# Patient Record
Sex: Male | Born: 1965 | Race: White | Hispanic: No | Marital: Single | Smoking: Current every day smoker
Health system: Southern US, Community
[De-identification: ages and names within clinical notes are randomized; demographics above are authoritative.]

## PROBLEM LIST (undated history)

## (undated) DIAGNOSIS — E119 Type 2 diabetes mellitus without complications: Secondary | ICD-10-CM

## (undated) DIAGNOSIS — J9811 Atelectasis: Secondary | ICD-10-CM

## (undated) DIAGNOSIS — I10 Essential (primary) hypertension: Secondary | ICD-10-CM

## (undated) DIAGNOSIS — G4730 Sleep apnea, unspecified: Secondary | ICD-10-CM

## (undated) DIAGNOSIS — J189 Pneumonia, unspecified organism: Secondary | ICD-10-CM

## (undated) DIAGNOSIS — E7800 Pure hypercholesterolemia, unspecified: Secondary | ICD-10-CM

## (undated) DIAGNOSIS — K5792 Diverticulitis of intestine, part unspecified, without perforation or abscess without bleeding: Secondary | ICD-10-CM

## (undated) DIAGNOSIS — R7881 Bacteremia: Secondary | ICD-10-CM

## (undated) DIAGNOSIS — D696 Thrombocytopenia, unspecified: Secondary | ICD-10-CM

## (undated) DIAGNOSIS — K922 Gastrointestinal hemorrhage, unspecified: Secondary | ICD-10-CM

## (undated) HISTORY — PX: ABDOMINAL SURGERY: SHX537

---

## 2008-12-31 ENCOUNTER — Ambulatory Visit: Payer: Self-pay | Admitting: Urology

## 2009-03-29 ENCOUNTER — Emergency Department: Payer: Self-pay

## 2011-08-16 ENCOUNTER — Inpatient Hospital Stay: Payer: Self-pay | Admitting: Internal Medicine

## 2011-08-16 LAB — HEMOGLOBIN: HGB: 14.3 g/dL (ref 13.0–18.0)

## 2011-08-16 LAB — CBC
HCT: 48.7 % (ref 40.0–52.0)
HGB: 17.3 g/dL (ref 13.0–18.0)
MCHC: 35.5 g/dL (ref 32.0–36.0)
MCV: 90 fL (ref 80–100)
Platelet: 196 10*3/uL (ref 150–440)

## 2011-08-16 LAB — PROTIME-INR: INR: 1.1

## 2011-08-16 LAB — APTT: Activated PTT: 28.5 secs (ref 23.6–35.9)

## 2011-08-16 LAB — WBC: WBC: 8.2 10*3/uL (ref 3.8–10.6)

## 2011-08-16 LAB — BASIC METABOLIC PANEL
Anion Gap: 10 (ref 7–16)
Chloride: 106 mmol/L (ref 98–107)
Co2: 25 mmol/L (ref 21–32)
Creatinine: 0.87 mg/dL (ref 0.60–1.30)
EGFR (Non-African Amer.): >60
Potassium: 4 mmol/L (ref 3.5–5.1)
Sodium: 141 mmol/L (ref 136–145)

## 2012-11-25 ENCOUNTER — Emergency Department: Payer: Self-pay

## 2012-11-25 LAB — CBC
HCT: 49.8 % (ref 40.0–52.0)
HGB: 17.8 g/dL (ref 13.0–18.0)
MCH: 32 pg (ref 26.0–34.0)
MCHC: 35.7 g/dL (ref 32.0–36.0)
MCV: 90 fL (ref 80–100)
Platelet: 188 10*3/uL (ref 150–440)
RDW: 12.6 % (ref 11.5–14.5)

## 2012-11-25 LAB — COMPREHENSIVE METABOLIC PANEL
Alkaline Phosphatase: 68 U/L (ref 50–136)
BUN: 14 mg/dL (ref 7–18)
Bilirubin,Total: 0.4 mg/dL (ref 0.2–1.0)
Calcium, Total: 8.9 mg/dL (ref 8.5–10.1)
Chloride: 107 mmol/L (ref 98–107)
Creatinine: 1 mg/dL (ref 0.60–1.30)
EGFR (African American): >60
Glucose: 135 mg/dL — ABNORMAL HIGH (ref 65–99)
Osmolality: 284 (ref 275–301)
Potassium: 3.9 mmol/L (ref 3.5–5.1)
SGOT(AST): 22 U/L (ref 15–37)
SGPT (ALT): 37 U/L (ref 12–78)
Total Protein: 7.3 g/dL (ref 6.4–8.2)

## 2012-11-25 LAB — CK TOTAL AND CKMB (NOT AT ARMC): CK, Total: 122 U/L (ref 35–232)

## 2012-11-25 LAB — URINALYSIS, COMPLETE
Bilirubin,UR: NEGATIVE
Blood: NEGATIVE
Ph: 6 (ref 4.5–8.0)
RBC,UR: 1 /HPF (ref 0–5)
Specific Gravity: 1.026 (ref 1.003–1.030)
Squamous Epithelial: NONE SEEN
WBC UR: 1 /HPF (ref 0–5)

## 2012-11-25 LAB — TROPONIN I: Troponin-I: <0.02 ng/mL

## 2014-08-14 NOTE — Consult Note (Signed)
Brief Consult Note: Diagnosis: GI bleed.   Patient was seen by consultant.   Consult note dictated.   Orders entered.   Discussed with Attending MD.   Comments: Hematochezia, most likely diverticular, clinically resolved. ? h/o hepatitis B.  Recommendations: Continue to observe for another 24 hours for any signs of recurrent bleeding.  Hepatitis serology. Patient does not want to stay and would like to go home today. Risks of early discharge such as recurrent bleeding with associated consequences were explained to him. He understands but has decided to leave and come back to ER in case of recurrent bleeding. Otherwise, he will follow with me in 2 weeks as OP. Discussed with Dr. Seth BakeV. Thanks.  Electronic Signatures: Lurline DelIftikhar, Waqas Bruhl (MD)  (Signed 26-Apr-13 15:55)  Authored: Brief Consult Note   Last Updated: 26-Apr-13 15:55 by Lurline DelIftikhar, Lj Miyamoto (MD)

## 2014-08-14 NOTE — Consult Note (Signed)
PATIENT NAME:  Timothy Cameron, Timothy Cameron MR#:  [id number] DATE OF BIRTH:  1965/11/06  DATE OF CONSULTATION:  08/16/2011  REFERRING PHYSICIAN:  Dr. Winona LegatoVaickute  CONSULTING PHYSICIAN:  Lurline DelShaukat Theadore Blunck, MD  REASON FOR CONSULTATION: Hematochezia.   HISTORY OF PRESENT ILLNESS: 49 year old male without significant past medical history. According to the patient he was in his normal state of health until about midnight last night when he started to have some rumbling and grumbling in his stomach. He went to bathroom and passed significant amount of bright red blood. He had 4 or 5 bowel movements at home. All were reported as bloody with fresh red blood and eventually some small clots. Felt somewhat weak and dizzy. No nausea, vomiting, or hematemesis. No melena was reported. He presented to the Emergency Room a couple of hours later where his vitals were stable. According to the patient fro the last 8 to 10 hours he has not had any further bowel movements and no bleeding. According to him, he had similar episode about five years ago and underwent a colonoscopy at [facility]. He is not sure about the diagnosis at that time. He has not had any problem in between these two episodes. Patient denies using any nonsteroidals or blood thinners. No other significant symptoms were reported by the patient.   PAST MEDICAL HISTORY: History of bleeding as mentioned above in the past, otherwise quite unremarkable.   PAST SURGICAL HISTORY: Significant for some sort of abdominal surgery with [facility] the past [facility]. He is not sure of the exact nature of the problem. There is also some question of him being positive for hepatitis B, although that is not confirmed as well. Patient apparently had [facility] several years ago, but according to him nothing was said after the liver biopsy.   ALLERGIES: None.   MEDICATIONS: He takes a stool softener.   SOCIAL HISTORY: He is a Teaching laboratory techniciancar mechanic. He smokes  cigars and drinks alcohol only occasionally.    PHYSICAL EXAMINATION:  GENERAL: Well built male does not appear to be in any acute distress. Clinically does not appear to be anemic or jaundiced.  VITAL SIGNS: Heart rate is in 70s and 80s.    NECK: Neck veins are flat.   LUNGS: Clear to auscultation bilaterally with fair air entry and no added sounds.   CARDIOVASCULAR: Regular rate and rhythm. No gallops or murmur.   ABDOMEN: Soft and benign. Bowel sounds positive. Nontender, nondistended. No rebound or guarding was noted.   EXTREMITIES: No edema, clubbing, or cyanosis.   NEUROLOGIC: Examination appears to be unremarkable.   LABORATORY, DIAGNOSTIC AND RADIOLOGICAL DATA: On admission his hemoglobin was reported to be 17 probably due to hemoconcentration, with hydration his hemoglobin is still normal at 14.5. White cell count was elevated at 18,000 which was also thought to be secondary to hemoconcentration. PT-INR within normal limits. Chemistries are fine. BUN and creatinine normal.   ASSESSMENT AND PLAN: Patient with hematochezia, most likely diverticular bleeding. Clinically the bleeding seems to have resolved. He is hemodynamically stable. There are no clinical signs of active bleeding at this point. Patient's hemoglobin and hematocrit remain stable. White cell count was elevated, although this appears to be secondary to hemoconcentration as he is otherwise afebrile and abdominal examination is benign. Nuclear medicine packed RBC study was requested and done which is negative. As mentioned above, most likely we are dealing with a case of diverticular bleeding that has clinically resolved. Since patient's  last colonoscopy was more than five years ago I would recommend a repeat colonoscopy after recovery from this current episode. I would recommend that we continue to observe him overnight for any signs of recurrent bleeding and follow his hemoglobin and hematocrit. Patient is adamant about  going home today. The risk of going home too early such as recurrent bleeding which can sometimes be catastrophic were discussed with him in detail. He is in full understanding but has made a decision to go home. Patient promises that he will return to the Emergency Room at the earliest sign of any recurrent bleeding and will follow with me in two weeks if there is no further bleeding for further work-up. There is questionable history of hepatitis B in the past. This will be further evaluated by repeating his serologies today. Patient will follow back with me in two weeks. Orders have been written. Case has been discussed with Dr. Winona Legato and Dr. Winona Legato will decide about his discharge after discussing it further with the patient.   Thank you so much Dr. Winona Legato for involving me in the care of Timothy Cameron.   ____________________________ Lurline Del, MD si:cms D: 08/16/2011 16:01:54 ET T: 08/16/2011 17:02:14 ET JOB#: [id number]  cc: Lurline Del, MD, <Dictator>  Lurline Del MD ELECTRONICALLY SIGNED 08/17/2011 20:42

## 2014-08-14 NOTE — H&P (Signed)
PATIENT NAME:  Timothy Cameron, Timothy Cameron MR#:  [id number] DATE OF BIRTH:  1966/01/30  DATE OF ADMISSION:  08/16/2011  PRIMARY CARE PHYSICIAN: None.   CHIEF COMPLAINT: Bleeding per rectum.  HISTORY OF PRESENT ILLNESS: The patient is a 49 year old male with no significant past medical history. He presented with bright red bleeding per rectum since 12:00 midnight last night. He said initially it was like a profuse bleeding going on with blood clots, but now the bleeding has dwindled a little bit. He had about 7 to 8 bowel movements, mainly blood filling up the commode, since last night. He denies any abdominal pain. He denies any nausea, vomiting, or hematemesis. He denies any bleeding from any other site. He was feeling weak and dizzy. He felt as if he was going to pass out. There was mainly blood coming out, no stool with the bowel movement. He said he had a similar episode one time in the past about five years ago when he was admitted at [facility]. The patient does not remember what they told him the cause of bleed was. He said they did a colonoscopy and possibly cauterized it. He was feeling weak and dizzy as if he was going to pass out. He denies any chest pain or shortness of breath. No fever. So, he is being admitted for rectal bleed.   REVIEW OF SYSTEMS: CONSTITUTIONAL:  No fever but positive for weakness. HEENT: No acute change in vision. No headache. He is complaining of dizziness with the bleed going on. RESPIRATORY: No cough. No dyspnea. CARDIOVASCULAR:  No chest pain. GASTROINTESTINAL: No nausea or vomiting. No diarrhea. No abdominal pain, mainly hematochezia.  No melena, no hematemesis. GENITOURINARY: No dysuria. No frequency. ENDOCRINE: No thyroid problems. HEMATOLOGIC: No anemia. MUSCULOSKELETAL: No joint pains or swelling. NEUROLOGIC: No focal numbness or weakness. PSYCHIATRIC: No anxiety.   PAST MEDICAL HISTORY: History of rectal bleed in the past, he says about five years ago when he was admitted  to [facility].   PAST SURGICAL HISTORY: He had abdominal surgery with colostomy in the past, also at [facility], suspect bowel obstruction with perforation, but he is not sure of the reason of his abdominal surgery.   ALLERGIES TO MEDICATIONS: None.   HOME MEDICATIONS: He takes a stool softener, four of them at nighttime.   SOCIAL HISTORY: He works as a Teaching laboratory technician. He smokes cigars. Occasional alcohol use. No drug use.   FAMILY HISTORY: His grandfather had myocardial infarction in 58s.   PHYSICAL EXAMINATION:  VITAL SIGNS: Temperature of 98, heart rate 114, respiratory 18, blood pressure 144/85, saturating 97% on room air.   GENERAL: This is a young obese Caucasian male comfortably lying in bed, no acute distress.   HEENT: Bilateral pupils are equal. Extraocular muscles are intact. No scleral icterus. No conjunctivitis. Oral mucosa is moist. No pallor.   NECK: No thyroid tenderness, enlargement or nodule. Neck is supple. No masses, nontender. No adenopathy. No JVD. No carotid bruit.   CHEST: Bilateral breath sounds are clear. No wheeze. Normal effort. No respiratory distress.   HEART: Heart sounds are regular. No murmur. Good peripheral pulses. No lower extremity edema.   ABDOMEN: Abdomen appears to be soft. No appreciable tenderness. Normal bowel sounds. No hepatosplenomegaly. No bruit. No masses.   RECTAL: Exam is deferred.   NEUROLOGIC: He is awake, alert, oriented to time, place, and person. Cranial nerves are intact. Moving all extremities against gravity.   EXTREMITIES: No cyanosis. No clubbing.   SKIN:  At the tips of his fingers he has burn marks, mainly  because he is a Teaching laboratory techniciancar mechanic. No petechia, no purpura, no ecchymosis.   LABORATORY, DIAGNOSTIC AND RADIOLOGICAL DATA:  White count 18.2, hemoglobin of 17.3, platelet count of 48.7.  BMP: Sodium 141, potassium 4, BUN 14, creatinine 0.8, glucose is 131.  He has been typed and screened already. His INR is 1.1.    IMPRESSION:  1. Lower GI bleed, differential includes secondary to arteriovenous malformations versus polyp versus diverticular. 2. Dizziness, presyncope, secondary to rectal bleed.  3. Leukocytosis.  PLAN: A 49 year old male who is a smoker. He presented with: 1. Bright red bleeding per rectum since last night: He had 7 to 8 bowel movements, mainly bloody. No abdominal pain. No nausea, vomiting, feeling weak and dizzy with the rectal bleed. His blood pressure is stable at this time. Hemoglobin is stable. He said he had a similar episode one time in the past. The way he explains it, they did a colonoscopy and they cauterized it. It looks like he could have an arteriovenous malformation. We are going to check serial hemoglobin on him, give him IV hydration. I am going to start with a nuclear medicine bleeding scan. That will tell us if there is any active bleeding or not and maybe an approximate site of the bleeding. GI will be consulted. I already discussed the case with Dr. Niel HummerIftikhar. He has already been typed and screened. His INR and platelet count are normal.  2. Leukocytosis: May be secondary to rectal bleed. It does not appear to be infectious.   TIME SPENT:  Time spent with admission and coordination of care was 45 minutes.    ____________________________ Fredia SorrowAbhinav Hani Campusano, MD ag:cbb D: 08/16/2011 06:52:18 ET T: 08/16/2011 09:57:44 ET JOB#: [id number]  cc: Fredia SorrowAbhinav Detric Scalisi, MD, <Dictator> Fredia SorrowABHINAV Lacrecia Delval MD ELECTRONICALLY SIGNED 09/06/2011 17:09

## 2014-08-14 NOTE — Discharge Summary (Signed)
PATIENT NAME:  Timothy Cameron, Timothy Cameron MR#:  [id number] DATE OF BIRTH:  [birth year]  DATE OF ADMISSION:  08/16/2011 DATE OF DISCHARGE:  08/16/2011  ADMITTING DIAGNOSIS: Rectal bleed.  DISCHARGE DIAGNOSES:  1. Rectal bleed, subsided, likely diverticular per Gastroenterology. 2. Leukocytosis. 3. Hyperglycemia in nonfasting specimen. 4. Dizziness likely related to rectal bleed.    DISCHARGE CONDITION: Stable.   DISCHARGE MEDICATIONS: The patient is to resume his Dulcolax orally as needed.   ADDITIONAL MEDICATIONS: Senokot 1 tablet p.o. daily.  DIET: Low residue, soft for approximately 3 to 4 days, then the patient was advised to advance to regular diet as tolerated.   ACTIVITY LIMITATIONS: As tolerated.    FOLLOW-UP: Follow-up with Dr. Niel HummerIftikhar two weeks after discharge.   CONSULTANT: Dr. Niel HummerIftikhar    RADIOLOGIC STUDIES:  1. Chest, portable, single view, 08/16/2011, showed no acute cardiopulmonary disease. Small nodular density adjacent to left hilum felt to be artifactual, however, follow-up PA and lateral was recommended. 2. GI blood loss study Nuclear Medicine 08/16/2011 no significant evidence of active gastrointestinal hemorrhage noted.   REASON FOR ADMISSION: The patient is a 49 year old male with past medical history significant for history of rectal bleed in the past approximately five years ago when he was admitted to [facility]. He presented to the hospital with complaints of bleeding per rectum. Please refer to Dr. Aundria MemsAbhinav Gupta's admission note on 08/16/2011. Apparently the patient had right red bleeding per rectum since midnight on day of admission. It was quite profuse and going on with clots and then dwindled a little bit. He had a few episodes of rectal bleed. He had no nausea, vomiting, or hematemesis.   PHYSICAL EXAMINATION: His vital signs on date of admission included temperature 98, heart rate 114, respiration rate 18, blood pressure 144/85, saturation was 97% on room  air. Physical exam was unremarkable.   LAB DATA: Elevated glucose to 131. White blood cell count was elevated to 18.2. Hemoglobin was 17.3. Platelet count was 196. Coagulation panel was unremarkable.   HOSPITAL COURSE: The patient was admitted to the hospital for further evaluation. He was consulted by Dr. Niel HummerIftikhar who felt that the patient's rectal bleed, hematochezia, was likely diverticular and resolved now. He was also questioning the patient's history of Hepatitis B. He recommended to observe the patient for any signs of recurrent bleeding. However, the patient did not want to stay and wanted to go home. Risks of early discharge such as recurrent bleeding and associated concequences were explained to him and he understood but decided to leave and come back to the Emergency Room in case of recurrent bleeding. He was recommended to continue low residue diet and advance it very slowly to a regular diet over the next one week He was to return to the hospital if he has recurrent bleed. His vital signs were stable on dc home with temperature of 98.2, pulse 70, respiration rate 20, blood pressure 111/72, saturation 98% on room air at rest.   The patient's hemoglobin was checked periodically while he was in the hospital and it was stable. It decreased from his admission hemoglobin level from 17.3 to 14.3. However, admission hemoglobin level was felt to be to due to severe hemoconcentration.   The patient was complaining of possible Hepatitis B. Dr. Niel HummerIftikhar felt that the patient needs to be evaluated for Hepatitis B, however, those results were not available by the time of discharge. Now Hepatitis B surface antibody was checked and was found to be less than 0.1  which was inconsistent with immunity. Hepatitis B surface antigen was also checked and was found to be negative. Hepatitis C virus antibody reflex PCR was also checked and was found to be high at more than 11. The sample was diluted in order to obtain  Hepatitis C viral load due to insufficient specimen volume and has also reported after dilution. Hepatitis C virus antibody screen was positive and had presence of Hepatitis C virus RNA and consistent with active infection The patient will be following up with Dr. Niel Hummer for further recommendations after discharge.  In regards to leukocytosis, the patient's leukocytosis resolved and was felt to be probably stress related.   In regards to hyperglycemia, the patient should be checked as outpatient for hyperglycemia or even diabetes. He is to find a primary physician and be checked for that.   DISPOSITION: He is being discharged in stable condition with the above-mentioned medications and follow-up.   TIME SPENT: 40 minutes.   ____________________________ Katharina Caper, MD rv:drc D: 08/23/2011 20:31:50 ET T: 08/24/2011 10:50:48 ET JOB#: [id number]  cc: Katharina Caper, MD, <Dictator> Lurline Del, MD Elzia Hott MD ELECTRONICALLY SIGNED 08/31/2011 14:13

## 2016-03-08 ENCOUNTER — Observation Stay
Admission: EM | Admit: 2016-03-08 | Discharge: 2016-03-10 | Disposition: A | Discharge status: Other Acute Inpatient Hospital | Payer: Self-pay | Attending: Internal Medicine | Admitting: Internal Medicine

## 2016-03-08 ENCOUNTER — Encounter: Payer: Self-pay | Admitting: Emergency Medicine

## 2016-03-08 DIAGNOSIS — F17290 Nicotine dependence, other tobacco product, uncomplicated: Secondary | ICD-10-CM | POA: Insufficient documentation

## 2016-03-08 DIAGNOSIS — K5730 Diverticulosis of large intestine without perforation or abscess without bleeding: Secondary | ICD-10-CM | POA: Insufficient documentation

## 2016-03-08 DIAGNOSIS — D62 Acute posthemorrhagic anemia: Secondary | ICD-10-CM

## 2016-03-08 DIAGNOSIS — E119 Type 2 diabetes mellitus without complications: Secondary | ICD-10-CM

## 2016-03-08 DIAGNOSIS — E1165 Type 2 diabetes mellitus with hyperglycemia: Secondary | ICD-10-CM | POA: Insufficient documentation

## 2016-03-08 DIAGNOSIS — E1169 Type 2 diabetes mellitus with other specified complication: Secondary | ICD-10-CM

## 2016-03-08 DIAGNOSIS — Z79899 Other long term (current) drug therapy: Secondary | ICD-10-CM | POA: Insufficient documentation

## 2016-03-08 DIAGNOSIS — F1010 Alcohol abuse, uncomplicated: Secondary | ICD-10-CM | POA: Insufficient documentation

## 2016-03-08 DIAGNOSIS — D72829 Elevated white blood cell count, unspecified: Secondary | ICD-10-CM | POA: Insufficient documentation

## 2016-03-08 DIAGNOSIS — K921 Melena: Principal | ICD-10-CM | POA: Insufficient documentation

## 2016-03-08 DIAGNOSIS — D696 Thrombocytopenia, unspecified: Secondary | ICD-10-CM

## 2016-03-08 DIAGNOSIS — Z980 Intestinal bypass and anastomosis status: Secondary | ICD-10-CM | POA: Insufficient documentation

## 2016-03-08 DIAGNOSIS — K922 Gastrointestinal hemorrhage, unspecified: Secondary | ICD-10-CM | POA: Diagnosis present

## 2016-03-08 DIAGNOSIS — E871 Hypo-osmolality and hyponatremia: Secondary | ICD-10-CM | POA: Insufficient documentation

## 2016-03-08 DIAGNOSIS — Q439 Congenital malformation of intestine, unspecified: Secondary | ICD-10-CM | POA: Insufficient documentation

## 2016-03-08 DIAGNOSIS — Z716 Tobacco abuse counseling: Secondary | ICD-10-CM

## 2016-03-08 LAB — COMPREHENSIVE METABOLIC PANEL
ALBUMIN: 4.4 g/dL (ref 3.5–5.0)
ALT: 63 U/L (ref 17–63)
ANION GAP: 9 (ref 5–15)
AST: 37 U/L (ref 15–41)
Alkaline Phosphatase: 59 U/L (ref 38–126)
BUN: 8 mg/dL (ref 6–20)
CHLORIDE: 103 mmol/L (ref 101–111)
CO2: 22 mmol/L (ref 22–32)
Calcium: 9.1 mg/dL (ref 8.9–10.3)
Creatinine, Ser: 0.78 mg/dL (ref 0.61–1.24)
GFR calc Af Amer: >60 mL/min (ref >60)
GLUCOSE: 202 mg/dL — AB (ref 65–99)
POTASSIUM: 3.8 mmol/L (ref 3.5–5.1)
Sodium: 134 mmol/L — ABNORMAL LOW (ref 135–145)
TOTAL PROTEIN: 7.2 g/dL (ref 6.5–8.1)
Total Bilirubin: 0.6 mg/dL (ref 0.3–1.2)

## 2016-03-08 LAB — PROTIME-INR
INR: 1.09
PROTHROMBIN TIME: 14.1 s (ref 11.4–15.2)

## 2016-03-08 LAB — HEMOGLOBIN
HEMOGLOBIN: 13.3 g/dL (ref 13.0–18.0)
Hemoglobin: 15.2 g/dL (ref 13.0–18.0)

## 2016-03-08 LAB — TYPE AND SCREEN
ABO/RH(D): A POS
Antibody Screen: NEGATIVE

## 2016-03-08 LAB — CBC
HCT: 47.8 % (ref 40.0–52.0)
Hemoglobin: 16.5 g/dL (ref 13.0–18.0)
MCH: 31.6 pg (ref 26.0–34.0)
MCHC: 34.5 g/dL (ref 32.0–36.0)
MCV: 91.7 fL (ref 80.0–100.0)
PLATELETS: 167 10*3/uL (ref 150–440)
RBC: 5.21 MIL/uL (ref 4.40–5.90)
RDW: 12.2 % (ref 11.5–14.5)
WBC: 12.3 10*3/uL — AB (ref 3.8–10.6)

## 2016-03-08 LAB — APTT: aPTT: 27 seconds (ref 24–36)

## 2016-03-08 MED ORDER — SENNOSIDES-DOCUSATE SODIUM 8.6-50 MG PO TABS: 1.0000 | ORAL_TABLET | Freq: Every evening | ORAL | Status: DC | PRN

## 2016-03-08 MED ORDER — PEG 3350-KCL-NA BICARB-NACL 420 G PO SOLR
4000.0000 mL | Freq: Once | ORAL | Status: AC
Start: 2016-03-08 — End: 2016-03-08
  Administered 2016-03-08: 20:00:00 4000 mL via ORAL
  Filled 2016-03-08: qty 4000

## 2016-03-08 MED ORDER — ACETAMINOPHEN 325 MG PO TABS: 650.0000 mg | ORAL_TABLET | Freq: Four times a day (QID) | ORAL | Status: DC | PRN

## 2016-03-08 MED ORDER — SODIUM CHLORIDE 0.9 % IV BOLUS (SEPSIS)
1000.0000 mL | Freq: Once | INTRAVENOUS | Status: AC
  Administered 2016-03-08: 1000 mL via INTRAVENOUS

## 2016-03-08 MED ORDER — ACETAMINOPHEN 650 MG RE SUPP: 650.0000 mg | Freq: Four times a day (QID) | RECTAL | Status: DC | PRN

## 2016-03-08 MED ORDER — SODIUM CHLORIDE 0.9 % IV SOLN
INTRAVENOUS | Status: DC
  Administered 2016-03-08 – 2016-03-09 (×2): via INTRAVENOUS

## 2016-03-08 MED ORDER — ONDANSETRON HCL 4 MG/2ML IJ SOLN: 4.0000 mg | Freq: Four times a day (QID) | INTRAMUSCULAR | Status: DC | PRN

## 2016-03-08 MED ORDER — ONDANSETRON HCL 4 MG PO TABS: 4.0000 mg | ORAL_TABLET | Freq: Four times a day (QID) | ORAL | Status: DC | PRN

## 2016-03-08 MED ORDER — HYDROCODONE-ACETAMINOPHEN 5-325 MG PO TABS
1.0000 | ORAL_TABLET | ORAL | Status: DC | PRN
  Administered 2016-03-09: 12:00:00 2 via ORAL
  Filled 2016-03-08: qty 2

## 2016-03-08 NOTE — Progress Notes (Signed)
Called DR Juliene PinaMody about  bloody discharge from rectum hemoglobin of 15.2.  Per Mody no new orders, do not need to call her unless is dropping or vital signs are unstable

## 2016-03-08 NOTE — Progress Notes (Signed)
CCMD called to notify this nurse of a 2.13 second pause with HR back up to 55. Patient was symptomatic, states that he was up to have BM and after BM, he felt very weak and reports seeing spots. BP 99/65 HR 83 Oxygen 97% on RA. Last Hgb 13.3 at 1944. Patient has almost completed colonoscopy prep and continues to pass liquid bloody stool.  MD paged, awaiting return call.

## 2016-03-08 NOTE — ED Triage Notes (Signed)
Pt reports bright red rectal bleeding that began approximately 0400 today. Pt reports has to take stool softeners daily but denies knowledge of hemorrhoids. Pt reports feeling the need to have BM in triage.

## 2016-03-08 NOTE — H&P (Signed)
[employer] [facility] [place] [facility]   PATIENT NAME: Timothy RudMichael Gravois    MR#:  [id number]  DATE OF BIRTH:  08-16-1965  DATE OF ADMISSION:  03/08/2016  PRIMARY CARE PHYSICIAN: No PCP Per Patient   REQUESTING/REFERRING PHYSICIAN: Dr Fanny BienQuale  CHIEF COMPLAINT:   BRBPR HISTORY OF PRESENT ILLNESS:  Timothy Cameron  is a 50 y.o. male with a known history of Previous bowel resection due to GI bleeding about 8 years ago who presents with above complaint. Patient reports since late Thursday night he has had several episodes of bright red blood per rectum. He had 3 episodes of bright red blood per rectum while in the emergency room. He denies loss of consciousness, dizziness, lightheadedness or chest pain. He denies shortness of breath. He denies abdominal pain. He does not take NSAIDs on a daily basis. He has no history of upper GI bleed. He does state that he is feeling weak.  PAST MEDICAL HISTORY:  History of GI bleeding 8 years ago  PAST SURGICAL HISTORY:  Abdominal surgery with colostomy in the past  SOCIAL HISTORY:   Smokes 8 cigars a day and drinks 3 cans of beer every night  FAMILY HISTORY:  Father with high blood pressure and mother with cancer  DRUG ALLERGIES:  No Known Allergies  REVIEW OF SYSTEMS:   Review of Systems  Constitutional: Negative for chills, fever and malaise/fatigue.  HENT: Negative.  Negative for ear discharge, ear pain, hearing loss, nosebleeds and sore throat.   Eyes: Negative.  Negative for blurred vision and pain.  Respiratory: Negative.  Negative for cough, hemoptysis, shortness of breath and wheezing.   Cardiovascular: Negative.  Negative for chest pain, palpitations and leg swelling.  Gastrointestinal: Positive for blood in stool. Negative for abdominal pain, diarrhea, nausea and vomiting.  Genitourinary: Negative.  Negative for dysuria.  Musculoskeletal: Negative.  Negative for back pain.  Skin: Negative.   Neurological: Positive for  weakness. Negative for dizziness, tremors, speech change, focal weakness, seizures and headaches.  Endo/Heme/Allergies: Negative.  Does not bruise/bleed easily.  Psychiatric/Behavioral: Negative.  Negative for depression, hallucinations and suicidal ideas.    MEDICATIONS AT HOME:   Prior to Admission medications   Medication Sig Start Date End Date Taking? Authorizing Provider  psyllium (METAMUCIL) 58.6 % packet Take 1 packet by mouth at bedtime.   Yes Historical Provider, MD  senna-docusate (SENOKOT-S) 8.6-50 MG tablet Take 3 tablets by mouth at bedtime.   Yes Historical Provider, MD      VITAL SIGNS:  Blood pressure (!) 142/91, pulse 75, temperature 97.9 F (36.6 C), temperature source Oral, resp. rate 18, height 5\' 11"  (1.803 m), weight 99.8 kg (220 lb), SpO2 96 %.  PHYSICAL EXAMINATION:   Physical Exam  Constitutional: He is oriented to person, place, and time and well-developed, well-nourished, and in no distress. No distress.  HENT:  Head: Normocephalic.  Eyes: No scleral icterus.  Neck: Normal range of motion. Neck supple. No JVD present. No tracheal deviation present.  Cardiovascular: Normal rate, regular rhythm and normal heart sounds.  Exam reveals no gallop and no friction rub.   No murmur heard. Pulmonary/Chest: Effort normal and breath sounds normal. No respiratory distress. He has no wheezes. He has no rales. He exhibits no tenderness.  Abdominal: Soft. Bowel sounds are normal. He exhibits no distension and no mass. There is no tenderness. There is no rebound and no guarding.  Musculoskeletal: Normal range of motion. He exhibits no edema.  Neurological: He is alert  and oriented to person, place, and time.  Skin: Skin is warm. No rash noted. No erythema.  Psychiatric: Affect and judgment normal.      LABORATORY PANEL:   CBC  Recent Labs Lab 03/08/16 0827  WBC 12.3*  HGB 16.5  HCT 47.8  PLT 167    ------------------------------------------------------------------------------------------------------------------  Chemistries   Recent Labs Lab 03/08/16 0827  NA 134*  K 3.8  CL 103  CO2 22  GLUCOSE 202*  BUN 8  CREATININE 0.78  CALCIUM 9.1  AST 37  ALT 63  ALKPHOS 59  BILITOT 0.6   ------------------------------------------------------------------------------------------------------------------  Cardiac Enzymes No results for input(s): TROPONINI in the last 168 hours. ------------------------------------------------------------------------------------------------------------------  RADIOLOGY:  No results found.  EKG:     IMPRESSION AND PLAN:   50 year old male with a remote history of GI bleed who presents with a red blood per rectum.  1. Bright red blood per rectum: GI has been contacted via ER physician. Clear liquid diet. Further management as per GI. Follow-up hemoglobin every 6 hours. Transfuse if hemoglobin less than 7 or hemodynamic instability.  2. Tobacco dependence: Patient is encouraged to stop smoking cigars. Patient reports that he wants to quit smoking and has used nicotine patches in the past.  3. EtOH abuse: Patient reports he has not gone through withdrawal. Monitor for need for CIWA protocol  4. Hyponatremia: This is due to GI bleed with volume loss. IV fluids and repeat in a.m.    All the records are reviewed and case discussed with ED provider. Management plans discussed with the patient and he in agreement  CODE STATUS: FULL  TOTAL TIME TAKING CARE OF THIS PATIENT: 45 minutes.    Prudence Heiny M.D on 03/08/2016 at 10:33 AM  Between 7am to 6pm - Pager - [phone]  After 6pm go to [email or web address] - Social research officer, governmentpassword [email or web address] [email or web address]  [facility]  Office  [phone]  CC: Primary care physician; No PCP Per Patient

## 2016-03-08 NOTE — Consult Note (Signed)
Timothy Miniumarren Ragan Duhon, MD [facility]  [address] [place], [place] [address] Phone: [phone] Fax : [phone]  Consultation  Referring Provider:     No ref. provider found Primary Care Physician:  No PCP Per Patient Primary Gastroenterologist:  None         Reason for Consultation:     Hematochezia  Date of Admission:  03/08/2016 Date of Consultation:  03/08/2016         HPI:   Timothy IhaMichael W Cameron is a 50 y.o. male Comes in with rectal bleeding.  The patient reports that his rectal bleeding started on the day before admission.  He has had several episodes of bright red blood that he states is large in volume.  Despite this the patient's hemoglobin on admission was normal. The patient also reports that he has had GI bleeding the past and had an obstruction of his colon with removal of part of his colon.  The patient is not sure who did his last colonoscopy or who did his surgery.  There is no report of any unexplained weight loss, fevers, chills, nausea or vomiting.  He denies any abdominal pain associated with his rectal bleeding.  The patient also denies taking any blood thinners or anti-inflammatory medications.  Now being asked to see the patient for his lower GI bleeding.  History reviewed. No pertinent past medical history.  Past Surgical History:  Procedure Laterality Date  . ABDOMINAL SURGERY      Prior to Admission medications   Medication Sig Start Date End Date Taking? Authorizing Provider  psyllium (METAMUCIL) 58.6 % packet Take 1 packet by mouth at bedtime.   Yes Historical Provider, MD  senna-docusate (SENOKOT-S) 8.6-50 MG tablet Take 3 tablets by mouth at bedtime.   Yes Historical Provider, MD    History reviewed. No pertinent family history.   Social History  Substance Use Topics  . Smoking status: Current Every Day Smoker    Packs/day: 2.00    Years: 5.00    Types: Cigars  . Smokeless tobacco: Never Used  . Alcohol use 15.6 oz/week    26 Cans of beer per week      Allergies as of 03/08/2016  . (No Known Allergies)    Review of Systems:    All systems reviewed and negative except where noted in HPI.   Physical Exam:  Vital signs in last 24 hours: Temp:  [97.9 F (36.6 C)-98.1 F (36.7 C)] 98.1 F (36.7 C) (11/17 1349) Pulse Rate:  [73-98] 78 (11/17 1802) Resp:  [15-18] 18 (11/17 1349) BP: (128-165)/(82-97) 137/89 (11/17 1802) SpO2:  [96 %-99 %] 99 % (11/17 1349) Weight:  [220 lb (99.8 kg)] 220 lb (99.8 kg) (11/17 0758)   General:   Pleasant, cooperative in NAD Head:  Normocephalic and atraumatic. Eyes:   No icterus.   Conjunctiva pink. PERRLA. Ears:  Normal auditory acuity. Neck:  Supple; no masses or thyroidomegaly Lungs: Respirations even and unlabored. Lungs clear to auscultation bilaterally.   No wheezes, crackles, or rhonchi.  Heart:  Regular rate and rhythm;  Without murmur, clicks, rubs or gallops Abdomen:  Soft, nondistended, nontender. Normal bowel sounds. No appreciable masses or hepatomegaly.  No rebound or guarding.  Rectal:  Not performed. Msk:  Symmetrical without gross deformities.   Extremities:  Without edema, cyanosis or clubbing. Neurologic:  Alert and oriented x3;  grossly normal neurologically. Skin:  Intact without significant lesions or rashes. Cervical Nodes:  No significant cervical adenopathy. Psych:  Alert and cooperative.  Normal affect.  LAB RESULTS:  Recent Labs  03/08/16 0827 03/08/16 1416  WBC 12.3*  --   HGB 16.5 15.2  HCT 47.8  --   PLT 167  --    BMET  Recent Labs  03/08/16 0827  NA 134*  K 3.8  CL 103  CO2 22  GLUCOSE 202*  BUN 8  CREATININE 0.78  CALCIUM 9.1   LFT  Recent Labs  03/08/16 0827  PROT 7.2  ALBUMIN 4.4  AST 37  ALT 63  ALKPHOS 59  BILITOT 0.6   PT/INR  Recent Labs  03/08/16 0827  LABPROT 14.1  INR 1.09    STUDIES: No results found.    Impression / Plan:   [name] is a 50 y.o. y/o male with Hematochezia and a history of  diverticulosis.  The patient has had a colon resection for unknown reasons.  The patient states that he had a blockage.  The patient likely has diverticular bleeding versus hemorrhoidal bleeding although this is less likely.  The patient will be given a prep tonight and will have his colonoscopy done in the morning.  The patient has been explained the plan and agrees with it.   Thank you for involving me in the care of this patient.      LOS: 0 days   Jamaira Sherk, MD  03/08/2016, 7:27 PM   Note: This dictation was prepared with Dragon dictation along with smaller phrase technology. Any transcriptional errors that result from this process are unintentional.  

## 2016-03-08 NOTE — ED Provider Notes (Signed)
[facility] Emergency Department Provider Note   ____________________________________________   First MD Initiated Contact with Patient 03/08/16 [phone]     (approximate)  I have reviewed the triage vital signs and the nursing notes.   HISTORY  Chief Complaint Rectal Bleeding   HPI Timothy Cameron is a 50 y.o. male reports previous bowel resection due to GI bleeding. Sensory the exact cause, denies any stomach ulcers. He also had an episode a few years ago where he had bleeding and passed out.  This morning early the morning he noted that he hadn't get up with a feeling to defecate, had a bowel movement that noted some blood, then about an hour later same symptoms and then had a grossly bloody bowel movement. He's had a total of about 4-5 grossly bloody stools, the last one being just after arriving to his ER room.  He has had no pain. He takes no medicine other than stool softener. No nausea or vomiting. Denies any pain around the rectum. Takes no blood thinners  Lightheadedness with standing today  History reviewed. No pertinent past medical history.  There are no active problems to display for this patient.   No past surgical history on file.  Prior to Admission medications   Not on File    Allergies Patient has no known allergies.  No family history on file.  Social History Social History  Substance Use Topics  . Smoking status: Not on file  . Smokeless tobacco: Not on file  . Alcohol use Not on file  Smokes occasionally Denies alcohol abuse Denies drug abuse  Review of Systems Constitutional: No fever/chills Eyes: No visual changes. ENT: No sore throat. Cardiovascular: Denies chest pain. Respiratory: Denies shortness of breath. Gastrointestinal: No abdominal pain.  No nausea, no vomiting.   No constipation. Genitourinary: Negative for dysuria. Musculoskeletal: Negative for back pain. Skin: Negative for rash. Neurological:  Negative for headaches, focal weakness or numbness.  10-point ROS otherwise negative.  ____________________________________________   PHYSICAL EXAM:  VITAL SIGNS: ED Triage Vitals  Enc Vitals Group     BP 03/08/16 0800 (!) 165/93     Pulse Rate 03/08/16 0800 98     Resp 03/08/16 0800 18     Temp 03/08/16 0800 97.9 F (36.6 C)     Temp Source 03/08/16 0800 Oral     SpO2 03/08/16 0800 98 %     Weight 03/08/16 0758 220 lb (99.8 kg)     Height 03/08/16 0758 5\' 11"  (1.803 m)     Head Circumference --      Peak Flow --      Pain Score --      Pain Loc --      Pain Edu? --      Excl. in GC? --     Constitutional: Alert and oriented. Well appearing and in no acute distress. Eyes: Conjunctivae are normal. PERRL. EOMI. Head: Atraumatic. Nose: No congestion/rhinnorhea. Mouth/Throat: Mucous membranes are moist.  Oropharynx non-erythematous. Neck: No stridor.   Cardiovascular: Normal rate, regular rhythm. Grossly normal heart sounds.  Good peripheral circulation. Respiratory: Normal respiratory effort.  No retractions. Lungs CTAB. Gastrointestinal: Soft and nontender. No distention. No discomfort. 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 Rectal exam demonstrates brownish stool with heme positive, also some slight redness in nature. No external hemorrhoid noted Musculoskeletal: No lower extremity tenderness nor edema.  No joint effusions. Neurologic:  Normal speech and language. No gross  focal neurologic deficits are appreciated. No gait instability. Skin:  Skin is warm, dry and intact. No rash noted. Psychiatric: Mood and affect are normal. Speech and behavior are normal.  ____________________________________________   LABS (all labs ordered are listed, but only abnormal results are displayed)  Labs Reviewed  CBC - Abnormal; Notable for the following:       Result Value   WBC 12.3 (*)    All other components within normal limits    COMPREHENSIVE METABOLIC PANEL - Abnormal; Notable for the following:    Sodium 134 (*)    Glucose, Bld 202 (*)    All other components within normal limits  PROTIME-INR  APTT  TYPE AND SCREEN   ____________________________________________  EKG   ____________________________________________  RADIOLOGY   ____________________________________________   PROCEDURES  Procedure(s) performed: None  Procedures  Critical Care performed: No  ____________________________________________   INITIAL IMPRESSION / ASSESSMENT AND PLAN / ED COURSE  Pertinent labs & imaging results that were available during my care of the patient were reviewed by me and considered in my medical decision making (see chart for details).  Patient transfer GI bleeding. Appears consistent with lower GI bleeding in nature. History of diverticular bleeding the past. Presently hemodynamically stable, does have evidence of blood on rectal examination. Discussed case with Dr. Servando SnareWohl, and given the patient has had for more grossly bloody stools today we will admit him for GI consultation, serial hemoglobins, and further observation to exclude ongoing significant gastrointestinal bleeding. Patient reports a previous history of having received blood transfusions during similar bleeding episodes in the past.  Discussed the patient, he is agreeable with plan for admission.  Clinical Course      ____________________________________________   FINAL CLINICAL IMPRESSION(S) / ED DIAGNOSES  Final diagnoses:  Lower GI bleed      NEW MEDICATIONS STARTED DURING THIS VISIT:  New Prescriptions   No medications on file     Note:  This document was prepared using Dragon voice recognition software and may include unintentional dictation errors.     Sharyn CreamerMark Aubriana Ravelo, MD 03/08/16 [phone]

## 2016-03-09 ENCOUNTER — Observation Stay: Payer: Self-pay | Admitting: Anesthesiology

## 2016-03-09 ENCOUNTER — Encounter
Admission: EM | Disposition: A | Discharge status: Other Acute Inpatient Hospital | Payer: Self-pay | Source: Home / Self Care | Attending: Emergency Medicine

## 2016-03-09 ENCOUNTER — Ambulatory Visit: Admit: 2016-03-09 | Payer: Self-pay | Admitting: Gastroenterology

## 2016-03-09 DIAGNOSIS — E785 Hyperlipidemia, unspecified: Secondary | ICD-10-CM

## 2016-03-09 DIAGNOSIS — D62 Acute posthemorrhagic anemia: Secondary | ICD-10-CM

## 2016-03-09 DIAGNOSIS — Z716 Tobacco abuse counseling: Secondary | ICD-10-CM

## 2016-03-09 DIAGNOSIS — D696 Thrombocytopenia, unspecified: Secondary | ICD-10-CM

## 2016-03-09 DIAGNOSIS — E1169 Type 2 diabetes mellitus with other specified complication: Secondary | ICD-10-CM

## 2016-03-09 DIAGNOSIS — E119 Type 2 diabetes mellitus without complications: Secondary | ICD-10-CM

## 2016-03-09 HISTORY — PX: COLONOSCOPY: SHX5424

## 2016-03-09 LAB — BASIC METABOLIC PANEL
ANION GAP: 4 — AB (ref 5–15)
BUN: 9 mg/dL (ref 6–20)
CALCIUM: 7.9 mg/dL — AB (ref 8.9–10.3)
CO2: 24 mmol/L (ref 22–32)
Chloride: 108 mmol/L (ref 101–111)
Creatinine, Ser: 0.78 mg/dL (ref 0.61–1.24)
Glucose, Bld: 197 mg/dL — ABNORMAL HIGH (ref 65–99)
POTASSIUM: 4.1 mmol/L (ref 3.5–5.1)
Sodium: 136 mmol/L (ref 135–145)

## 2016-03-09 LAB — CBC
HEMATOCRIT: 31.2 % — AB (ref 40.0–52.0)
Hemoglobin: 11 g/dL — ABNORMAL LOW (ref 13.0–18.0)
MCH: 32 pg (ref 26.0–34.0)
MCHC: 35.3 g/dL (ref 32.0–36.0)
MCV: 90.5 fL (ref 80.0–100.0)
Platelets: 148 10*3/uL — ABNORMAL LOW (ref 150–440)
RBC: 3.45 MIL/uL — AB (ref 4.40–5.90)
RDW: 12.3 % (ref 11.5–14.5)
WBC: 9.6 10*3/uL (ref 3.8–10.6)

## 2016-03-09 LAB — HEMOGLOBIN
HEMOGLOBIN: 11.5 g/dL — AB (ref 13.0–18.0)
HEMOGLOBIN: 8.1 g/dL — AB (ref 13.0–18.0)

## 2016-03-09 LAB — HEMOGLOBIN AND HEMATOCRIT, BLOOD
HCT: 24.6 % — ABNORMAL LOW (ref 40.0–52.0)
Hemoglobin: 8.9 g/dL — ABNORMAL LOW (ref 13.0–18.0)

## 2016-03-09 SURGERY — COLONOSCOPY
Anesthesia: General

## 2016-03-09 SURGERY — COLONOSCOPY WITH PROPOFOL
Anesthesia: Monitor Anesthesia Care

## 2016-03-09 MED ORDER — ONDANSETRON HCL 4 MG/2ML IJ SOLN
4.0000 mg | Freq: Once | INTRAMUSCULAR | Status: DC | PRN
Start: 2016-03-09 — End: 2016-03-10

## 2016-03-09 MED ORDER — MIDAZOLAM HCL 2 MG/2ML IJ SOLN
INTRAMUSCULAR | Status: DC | PRN
  Administered 2016-03-09: 1 mg via INTRAVENOUS

## 2016-03-09 MED ORDER — NICOTINE 21 MG/24HR TD PT24
21.0000 mg | MEDICATED_PATCH | Freq: Every day | TRANSDERMAL | Status: DC
  Administered 2016-03-09 – 2016-03-10 (×2): 21 mg via TRANSDERMAL
  Filled 2016-03-09 (×2): qty 1

## 2016-03-09 MED ORDER — PROPOFOL 10 MG/ML IV BOLUS
INTRAVENOUS | Status: DC | PRN
  Administered 2016-03-09: 100 mg via INTRAVENOUS

## 2016-03-09 MED ORDER — EPINEPHRINE PF 1 MG/10ML IJ SOSY
PREFILLED_SYRINGE | INTRAMUSCULAR | Status: DC | PRN
  Administered 2016-03-09: 0.1 mg via SUBCUTANEOUS

## 2016-03-09 MED ORDER — PROPOFOL 500 MG/50ML IV EMUL
INTRAVENOUS | Status: DC | PRN
  Administered 2016-03-09: 120 ug/kg/min via INTRAVENOUS

## 2016-03-09 MED ORDER — SODIUM CHLORIDE 0.9 % IV SOLN
INTRAVENOUS | Status: DC | PRN
  Administered 2016-03-09: 09:00:00 via INTRAVENOUS

## 2016-03-09 MED ORDER — PHENYLEPHRINE HCL 10 MG/ML IJ SOLN
INTRAMUSCULAR | Status: DC | PRN
  Administered 2016-03-09: 100 ug via INTRAVENOUS
  Administered 2016-03-09: 50 ug via INTRAVENOUS
  Administered 2016-03-09: 100 ug via INTRAVENOUS
  Administered 2016-03-09: 50 ug via INTRAVENOUS
  Administered 2016-03-09 (×2): 100 ug via INTRAVENOUS

## 2016-03-09 MED ORDER — NICOTINE 21 MG/24HR TD PT24: 21.0000 mg | MEDICATED_PATCH | Freq: Every day | TRANSDERMAL | 0 refills | Status: DC

## 2016-03-09 MED ORDER — FENTANYL CITRATE (PF) 100 MCG/2ML IJ SOLN: 25.0000 ug | INTRAMUSCULAR | Status: DC | PRN

## 2016-03-09 MED ORDER — SODIUM CHLORIDE 0.9 % IV BOLUS (SEPSIS)
500.0000 mL | Freq: Once | INTRAVENOUS | Status: AC
  Administered 2016-03-09: 16:00:00 500 mL via INTRAVENOUS

## 2016-03-09 NOTE — Interval H&P Note (Signed)
History and Physical Interval Note:  03/09/2016 9:17 AM  Timothy IhaMichael W Germani  has presented today for surgery, with the diagnosis of gi bleed  The various methods of treatment have been discussed with the patient and family. After consideration of risks, benefits and other options for treatment, the patient has consented to  Procedure(s): COLONOSCOPY (N/A) as a surgical intervention .  The patient's history has been reviewed, patient examined, no change in status, stable for surgery.  I have reviewed the patient's chart and labs.  Questions were answered to the patient's satisfaction.     Ortencia Askari C.

## 2016-03-09 NOTE — Anesthesia Preprocedure Evaluation (Addendum)
Anesthesia Evaluation  Patient identified by MRN, date of birth, ID band Patient awake    Reviewed: Allergy & Precautions, NPO status , Patient's Chart, lab work & pertinent test results  Airway Mallampati: II  TM Distance: >3 FB     Dental  (+) Chipped   Pulmonary Current Smoker,    Pulmonary exam normal        Cardiovascular negative cardio ROS Normal cardiovascular exam     Neuro/Psych negative neurological ROS  negative psych ROS   GI/Hepatic Neg liver ROS, Lower GI bleed   Endo/Other  negative endocrine ROS  Renal/GU negative Renal ROS  negative genitourinary   Musculoskeletal negative musculoskeletal ROS (+)   Abdominal Normal abdominal exam  (+)   Peds negative pediatric ROS (+)  Hematology negative hematology ROS (+)   Anesthesia Other Findings   Reproductive/Obstetrics                            Anesthesia Physical Anesthesia Plan  ASA: II and emergent  Anesthesia Plan: General   Post-op Pain Management:    Induction: Intravenous  Airway Management Planned: Nasal Cannula  Additional Equipment:   Intra-op Plan:   Post-operative Plan:   Informed Consent: I have reviewed the patients History and Physical, chart, labs and discussed the procedure including the risks, benefits and alternatives for the proposed anesthesia with the patient or authorized representative who has indicated his/her understanding and acceptance.   Dental advisory given  Plan Discussed with: CRNA and Surgeon  Anesthesia Plan Comments:         Anesthesia Quick Evaluation

## 2016-03-09 NOTE — Op Note (Signed)
[facility] Gastroenterology Patient Name: Kandyce RudMichael Mormile Procedure Date: 03/09/2016 8:54 AM MRN: [id number] Account #: [id number] Date of Birth: 07-13-1965 Admit Type: Inpatient Age: 50 Room: [facility] ENDO ROOM 1 Gender: Male Note Status: Finalized Procedure:            Colonoscopy Indications:          Hematochezia, Rectal bleeding Providers:            Shirley FriarVincent C. Aditri Louischarles, MD Medicines:            Propofol per Anesthesia, Monitored Anesthesia Care Complications:        No immediate complications. Procedure:            Pre-Anesthesia Assessment:                       - Prior to the procedure, a History and Physical was                        performed, and patient medications and allergies were                        reviewed. The patient's tolerance of previous                        anesthesia was also reviewed. The risks and benefits of                        the procedure and the sedation options and risks were                        discussed with the patient. All questions were                        answered, and informed consent was obtained. Prior                        Anticoagulants: The patient has taken no previous                        anticoagulant or antiplatelet agents. ASA Grade                        Assessment: II - A patient with mild systemic disease.                        After reviewing the risks and benefits, the patient was                        deemed in satisfactory condition to undergo the                        procedure.                       After obtaining informed consent, the colonoscope was                        passed under direct vision. Throughout the procedure,                        the patient's blood  pressure, pulse, and oxygen                        saturations were monitored continuously. The                        Colonoscope was introduced through the anus and                        advanced to the the cecum,  identified by appendiceal                        orifice and ileocecal valve. The colonoscopy was                        performed with difficulty due to excessive bleeding and                        a tortuous colon. Successful completion of the                        procedure was aided by straightening and shortening the                        scope to obtain bowel loop reduction and lavage. The                        patient tolerated the procedure well. The quality of                        the bowel preparation was adequate and good. The                        terminal ileum, the appendiceal orifice and the rectum                        were photographed. Findings:      The perianal exam findings include large amount of red blood on DRE.      Multiple small and large-mouthed diverticula were found in the entire       colon. There was no evidence of diverticular bleeding.      There was evidence of a prior end-to-end colo-rectal anastomosis in the       rectum. This was patent and was characterized by a hemorrhagic       appearance and visible sutures. An adherent clot was noted at one end of       the suture line with bright red blood dripping from the clot and after       epinephrine injection the bleeding slowed but did not completely stop.       The clot did not wash off despite repeated irrigation. The anastomosis       was traversed. Area was successfully injected with 5 mL of a 1:10,000       solution of epinephrine for hemostasis. Estimated blood loss was minimal.      The terminal ileum appeared normal. Impression:           - Large amount of red blood on DRE found on perianal  exam.                       - Moderate diverticulosis in the entire examined colon.                        There was no evidence of diverticular bleeding.                       - Patent end-to-end colo-rectal anastomosis,                        characterized by a hemorrhagic  appearance and visible                        sutures. Injected. Suspect ischemic ulcer at suture                        line as source of the bleeding and not amenable to                        cautery or clipping due to scar tissue and increased                        risk of perforation.                       - The examined portion of the ileum was normal.                       - No specimens collected. Recommendation:       - NPO.                       - Refer to a surgeon (may need revision of the                        anastomosis if bleeding continues).                       - Follow H/Hs closely. Question ischemic ulcer at                        suture line. Procedure Code(s):    --- Professional ---                       [phone], Colonoscopy, flexible; with control of bleeding,                        any method Diagnosis Code(s):    --- Professional ---                       K62.5, Hemorrhage of anus and rectum                       K92.1, Melena (includes Hematochezia)                       K57.30, Diverticulosis of large intestine without                        perforation or abscess without bleeding  Z98.0, Intestinal bypass and anastomosis status CPT copyright 2016 [employer]. All rights reserved. The codes documented in this report are preliminary and upon coder review may  be revised to meet current compliance requirements. Shirley Friar, MD 03/09/2016 11:11:25 AM This report has been signed electronically. Number of Addenda: 0 Note Initiated On: 03/09/2016 8:54 AM Scope Withdrawal Time: 0 hours 55 minutes 43 seconds  Total Procedure Duration: 1 hour 1 minute 6 seconds       [facility]

## 2016-03-09 NOTE — Discharge Summary (Signed)
[facility] - [place] at [facility]   PATIENT NAME: Timothy RudMichael Cameron    MR#:  [id number]  DATE OF BIRTH:  08/22/1965  DATE OF ADMISSION:  03/08/2016 ADMITTING PHYSICIAN: Adrian SaranSital Mody, MD  DATE OF DISCHARGE: No discharge date for patient encounter.  PRIMARY CARE PHYSICIAN: No PCP Per Patient     ADMISSION DIAGNOSIS:  Lower GI bleed [K92.2]  DISCHARGE DIAGNOSIS:  Principal Problem:   GIB (gastrointestinal bleeding) Active Problems:   Lower GI bleed   Acute posthemorrhagic anemia   Thrombocytopenia (HCC)   Hyperglycemia   Tobacco abuse counseling   SECONDARY DIAGNOSIS:  History reviewed. No pertinent past medical history.  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 No abdominal pain was reported. No diarrheal stool. Patient underwent colonoscopy 03/09/16, which revealed suture line bleeding thought to be due to ischemic ulcer, epinephrine was injected, but cauterzation was not done due to concerns of complications, per gastroenterologist, Dr. Bosie ClosSchooler. [facility]. Liquid diet was initiated. Discussed with [facility] MD who accepted the patient for transfer. Awaiting for bed assignment. Discussion by problem: #1. Lower GI bleed felt to be due to ischemic ulcer bleeding at the suture line in the rectum, status post colonoscopy 18th of November 2017 by Dr. Bosie ClosSchooler, epinephrine was  injected but not cauterized due to concerns of perforation, gastroenterologist discussed case with the surgeon on call, Dr. Michela PitcherEly, recommended to continue clear liquid diet, [facility] bleeding does not stop with conservative therapy. Hemoglobin continued to decline and transfer was initiated  earlier than later.  #2. Acute posthemorrhagic anemia, hemoglobin level has decreased to 8.9 from 16.5 on admission, continue follow hemoglobin levels and transfuse patient if needed, discussed risks as well as benefits of transfusion, all questions answered.  #3 Thrombocytopenia, likely consumption, follow in the morning #4. Hyperglycemia, get hemoglobin A1c to rule out diabetes #5. Leukocytosis, resolved #6. Tobacco abuse counseling, discussed with patient for 3 minutes, nicotine replacement therapy is  initiated DISCHARGE CONDITIONS:   stable  CONSULTS OBTAINED:  Treatment Team:  Midge Miniumarren Wohl, MD  DRUG ALLERGIES:  No Known Allergies  DISCHARGE MEDICATIONS:   Current Discharge Medication List    START taking these medications   Details  nicotine (NICODERM CQ - DOSED IN MG/24 HOURS) 21 mg/24hr patch Place 1 patch (21 mg total) onto the skin daily. Qty: 28 patch, Refills: 0      CONTINUE these medications which have NOT CHANGED   Details  psyllium (METAMUCIL) 58.6 % packet Take 1 packet by mouth at bedtime.    senna-docusate (SENOKOT-S) 8.6-50 MG tablet Take 3 tablets by mouth at bedtime.         DISCHARGE INSTRUCTIONS:    The patient is to follow up with PCP  If you experience worsening of your admission symptoms, develop shortness of breath, life threatening emergency, suicidal or homicidal thoughts you must seek medical attention immediately by calling 911 or calling your MD immediately  if symptoms less severe.  You Must read complete instructions/literature along with all the possible adverse reactions/side effects for all the Medicines you take and that have been prescribed to you. Take any new Medicines after you have completely understood and accept all the possible adverse reactions/side effects.  Please note  You were cared for by a hospitalist during your hospital stay. If you have any questions about your discharge medications or the care you received while  you were in the hospital after you are discharged, you can call the unit and asked to speak with the hospitalist on call if the hospitalist that took care of you is not available. Once you are discharged, your primary care physician will handle any further medical issues. Please note that NO REFILLS for any discharge medications will be authorized once you are discharged, as it is imperative that you return to your primary care physician (or establish a relationship with a primary care physician if you do not have one) for your aftercare needs so that they can reassess your need for medications and monitor your lab values.    Today   CHIEF COMPLAINT:   Chief Complaint  Patient presents with  . Rectal Bleeding    HISTORY OF PRESENT ILLNESS:  Timothy Cameron  is a 50 y.o. 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 No abdominal pain was reported. No diarrheal stool. Patient underwent colonoscopy 03/09/16, which revealed suture line bleeding thought to be due to ischemic ulcer, epinephrine was injected, but cauterzation was not done due to concerns of complications, per gastroenterologist, Dr. Bosie ClosSchooler. [facility] not [facility]. Liquid diet was initiated. Discussed with [facility] MD who accepted the patient for transfer. Awaiting for bed assignment. Discussion by problem: #1. Lower GI bleed felt to be due to ischemic ulcer bleeding at the suture line in the rectum, status post colonoscopy 18th of November 2017 by Dr. Bosie ClosSchooler, epinephrine was  injected but not cauterized due to concerns of perforation, gastroenterologist discussed case with the surgeon on call, Dr. Michela PitcherEly, recommended to continue clear liquid diet, [facility] does not stop with conservative therapy. Hemoglobin continued to decline and  transfer was initiated earlier than later.  #2. Acute posthemorrhagic anemia, hemoglobin level has decreased to 8.9 from 16.5 on admission, continue follow hemoglobin levels and transfuse patient if needed, discussed risks as well as benefits of transfusion, all questions answered.  #3 Thrombocytopenia, likely consumption, follow in the morning #4. Hyperglycemia, get hemoglobin A1c to rule out diabetes #5. Leukocytosis, resolved #6. Tobacco abuse counseling, discussed with patient for 3 minutes, nicotine replacement therapy is  initiated   VITAL SIGNS:  Blood pressure 113/66, pulse 90, temperature 98.6 F (37 C), temperature source Oral, resp. rate (!) 24, height 5\' 11"  (1.803 m), weight 99.8 kg (220 lb), SpO2 98 %.  I/O:   Intake/Output Summary (Last 24 hours) at 03/09/16 1817 Last data filed at 03/09/16 0657  Gross per 24 hour  Intake          2475.01 ml  Output              300 ml  Net          2175.01 ml    PHYSICAL EXAMINATION:  GENERAL:  50 y.o.-year-old patient lying in the bed with no acute distress.  EYES: Pupils equal, round, reactive to light and accommodation. No scleral icterus. Extraocular muscles intact.  HEENT: Head atraumatic, normocephalic. Oropharynx and nasopharynx clear.  NECK:  Supple, no jugular venous distention. No thyroid enlargement, no tenderness.  LUNGS: Normal breath sounds bilaterally, no wheezing, rales,rhonchi or crepitation. No use  of accessory muscles of respiration.  CARDIOVASCULAR: S1, S2 normal. No murmurs, rubs, or gallops.  ABDOMEN: Soft, non-tender, non-distended. Bowel sounds present. No organomegaly or mass.  EXTREMITIES: No pedal edema, cyanosis, or clubbing.  NEUROLOGIC: Cranial nerves II through XII are intact. Muscle strength 5/5 in all extremities. Sensation intact. Gait not checked.  PSYCHIATRIC: The patient is alert and oriented x 3.  SKIN: No obvious rash, lesion, or ulcer.   DATA REVIEW:   CBC  Recent Labs Lab 03/09/16 0803  03/09/16 1429  WBC 9.6  --   HGB 11.0* 8.9*  HCT 31.2* 24.6*  PLT 148*  --     Chemistries   Recent Labs Lab 03/08/16 0827 03/09/16 0803  NA 134* 136  K 3.8 4.1  CL 103 108  CO2 22 24  GLUCOSE 202* 197*  BUN 8 9  CREATININE 0.78 0.78  CALCIUM 9.1 7.9*  AST 37  --   ALT 63  --   ALKPHOS 59  --   BILITOT 0.6  --     Cardiac Enzymes No results for input(s): TROPONINI in the last 168 hours.  Microbiology Results  No results found for this or any previous visit.  RADIOLOGY:  No results found.  EKG:   Orders placed or performed in visit on 11/25/12  . EKG 12-Lead      Management plans discussed with the patient, family and they are in agreement.  CODE STATUS:     Code Status Orders        Start     Ordered   03/08/16 1352  Full code  Continuous     03/08/16 1352    Code Status History    Date Active Date Inactive Code Status Order ID Comments User Context   This patient has a current code status but no historical code status.      TOTAL TIME TAKING CARE OF THIS PATIENT: 40 minutes.    Katharina Caper M.D on 03/09/2016 at 6:17 PM  Between 7am to 6pm - Pager - [phone]  After 6pm go to [email or web address] - password [email or web address] [facility]  [place] [facility]  Office  [phone]  CC: Primary care physician; No PCP Per Patient

## 2016-03-09 NOTE — Progress Notes (Signed)
Patient resting with eyes closed at present. Pt completed colonoscopy prep.

## 2016-03-09 NOTE — Progress Notes (Signed)
[facility] about pt [facility]. Suzette report that bed is available and would callback if it is possible to hold bed until pt lab draw at 0230. Suzette was informed that pt hemoglobin dropped to 8.1 but MD wanted pt to stay put until 0230 blood draw.

## 2016-03-09 NOTE — Progress Notes (Signed)
Hgb 11.5, pt resting with eyes closed. Wife at bedside, discussed hgb result with her, will continue to monitor. Order for Hgb to be check q 6 hours.

## 2016-03-09 NOTE — H&P (View-Only) (Signed)
Timothy Miniumarren Ferne [place], MD [facility]  [address] [place], [place] [address] Phone: [phone] Fax : [phone]  Consultation  Referring Provider:     No ref. provider found Primary Care Physician:  No PCP Per Patient Primary Gastroenterologist:  None         Reason for Consultation:     Hematochezia  Date of Admission:  03/08/2016 Date of Consultation:  03/08/2016         HPI:   Timothy Cameron is a 50 y.o. male Comes in with rectal bleeding.  The patient reports that his rectal bleeding started on the day before admission.  He has had several episodes of bright red blood that he states is large in volume.  Despite this the patient's hemoglobin on admission was normal. The patient also reports that he has had GI bleeding the past and had an obstruction of his colon with removal of part of his colon.  The patient is not sure who did his last colonoscopy or who did his surgery.  There is no report of any unexplained weight loss, fevers, chills, nausea or vomiting.  He denies any abdominal pain associated with his rectal bleeding.  The patient also denies taking any blood thinners or anti-inflammatory medications.  Now being asked to see the patient for his lower GI bleeding.  History reviewed. No pertinent past medical history.  Past Surgical History:  Procedure Laterality Date  . ABDOMINAL SURGERY      Prior to Admission medications   Medication Sig Start Date End Date Taking? Authorizing Provider  psyllium (METAMUCIL) 58.6 % packet Take 1 packet by mouth at bedtime.   Yes Historical Provider, MD  senna-docusate (SENOKOT-S) 8.6-50 MG tablet Take 3 tablets by mouth at bedtime.   Yes Historical Provider, MD    History reviewed. No pertinent family history.   Social History  Substance Use Topics  . Smoking status: Current Every Day Smoker    Packs/day: 2.00    Years: 5.00    Types: Cigars  . Smokeless tobacco: Never Used  . Alcohol use 15.6 oz/week    26 Cans of beer per week      Allergies as of 03/08/2016  . (No Known Allergies)    Review of Systems:    All systems reviewed and negative except where noted in HPI.   Physical Exam:  Vital signs in last 24 hours: Temp:  [97.9 F (36.6 C)-98.1 F (36.7 C)] 98.1 F (36.7 C) (11/17 1349) Pulse Rate:  [73-98] 78 (11/17 1802) Resp:  [15-18] 18 (11/17 1349) BP: (128-165)/(82-97) 137/89 (11/17 1802) SpO2:  [96 %-99 %] 99 % (11/17 1349) Weight:  [220 lb (99.8 kg)] 220 lb (99.8 kg) (11/17 0758)   General:   Pleasant, cooperative in NAD Head:  Normocephalic and atraumatic. Eyes:   No icterus.   Conjunctiva pink. PERRLA. Ears:  Normal auditory acuity. Neck:  Supple; no masses or thyroidomegaly Lungs: Respirations even and unlabored. Lungs clear to auscultation bilaterally.   No wheezes, crackles, or rhonchi.  Heart:  Regular rate and rhythm;  Without murmur, clicks, rubs or gallops Abdomen:  Soft, nondistended, nontender. Normal bowel sounds. No appreciable masses or hepatomegaly.  No rebound or guarding.  Rectal:  Not performed. Msk:  Symmetrical without gross deformities.   Extremities:  Without edema, cyanosis or clubbing. Neurologic:  Alert and oriented x3;  grossly normal neurologically. Skin:  Intact without significant lesions or rashes. Cervical Nodes:  No significant cervical adenopathy. Psych:  Alert and cooperative.  Normal affect.  LAB RESULTS:  Recent Labs  03/08/16 0827 03/08/16 1416  WBC 12.3*  --   HGB 16.5 15.2  HCT 47.8  --   PLT 167  --    BMET  Recent Labs  03/08/16 0827  NA 134*  K 3.8  CL 103  CO2 22  GLUCOSE 202*  BUN 8  CREATININE 0.78  CALCIUM 9.1   LFT  Recent Labs  03/08/16 0827  PROT 7.2  ALBUMIN 4.4  AST 37  ALT 63  ALKPHOS 59  BILITOT 0.6   PT/INR  Recent Labs  03/08/16 0827  LABPROT 14.1  INR 1.09    STUDIES: No results found.    Impression / Plan:   Timothy Cameron is a 50 y.o. y/o male with Hematochezia and a history of  diverticulosis.  The patient has had a colon resection for unknown reasons.  The patient states that he had a blockage.  The patient likely has diverticular bleeding versus hemorrhoidal bleeding although this is less likely.  The patient will be given a prep tonight and will have his colonoscopy done in the morning.  The patient has been explained the plan and agrees with it.   Thank you for involving me in the care of this patient.      LOS: 0 days   Timothy Miniumarren Jaziel Bennett, MD  03/08/2016, 7:27 PM   Note: This dictation was prepared with Dragon dictation along with smaller phrase technology. Any transcriptional errors that result from this process are unintentional.

## 2016-03-09 NOTE — Progress Notes (Signed)
Paged and spoke with Dr. Judithann SheenSparks about pt hemoglobin of 8.1 from 8.9. Per Dr. Judithann SheenSparks, will continue to monitor and recheck in 6hrs.

## 2016-03-09 NOTE — Transfer of Care (Signed)
Immediate Anesthesia Transfer of Care Note  Patient: Timothy Cameron  Procedure(s) Performed: Procedure(s): COLONOSCOPY (N/A)  Patient Location: PACU and Endoscopy Unit  Anesthesia Type:General  Level of Consciousness: patient cooperative and lethargic  Airway & Oxygen Therapy: Patient Spontanous Breathing and Patient connected to nasal cannula oxygen  Post-op Assessment: Report given to RN and Post -op Vital signs reviewed and stable  Post vital signs: Reviewed and stable  Last Vitals:  Vitals:   03/09/16 0451 03/09/16 1026  BP: 120/66 105/74  Pulse: 89 84  Resp:  (!) 24  Temp: 36.6 C     Last Pain:  Vitals:   03/09/16 0451  TempSrc: Oral  PainSc:          Complications: No apparent anesthesia complications

## 2016-03-09 NOTE — Plan of Care (Signed)
Problem: Physical Regulation: Goal: Ability to maintain clinical measurements within normal limits will improve Outcome: Not Progressing Hqb dropped from 11.5 to 8.9

## 2016-03-09 NOTE — Progress Notes (Signed)
[facility] - [place] at [facility]   PATIENT NAME: Timothy RudMichael Cameron    MR#:  [id number]  DATE OF BIRTH:  Aug 21, 1965  SUBJECTIVE:  CHIEF COMPLAINT:   Chief Complaint  Patient presents with  . Rectal Bleeding  The patient is a 50 year old Caucasian male with past medical history significant for history of previous bowel resection due to unknown etiology, who presents to the hospital with complaints of gastrointestinal bleed, passing bright red blood per rectum. No abdominal pain was reported. No diarrheal stool. Patient underwent colonoscopy today, which revealed suture line. No bleeding due to ischemic ulcer, per gastroenterologist, Dr. Bosie ClosSchooler. [facility] if [facility] does not stop. Liquid diet is initiated  Review of Systems  Constitutional: Negative for chills, fever and weight loss.  HENT: Negative for congestion.   Eyes: Negative for blurred vision and double vision.  Respiratory: Negative for cough, sputum production, shortness of breath and wheezing.   Cardiovascular: Negative for chest pain, palpitations, orthopnea, leg swelling and PND.  Gastrointestinal: Positive for blood in stool. Negative for abdominal pain, constipation, diarrhea, nausea and vomiting.  Genitourinary: Negative for dysuria, frequency, hematuria and urgency.  Musculoskeletal: Negative for falls.  Neurological: Negative for dizziness, tremors, focal weakness and headaches.  Endo/Heme/Allergies: Does not bruise/bleed easily.  Psychiatric/Behavioral: Negative for depression. The patient does not have insomnia.     VITAL SIGNS: Blood pressure 131/77, pulse 80, temperature 97.8 F (36.6 C), temperature source Oral, resp. rate (!) 24, height 5\' 11"  (1.803 m), weight 99.8 kg (220 lb), SpO2 99 %.  PHYSICAL EXAMINATION:   GENERAL:  50 y.o.-year-old patient lying in the bed with no acute distress.  EYES: Pupils equal, round, reactive to light and accommodation. No  scleral icterus. Extraocular muscles intact.  HEENT: Head atraumatic, normocephalic. Oropharynx and nasopharynx clear.  NECK:  Supple, no jugular venous distention. No thyroid enlargement, no tenderness.  LUNGS: Normal breath sounds bilaterally, no wheezing, rales,rhonchi or crepitation. No use of accessory muscles of respiration.  CARDIOVASCULAR: S1, S2 normal. No murmurs, rubs, or gallops.  ABDOMEN: Soft, nontender, nondistended. Bowel sounds present. No organomegaly or mass.  EXTREMITIES: No pedal edema, cyanosis, or clubbing.  NEUROLOGIC: Cranial nerves II through XII are intact. Muscle strength 5/5 in all extremities. Sensation intact. Gait not checked.  PSYCHIATRIC: The patient is alert and oriented x 3.  SKIN: No obvious rash, lesion, or ulcer.   ORDERS/RESULTS REVIEWED:   CBC  Recent Labs Lab 03/08/16 0827 03/08/16 1416 03/08/16 1944 03/09/16 0156 03/09/16 0803  WBC 12.3*  --   --   --  9.6  HGB 16.5 15.2 13.3 11.5* 11.0*  HCT 47.8  --   --   --  31.2*  PLT 167  --   --   --  148*  MCV 91.7  --   --   --  90.5  MCH 31.6  --   --   --  32.0  MCHC 34.5  --   --   --  35.3  RDW 12.2  --   --   --  12.3   ------------------------------------------------------------------------------------------------------------------  Chemistries   Recent Labs Lab 03/08/16 0827 03/09/16 0803  NA 134* 136  K 3.8 4.1  CL 103 108  CO2 22 24  GLUCOSE 202* 197*  BUN 8 9  CREATININE 0.78 0.78  CALCIUM 9.1 7.9*  AST 37  --   ALT 63  --   ALKPHOS 59  --   BILITOT 0.6  --    ------------------------------------------------------------------------------------------------------------------  estimated creatinine clearance is 133 mL/min (by C-G formula based on SCr of 0.78 mg/dL). ------------------------------------------------------------------------------------------------------------------ No results for input(s): TSH, T4TOTAL, T3FREE, THYROIDAB in the last 72 hours.  Invalid  input(s): FREET3  Cardiac Enzymes No results for input(s): CKMB, TROPONINI, MYOGLOBIN in the last 168 hours.  Invalid input(s): CK ------------------------------------------------------------------------------------------------------------------ Invalid input(s): POCBNP ---------------------------------------------------------------------------------------------------------------  RADIOLOGY: No results found.  EKG:  Orders placed or performed in visit on 11/25/12  . EKG 12-Lead    ASSESSMENT AND PLAN:  Active Problems:   GIB (gastrointestinal bleeding)   Lower GI bleed #1. Lower GI bleed due to ischemic ulcer bleeding at the suture line in the rectum, status post colonoscopy 18th of November 2017 by Dr. Bosie ClosSchooler, medications were injected but not cauterized due to concerns of perforation, gastroenterologist discussed case with the surgeon on call, Dr. Michela PitcherEly, recommended to continue clear liquid diet, [facility] if bleeding does not stop with conservative therapy #2. Acute posthemorrhagic anemia, hemoglobin level has decreased to 11.0 from 16.5 on admission, continue follow hemoglobin levels and transfuse patient if needed, discussed risks as well as benefits of transfusion #3 Thrombocytopenia, likely consumption, follow in the morning #4. Hyperglycemia, get hemoglobin A1c to rule out diabetes #5. Leukocytosis, resolved #6. Tobacco abuse. Counseling, discussed this patient for 3 minutes, nicotine replacement therapy is going to be initiated   Management plans discussed with the patient, family and they are in agreement.   DRUG ALLERGIES: No Known Allergies  CODE STATUS:     Code Status Orders        Start     Ordered   03/08/16 1352  Full code  Continuous     03/08/16 1352    Code Status History    Date Active Date Inactive Code Status Order ID Comments User Context   This patient has a current code status but no historical code status.      TOTAL TIME  TAKING CARE OF THIS PATIENT: 40 minutes.    Katharina CaperVAICKUTE,Kruz Chiu M.D on 03/09/2016 at 2:15 PM  Between 7am to 6pm - Pager - [phone]  After 6pm go to [email or web address] - password [email or web address] [email or web address]  [facility]  Office  [phone]  CC: Primary care physician; No PCP Per Patient

## 2016-03-10 LAB — HEMOGLOBIN
HEMOGLOBIN: 7.7 g/dL — AB (ref 13.0–18.0)
HEMOGLOBIN: 8.8 g/dL — AB (ref 13.0–18.0)
Hemoglobin: 7.7 g/dL — ABNORMAL LOW (ref 13.0–18.0)

## 2016-03-10 LAB — PLATELET COUNT: PLATELETS: 121 10*3/uL — AB (ref 150–440)

## 2016-03-10 LAB — HEMOGLOBIN A1C
Hgb A1c MFr Bld: 7.3 % — ABNORMAL HIGH (ref 4.8–5.6)
Mean Plasma Glucose: 163 mg/dL

## 2016-03-10 MED ORDER — SODIUM CHLORIDE 0.9 % IV BOLUS (SEPSIS)
500.0000 mL | Freq: Once | INTRAVENOUS | Status: AC
  Administered 2016-03-10: 500 mL via INTRAVENOUS

## 2016-03-10 MED ORDER — INSULIN ASPART 100 UNIT/ML ~~LOC~~ SOLN: 0.0000 [IU] | Freq: Three times a day (TID) | SUBCUTANEOUS | Status: DC

## 2016-03-10 NOTE — Discharge Summary (Signed)
[facility] [place] at [facility]   PATIENT NAME: Timothy RudMichael Manseau    MR#:  [id number]  DATE OF BIRTH:  07-07-1965  DATE OF ADMISSION:  03/08/2016 ADMITTING PHYSICIAN: Adrian SaranSital Mody, MD  DATE OF DISCHARGE: 03/10/2016  5:30 PM  PRIMARY CARE PHYSICIAN: No PCP Per Patient     ADMISSION DIAGNOSIS:  Lower GI bleed [K92.2]  DISCHARGE DIAGNOSIS:  Principal Problem:   GIB (gastrointestinal bleeding) Active Problems:   Lower GI bleed   Acute posthemorrhagic anemia   Thrombocytopenia (HCC)   Diabetes (HCC)   Tobacco abuse counseling   SECONDARY DIAGNOSIS:  History reviewed. No pertinent past medical history.  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 No abdominal pain was reported. No diarrheal stool. Patient underwent colonoscopy 03/09/16, which revealed suture line bleeding thought to be due to ischemic ulcer, epinephrine was injected, but cauterzation was not done due to concerns of complications, per gastroenterologist, Dr. Bosie ClosSchooler. [facility] as bleeding subsided but did not stop. Liquid diet was initiated. HGb dropped to 7.7 on the day of discharge, but improved to  8.8 stopping IVF. Discussed with [facility] MD who [facility] for [facility] and surgical consultation. [facility] bed [facility] and patient is transferred.. Discussion by problem: #1. Lower GI bleed felt to be due to ischemic ulcer bleeding at the suture line in the rectum, status post colonoscopy 18th of November 2017 by Dr. Bosie ClosSchooler, epinephrine was  injected but not cauterized due to concerns of perforation, gastroenterologist discussed case with the surgeon on call, Dr. Michela PitcherEly, recommended to continue clear liquid diet, [facility] if bleeding does not stop with conservative therapy. Hemoglobin continued to decline and the patient was transfered .  #2. Acute posthemorrhagic anemia, hemoglobin level has decreased to 8.8 from 16.5 on admission, the patient did not need blood transfusion.  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 New onset diabetes, hemoglobin A1c 7.3, initiated diabetic education, SSI, dietary consult obtained #5. Leukocytosis, resolved #6. Tobacco abuse counseling, discussed with patient for 3 minutes, nicotine replacement therapy is  to be continued.  DISCHARGE CONDITIONS:   stable  CONSULTS OBTAINED:  Treatment Team:  Midge Miniumarren Wohl, MD  DRUG ALLERGIES:  No Known Allergies  DISCHARGE MEDICATIONS:   Discharge Medication List as of 03/10/2016  5:40 PM    START taking these medications   Details  nicotine (NICODERM CQ - DOSED IN MG/24 HOURS) 21 mg/24hr patch Place 1 patch (21 mg total) onto the skin daily., Starting Sun 03/10/2016, Normal      CONTINUE these medications which have NOT CHANGED   Details  psyllium (METAMUCIL) 58.6 % packet Take 1 packet by mouth at bedtime., Historical Med    senna-docusate (SENOKOT-S) 8.6-50 MG tablet Take 3 tablets by mouth at bedtime., Historical Med         DISCHARGE INSTRUCTIONS:    The patient is to follow up with PCP  If you experience worsening of your admission symptoms, develop shortness of breath, life threatening emergency, suicidal or homicidal thoughts you must seek medical attention immediately by calling 911 or calling your MD immediately  if symptoms less severe.  You Must read complete instructions/literature along with all the possible adverse reactions/side effects for all  the Medicines you take and that have been prescribed to you. Take any new Medicines after you have completely understood and accept all the possible adverse reactions/side effects.   Please note  You were  cared for by a hospitalist during your hospital stay. If you have any questions about your discharge medications or the care you received while you were in the hospital after you are discharged, you can call the unit and asked to speak with the hospitalist on call if the hospitalist that took care of you is not available. Once you are discharged, your primary care physician will handle any further medical issues. Please note that NO REFILLS for any discharge medications will be authorized once you are discharged, as it is imperative that you return to your primary care physician (or establish a relationship with a primary care physician if you do not have one) for your aftercare needs so that they can reassess your need for medications and monitor your lab values.    Today   CHIEF COMPLAINT:   Chief Complaint  Patient presents with  . 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 No abdominal pain was reported. No diarrheal stool. Patient underwent colonoscopy 03/09/16, which revealed suture line bleeding thought to be due to ischemic ulcer, epinephrine was injected, but cauterzation was not done due to concerns of complications, per gastroenterologist, Dr. Bosie ClosSchooler. [facility] as bleeding subsided but did not stop. Liquid diet was initiated. HGb dropped to 7.7 on the day of discharge, but improved to  8.8 stopping IVF. [facility] who [facility] for [facility] and surgical consultation. [facility] and patient is transferred.. Discussion by problem: #1. Lower GI bleed felt to be due to ischemic ulcer bleeding at the suture line in the rectum, status post colonoscopy 18th of November 2017 by  Dr. Bosie ClosSchooler, epinephrine was  injected but not cauterized due to concerns of perforation, gastroenterologist discussed case with the surgeon on call, Dr. Michela PitcherEly, recommended to continue clear liquid diet, [facility] bleeding does not stop with conservative therapy. Hemoglobin continued to decline and the patient was transfered .  #2. Acute posthemorrhagic anemia, hemoglobin level has decreased to 8.8 from 16.5 on admission, the patient did not need blood transfusion.  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 New onset diabetes, hemoglobin A1c 7.3, initiated diabetic education, SSI, dietary consult obtained #5. Leukocytosis, resolved #6. Tobacco abuse counseling, discussed with patient for 3 minutes, nicotine replacement therapy is  to be continued.    VITAL SIGNS:  Blood pressure 136/80, pulse 88, temperature 99.2 F (37.3 C), temperature source Oral, resp. rate 17, height 5\' 11"  (1.803 m), weight 99.8 kg (220 lb), SpO2 100 %.  I/O:    Intake/Output Summary (Last 24 hours) at 03/10/16 1940 Last data filed at 03/10/16 1715  Gross per 24 hour  Intake             1000 ml  Output                0 ml  Net             1000 ml    PHYSICAL EXAMINATION:  GENERAL:  50 y.o.-year-old patient lying in the bed with no acute distress.  EYES: Pupils equal, round, reactive to light and accommodation. No scleral icterus. Extraocular muscles intact.  HEENT: Head atraumatic, normocephalic. Oropharynx and nasopharynx clear.  NECK:  Supple, no jugular venous distention. No thyroid enlargement, no tenderness.  LUNGS: Normal breath sounds bilaterally, no wheezing, rales,rhonchi or crepitation. No use of accessory muscles of respiration.  CARDIOVASCULAR: S1, S2 normal. No murmurs, rubs, or gallops.  ABDOMEN: Soft, non-tender, non-distended. Bowel sounds present. No organomegaly or mass.  EXTREMITIES: No pedal edema, cyanosis, or clubbing.   NEUROLOGIC: Cranial nerves II through XII are intact. Muscle strength 5/5 in all extremities. Sensation intact. Gait not checked.  PSYCHIATRIC: The patient is alert and oriented x 3.  SKIN: No obvious rash, lesion, or ulcer.   DATA REVIEW:   CBC  Recent Labs Lab 03/09/16 0803 03/09/16 1429  03/10/16 0417  03/10/16 1351  WBC 9.6  --   --   --   --   --   HGB 11.0* 8.9*  < >  --   < > 8.8*  HCT 31.2* 24.6*  --   --   --   --   PLT 148*  --   --  121*  --   --   < > = values in this interval not displayed.  Chemistries   Recent Labs Lab 03/08/16 0827 03/09/16 0803  NA 134* 136  K 3.8 4.1  CL 103 108  CO2 22 24  GLUCOSE 202* 197*  BUN 8 9  CREATININE 0.78 0.78  CALCIUM 9.1 7.9*  AST 37  --   ALT 63  --   ALKPHOS 59  --   BILITOT 0.6  --     Cardiac Enzymes No results for input(s): TROPONINI in the last 168 hours.  Microbiology Results  No results found for this or any previous visit.  RADIOLOGY:  No results found.  EKG:   Orders placed or performed in visit on 11/25/12  . EKG 12-Lead      Management plans discussed with the patient, family and they are in agreement.  CODE STATUS:     Code Status Orders        Start     Ordered   03/08/16 1352  Full code  Continuous     03/08/16 1352    Code Status History    Date Active Date Inactive Code Status Order ID Comments User Context   This patient has a current code status but no historical code status.      TOTAL TIME TAKING CARE OF THIS PATIENT: 40 minutes.    Katharina Caper M.D on 03/10/2016 at 7:40 PM  Between 7am to 6pm - Pager - [phone]  After 6pm go to [email or web address] - password [email or web address] [facility]  [place] [facility]  Office  [phone]  CC: Primary care physician; No PCP Per Patient

## 2016-03-10 NOTE — Progress Notes (Signed)
Pt. [facility]. Report given to Ander SladeJoy, Charity fundraiserN at [facility]. Pt being discharged on room air, no distress noted. Otilio JeffersonMadelyn S Fenton, RN

## 2016-03-10 NOTE — Progress Notes (Signed)
Gastroenterology Progress Note    Timothy IhaMichael W Cameron 50 y.o. 10-26-1965   Subjective: Feels "90% better." Denies any bleeding today. Denies abdominal pain.  Objective: Vital signs in last 24 hours: Vitals:   03/09/16 2005 03/10/16 0508  BP: 125/71 107/71  Pulse: 84 92  Resp: 20 20  Temp: 97.8 F (36.6 C) 98.5 F (36.9 C)    Physical Exam: Gen: alert, no acute distress HEENT: anicteric sclera CV: RRR Chest: CTA B Abd: soft, nontender, nondistended,+BS, LLQ scar Ext: no edema  Lab Results:  Recent Labs  03/08/16 0827 03/09/16 0803  NA 134* 136  K 3.8 4.1  CL 103 108  CO2 22 24  GLUCOSE 202* 197*  BUN 8 9  CREATININE 0.78 0.78  CALCIUM 9.1 7.9*    Recent Labs  03/08/16 0827  AST 37  ALT 63  ALKPHOS 59  BILITOT 0.6  PROT 7.2  ALBUMIN 4.4    Recent Labs  03/08/16 0827  03/09/16 0803 03/09/16 1429  03/10/16 0228 03/10/16 0417 03/10/16 0728  WBC 12.3*  --  9.6  --   --   --   --   --   HGB 16.5  < > 11.0* 8.9*  < > 7.7*  --  7.7*  HCT 47.8  --  31.2* 24.6*  --   --   --   --   MCV 91.7  --  90.5  --   --   --   --   --   PLT 167  --  148*  --   --   --  121*  --   < > = values in this interval not displayed.  Recent Labs  03/08/16 0827  LABPROT 14.1  INR 1.09      Assessment/Plan: Lower GI bleed at colorectal anastomosis - question ischemic ulcer beneath adherent clot seen on colonoscopy yesterday. Hgb 7.7. Bleeding seems to have stopped but increased risk for further bleeding from stool passing the anastomosis or straining. [facility]. Cautery of that area would be a temporizing treatment and at increased risk for perforation and he likely will continue to have recurrent bleeding. Unfortunate that he was not transferred early this morning when [facility]. No further role for endoscopic management here. Would favor surgical evaluation and management as an inpt over discharge home and [facility] unless he could be seen within a week by them. Continue supportive care. Changed to full liquid diet. Talked with hospitalist.    Donalee Gaumond C. 03/10/2016, 1:23 PM

## 2016-03-10 NOTE — Addendum Note (Signed)
Addendum  created 03/10/16 1802 by Makaylia Hewett, MD   Sign clinical note    

## 2016-03-10 NOTE — Progress Notes (Signed)
Paged and spoke Dr. Tobi BastosPyreddy about pt hemoglobin of 7.7. MD is aware that pt hemoglobin has dropped from 8.9 to 8.1 and 7.7 and there [facility]. Per MD, hold the pt and continue to monitor the hemoglobin. [place] [facility] called at 0320 to follow up. [place]adaBrittany report she is not sure they will keep holding the bed but she will report the information back to the house supervisor. [place]adaBrittany was informed that Per MD, hold the pt and continue to monitor. Pt will be informed of developments.

## 2016-03-10 NOTE — Progress Notes (Signed)
[facility] return call at 0120 to say that they will hold bed for pt until after the next blood draw at 0230.

## 2016-03-10 NOTE — Progress Notes (Signed)
[facility] - [place] at [facility]   PATIENT NAME: Timothy Cameron    MR#:  [id number]  DATE OF BIRTH:  10/16/1965  SUBJECTIVE:  CHIEF COMPLAINT:   Chief Complaint  Patient presents with  . Rectal Bleeding  The patient is a 50 year old Caucasian male with past medical history significant for history of previous bowel resection due to unknown etiology, who presents to the hospital with complaints of gastrointestinal bleed, passing bright red blood per rectum. No abdominal pain was reported. No diarrheal stool. Patient underwent colonoscopy today, which revealed suture line. No bleeding due to ischemic ulcer, per gastroenterologist, Dr. Bosie ClosSchooler. [facility], the bed was available yesterday, however, patient was not discharged. Vision continues to have some oozing from her rectum, dark blood. Hemoglobin level dropped down to 7.7 today, remains stable. Patient feels satisfactory today, however, admits of some nausea with food intake    Review of Systems  Constitutional: Negative for chills, fever and weight loss.  HENT: Negative for congestion.   Eyes: Negative for blurred vision and double vision.  Respiratory: Negative for cough, sputum production, shortness of breath and wheezing.   Cardiovascular: Negative for chest pain, palpitations, orthopnea, leg swelling and PND.  Gastrointestinal: Positive for blood in stool. Negative for abdominal pain, constipation, diarrhea, nausea and vomiting.  Genitourinary: Negative for dysuria, frequency, hematuria and urgency.  Musculoskeletal: Negative for falls.  Neurological: Negative for dizziness, tremors, focal weakness and headaches.  Endo/Heme/Allergies: Does not bruise/bleed easily.  Psychiatric/Behavioral: Negative for depression. The patient does not have insomnia.     VITAL SIGNS: Blood pressure 107/71, pulse 92, temperature 98.5 F (36.9 C), temperature source Oral, resp. rate 20, height  5\' 11"  (1.803 m), weight 99.8 kg (220 lb), SpO2 98 %.  PHYSICAL EXAMINATION:   GENERAL:  50 y.o.-year-old patient lying in the bed with no acute distress.  EYES: Pupils equal, round, reactive to light and accommodation. No scleral icterus. Extraocular muscles intact.  HEENT: Head atraumatic, normocephalic. Oropharynx and nasopharynx clear.  NECK:  Supple, no jugular venous distention. No thyroid enlargement, no tenderness.  LUNGS: Normal breath sounds bilaterally, no wheezing, rales,rhonchi or crepitation. No use of accessory muscles of respiration.  CARDIOVASCULAR: S1, S2 normal. No murmurs, rubs, or gallops.  ABDOMEN: Soft, nontender, nondistended. Bowel sounds present. No organomegaly or mass.  EXTREMITIES: No pedal edema, cyanosis, or clubbing.  NEUROLOGIC: Cranial nerves II through XII are intact. Muscle strength 5/5 in all extremities. Sensation intact. Gait not checked.  PSYCHIATRIC: The patient is alert and oriented x 3.  SKIN: No obvious rash, lesion, or ulcer.   ORDERS/RESULTS REVIEWED:   CBC  Recent Labs Lab 03/08/16 0827  03/09/16 0803 03/09/16 1429 03/09/16 1947 03/10/16 0228 03/10/16 0417 03/10/16 0728  WBC 12.3*  --  9.6  --   --   --   --   --   HGB 16.5  < > 11.0* 8.9* 8.1* 7.7*  --  7.7*  HCT 47.8  --  31.2* 24.6*  --   --   --   --   PLT 167  --  148*  --   --   --  121*  --   MCV 91.7  --  90.5  --   --   --   --   --   MCH 31.6  --  32.0  --   --   --   --   --   MCHC 34.5  --  35.3  --   --   --   --   --  RDW 12.2  --  12.3  --   --   --   --   --   < > = values in this interval not displayed. ------------------------------------------------------------------------------------------------------------------  Chemistries   Recent Labs Lab 03/08/16 0827 03/09/16 0803  NA 134* 136  K 3.8 4.1  CL 103 108  CO2 22 24  GLUCOSE 202* 197*  BUN 8 9  CREATININE 0.78 0.78  CALCIUM 9.1 7.9*  AST 37  --   ALT 63  --   ALKPHOS 59  --   BILITOT 0.6  --     ------------------------------------------------------------------------------------------------------------------ estimated creatinine clearance is 133 mL/min (by C-G formula based on SCr of 0.78 mg/dL). ------------------------------------------------------------------------------------------------------------------ No results for input(s): TSH, T4TOTAL, T3FREE, THYROIDAB in the last 72 hours.  Invalid input(s): FREET3  Cardiac Enzymes No results for input(s): CKMB, TROPONINI, MYOGLOBIN in the last 168 hours.  Invalid input(s): CK ------------------------------------------------------------------------------------------------------------------ Invalid input(s): POCBNP ---------------------------------------------------------------------------------------------------------------  RADIOLOGY: No results found.  EKG:  Orders placed or performed in visit on 11/25/12  . EKG 12-Lead    ASSESSMENT AND PLAN:  Principal Problem:   GIB (gastrointestinal bleeding) Active Problems:   Lower GI bleed   Acute posthemorrhagic anemia   Thrombocytopenia (HCC)   Hyperglycemia   Tobacco abuse counseling #1. Lower GI bleed due to ischemic ulcer bleeding at the suture line in the rectum, status post colonoscopy 18th of November 2017 by Dr. Bosie ClosSchooler, medications were injected but not cauterized due to concerns of perforation, gastroenterologist discussed case with the surgeon on call, Dr. Michela PitcherEly, recommended to continue clear liquid diet, [facility] , [facility] who [facility] transfer and bed was available last night, however, patient was not transfer due to unknown reasons. [facility], discussed with Dr. Bosie ClosSchooler  #2. Acute posthemorrhagic anemia, hemoglobin level has decreased to 7.7  from 16.5 on admission, continue follow hemoglobin levels and transfuse patient if needed, , stable so far #3 Thrombocytopenia, likely  consumption and alcohol related, and continues to decrease, follow in the morning #4. Hyperglycemia, hemoglobin A1cwas high at 7.3, ruled in for diabetes, initiate diabetic education, sliding scale insulin .  #5. Leukocytosis, resolved #6. Tobacco abuse. Counseling, discussed this patient for 3 minutes yesterday, nicotine replacement therapy is  initiated   Management plans discussed with the patient, family and they are in agreement.   DRUG ALLERGIES: No Known Allergies  CODE STATUS:     Code Status Orders        Start     Ordered   03/08/16 1352  Full code  Continuous     03/08/16 1352    Code Status History    Date Active Date Inactive Code Status Order ID Comments User Context   This patient has a current code status but no historical code status.      TOTAL TIME TAKING CARE OF THIS PATIENT: 40 minutes.  Discussed with Dr. Kandis MannanSchooler  Princess Karnes M.D on 03/10/2016 at 1:00 PM  Between 7am to 6pm - Pager - [phone]  After 6pm go to [email or web address] - password [email or web address] [facility]  [place] [place] [facility]  Office  [phone]  CC: Primary care physician; No PCP Per Patient

## 2016-03-10 NOTE — Anesthesia Postprocedure Evaluation (Signed)
Anesthesia Post Note  Patient: Timothy IhaMichael W Cameron  Procedure(s) Performed: Procedure(s) (LRB): COLONOSCOPY (N/A)  Patient location during evaluation: PACU Anesthesia Type: General Level of consciousness: awake and alert and oriented Pain management: pain level controlled Vital Signs Assessment: post-procedure vital signs reviewed and stable Respiratory status: spontaneous breathing Cardiovascular status: blood pressure returned to baseline Anesthetic complications: no    Last Vitals:  Vitals:   03/09/16 2005 03/10/16 0508  BP: 125/71 107/71  Pulse: 84 92  Resp: 20 20  Temp: 36.6 C 36.9 C    Last Pain:  Vitals:   03/10/16 0508  TempSrc: Oral  PainSc:                  Sonny Anthes

## 2016-03-11 ENCOUNTER — Encounter: Payer: Self-pay | Admitting: Gastroenterology

## 2016-05-05 ENCOUNTER — Emergency Department
Admission: EM | Admit: 2016-05-05 | Discharge: 2016-05-05 | Disposition: A | Discharge status: Home / Self Care | Payer: Self-pay | Attending: Emergency Medicine | Admitting: Emergency Medicine

## 2016-05-05 DIAGNOSIS — J111 Influenza due to unidentified influenza virus with other respiratory manifestations: Secondary | ICD-10-CM | POA: Insufficient documentation

## 2016-05-05 DIAGNOSIS — F17290 Nicotine dependence, other tobacco product, uncomplicated: Secondary | ICD-10-CM | POA: Insufficient documentation

## 2016-05-05 DIAGNOSIS — E119 Type 2 diabetes mellitus without complications: Secondary | ICD-10-CM | POA: Insufficient documentation

## 2016-05-05 MED ORDER — OSELTAMIVIR PHOSPHATE 75 MG PO CAPS: 75.0000 mg | ORAL_CAPSULE | Freq: Two times a day (BID) | ORAL | 0 refills | Status: AC

## 2016-05-05 MED ORDER — BENZONATATE 100 MG PO CAPS
200.0000 mg | ORAL_CAPSULE | Freq: Three times a day (TID) | ORAL | 0 refills | Status: AC | PRN
Start: 2016-05-05 — End: 2017-05-05

## 2016-05-05 NOTE — ED Notes (Signed)
Pt given mask.

## 2016-05-05 NOTE — Discharge Instructions (Signed)
[facility] any continued problems. Begin taking Tamiflu twice a day for 5 days. Tessalon Perles as needed for cough. He may also continue taking Tylenol or ibuprofen as needed for body aches and fever. Increase fluids.

## 2016-05-05 NOTE — ED Provider Notes (Signed)
[facility] Emergency Department Provider Note  ____________________________________________   First MD Initiated Contact with Patient 05/05/16 0920     (approximate)  I have reviewed the triage vital signs and the nursing notes.   HISTORY  Chief Complaint Generalized Body Aches and Fever    HPI Timothy Cameron is a 51 y.o. male is here with complaint of sudden onset of body aches, nonproductive cough and fever. Patient states that his wife was seen in the emergency room yesterday and diagnosed with the flu. Patient began having symptoms last evening and he feels worse today. He denies any nausea, vomiting or diarrhea. He states he has had low-grade temperature at home. Symptoms were sudden onset. Currently he rates his pain as a 4 out of 10.   History reviewed. No pertinent past medical history.  Patient Active Problem List   Diagnosis Date Noted  . Acute posthemorrhagic anemia 03/09/2016  . Thrombocytopenia (HCC) 03/09/2016  . Diabetes (HCC) 03/09/2016  . Tobacco abuse counseling 03/09/2016  . GIB (gastrointestinal bleeding) 03/08/2016  . Lower GI bleed     Past Surgical History:  Procedure Laterality Date  . ABDOMINAL SURGERY    . COLONOSCOPY N/A 03/09/2016   Procedure: COLONOSCOPY;  Surgeon: Charlott Rakes, MD;  Location: [facility] ENDOSCOPY;  Service: Endoscopy;  Laterality: N/A;    Prior to Admission medications   Medication Sig Start Date End Date Taking? Authorizing Provider  benzonatate (TESSALON PERLES) 100 MG capsule Take 2 capsules (200 mg total) by mouth 3 (three) times daily as needed. 05/05/16 05/05/17  Tommi Rumps, PA-C  oseltamivir (TAMIFLU) 75 MG capsule Take 1 capsule (75 mg total) by mouth 2 (two) times daily. 05/05/16 05/10/16  Tommi Rumps, PA-C  psyllium (METAMUCIL) 58.6 % packet Take 1 packet by mouth at bedtime.    Historical Provider, MD  senna-docusate (SENOKOT-S) 8.6-50 MG tablet Take 3 tablets by mouth at bedtime.     Historical Provider, MD    Allergies Patient has no known allergies.  No family history on file.  Social History Social History  Substance Use Topics  . Smoking status: Current Every Day Smoker    Packs/day: 2.00    Years: 5.00    Types: Cigars  . Smokeless tobacco: Never Used  . Alcohol use 15.6 oz/week    26 Cans of beer per week    Review of Systems Constitutional: Positive fever/chills Eyes: No visual changes. ENT: No sore throat. Cardiovascular: Denies chest pain. Respiratory: Denies shortness of breath. Gastrointestinal: No abdominal pain.  No nausea, no vomiting.  No diarrhea.   Genitourinary: Negative for dysuria. Musculoskeletal: Generalized body aches. Skin: Negative for rash. Neurological: Negative for headaches, focal weakness or numbness.  10-point ROS otherwise negative.  ____________________________________________   PHYSICAL EXAM:  VITAL SIGNS: ED Triage Vitals  Enc Vitals Group     BP 05/05/16 0856 124/88     Pulse Rate 05/05/16 0856 (!) 102     Resp 05/05/16 0856 18     Temp 05/05/16 0856 98.4 F (36.9 C)     Temp Source 05/05/16 0856 Oral     SpO2 05/05/16 0856 98 %     Weight 05/05/16 0857 220 lb (99.8 kg)     Height 05/05/16 0857 5\' 11"  (1.803 m)     Head Circumference --      Peak Flow --      Pain Score 05/05/16 0857 4     Pain Loc --  Pain Edu? --      Excl. in GC? --     Constitutional: Alert and oriented. Well appearing and in no acute distress. Eyes: Conjunctivae are normal. PERRL. EOMI. Head: Atraumatic. Nose: No congestion/rhinnorhea. Mouth/Throat: Mucous membranes are moist.  Oropharynx non-erythematous. Neck: No stridor.   Hematological/Lymphatic/Immunilogical: No cervical lymphadenopathy. Cardiovascular: Normal rate, regular rhythm. Grossly normal heart sounds.  Good peripheral circulation. Respiratory: Normal respiratory effort.  No retractions. Lungs CTAB. Musculoskeletal: No lower extremity tenderness nor  edema.  No joint effusions. Neurologic:  Normal speech and language. No gross focal neurologic deficits are appreciated. No gait instability. Skin:  Skin is warm, dry and intact. No rash noted. Psychiatric: Mood and affect are normal. Speech and behavior are normal.  ____________________________________________   LABS (all labs ordered are listed, but only abnormal results are displayed)  Labs Reviewed - No data to display   PROCEDURES  Procedure(s) performed: None  Procedures  Critical Care performed: No  ____________________________________________   INITIAL IMPRESSION / ASSESSMENT AND PLAN / ED COURSE  Pertinent labs & imaging results that were available during my care of the patient were reviewed by me and considered in my medical decision making (see chart for details).    Clinical Course    Patient was started on Tamiflu twice a day for 5 days. He is also given a prescription for Tessalon Perles to every 8 hours as needed for cough. He is encouraged to drink plenty of fluids and also take Tylenol or ibuprofen as needed for body aches. Patient was given a work note to remain out of work due to influenza.  ____________________________________________   FINAL CLINICAL IMPRESSION(S) / ED DIAGNOSES  Final diagnoses:  Influenza      NEW MEDICATIONS STARTED DURING THIS VISIT:  Discharge Medication List as of 05/05/2016  9:32 AM    START taking these medications   Details  benzonatate (TESSALON PERLES) 100 MG capsule Take 2 capsules (200 mg total) by mouth 3 (three) times daily as needed., Starting Sun 05/05/2016, Until Mon 05/05/2017, Print    oseltamivir (TAMIFLU) 75 MG capsule Take 1 capsule (75 mg total) by mouth 2 (two) times daily., Starting Sun 05/05/2016, Until Fri 05/10/2016, Print         Note:  This document was prepared using Dragon voice recognition software and may include unintentional dictation errors.    Tommi Rumpshonda L Bain Whichard, PA-C 05/05/16  1601    Minna AntisKevin Paduchowski, MD 05/06/16 2259

## 2016-05-05 NOTE — ED Triage Notes (Signed)
Pt arrives to ER via POV c/o body aches and nonproductive cough with fever. Pt wife dx with flu on Saturday. Pt alert and oriented X4, active, cooperative, pt in NAD. RR even and unlabored, color WNL.

## 2017-09-28 ENCOUNTER — Encounter: Payer: Self-pay | Admitting: Emergency Medicine

## 2017-09-28 ENCOUNTER — Other Ambulatory Visit: Payer: Self-pay

## 2017-09-28 ENCOUNTER — Emergency Department
Admission: EM | Admit: 2017-09-28 | Discharge: 2017-09-28 | Disposition: A | Discharge status: Home / Self Care | Payer: BLUE CROSS/BLUE SHIELD | Attending: Emergency Medicine | Admitting: Emergency Medicine

## 2017-09-28 DIAGNOSIS — Z79899 Other long term (current) drug therapy: Secondary | ICD-10-CM | POA: Diagnosis not present

## 2017-09-28 DIAGNOSIS — E119 Type 2 diabetes mellitus without complications: Secondary | ICD-10-CM | POA: Insufficient documentation

## 2017-09-28 DIAGNOSIS — F17210 Nicotine dependence, cigarettes, uncomplicated: Secondary | ICD-10-CM | POA: Diagnosis not present

## 2017-09-28 DIAGNOSIS — I10 Essential (primary) hypertension: Secondary | ICD-10-CM | POA: Insufficient documentation

## 2017-09-28 DIAGNOSIS — Y9389 Activity, other specified: Secondary | ICD-10-CM | POA: Diagnosis not present

## 2017-09-28 DIAGNOSIS — S3992XA Unspecified injury of lower back, initial encounter: Secondary | ICD-10-CM | POA: Diagnosis present

## 2017-09-28 DIAGNOSIS — X58XXXA Exposure to other specified factors, initial encounter: Secondary | ICD-10-CM | POA: Diagnosis not present

## 2017-09-28 DIAGNOSIS — S39012A Strain of muscle, fascia and tendon of lower back, initial encounter: Secondary | ICD-10-CM

## 2017-09-28 DIAGNOSIS — M5441 Lumbago with sciatica, right side: Secondary | ICD-10-CM | POA: Insufficient documentation

## 2017-09-28 DIAGNOSIS — Y999 Unspecified external cause status: Secondary | ICD-10-CM | POA: Insufficient documentation

## 2017-09-28 DIAGNOSIS — Y9289 Other specified places as the place of occurrence of the external cause: Secondary | ICD-10-CM | POA: Diagnosis not present

## 2017-09-28 HISTORY — DX: Type 2 diabetes mellitus without complications: E11.9

## 2017-09-28 HISTORY — DX: Pure hypercholesterolemia, unspecified: E78.00

## 2017-09-28 HISTORY — DX: Essential (primary) hypertension: I10

## 2017-09-28 MED ORDER — METHOCARBAMOL 500 MG PO TABS
1000.0000 mg | ORAL_TABLET | Freq: Once | ORAL | Status: AC
  Administered 2017-09-28: 1000 mg via ORAL
  Filled 2017-09-28: qty 2

## 2017-09-28 MED ORDER — HYDROCODONE-ACETAMINOPHEN 5-325 MG PO TABS
1.0000 | ORAL_TABLET | Freq: Once | ORAL | Status: AC
  Administered 2017-09-28: 1 via ORAL
  Filled 2017-09-28: qty 1

## 2017-09-28 MED ORDER — KETOROLAC TROMETHAMINE 10 MG PO TABS: 10.0000 mg | ORAL_TABLET | Freq: Four times a day (QID) | ORAL | 0 refills | Status: DC | PRN

## 2017-09-28 MED ORDER — METHOCARBAMOL 500 MG PO TABS: 500.0000 mg | ORAL_TABLET | Freq: Four times a day (QID) | ORAL | 0 refills | Status: DC

## 2017-09-28 MED ORDER — KETOROLAC TROMETHAMINE 30 MG/ML IJ SOLN
30.0000 mg | Freq: Once | INTRAMUSCULAR | Status: AC
  Administered 2017-09-28: 30 mg via INTRAMUSCULAR
  Filled 2017-09-28: qty 1

## 2017-09-28 MED ORDER — HYDROCODONE-ACETAMINOPHEN 5-325 MG PO TABS: 1.0000 | ORAL_TABLET | Freq: Four times a day (QID) | ORAL | 0 refills | Status: DC | PRN

## 2017-09-28 NOTE — Discharge Instructions (Signed)
Teen taking medication as directed.  Norco 1 every 6 hours as needed for pain, Toradol 10 mg every 6 hours with food for inflammation and pain and Robaxin 500 mg 4 times daily for muscle spasms.  You may use ice or heat to your back as needed for discomfort.  Do not take Norco or Robaxin if driving or operating machinery as this medication could cause drowsiness and increase your risk for injury.  Follow-up with your primary care provider if any continued problems.

## 2017-09-28 NOTE — ED Provider Notes (Signed)
[facility] Emergency Department Provider Note  ___________________________________________   First MD Initiated Contact with Patient 09/28/17 [id number]     (approximate)  I have reviewed the triage vital signs and the nursing notes.   HISTORY  Chief Complaint Back Pain  HPI Timothy Cameron is a 52 y.o. male is here with complaint of back pain for approximately 4 days.  Patient states that pain increased yesterday while he was fishing.  Patient states that he has had problems in the past with his back and is actually had sciatica.  He denies any recent injury.  There is been no incontinence of bowel or bladder.  Patient continues to ambulate without assistance.  Pain is increased with range of motion.  Currently he rates his pain as 5/10.  Past Medical History:  Diagnosis Date  . Diabetes (HCC)   . High cholesterol   . Hypertension     Patient Active Problem List   Diagnosis Date Noted  . Acute posthemorrhagic anemia 03/09/2016  . Thrombocytopenia (HCC) 03/09/2016  . Diabetes (HCC) 03/09/2016  . Tobacco abuse counseling 03/09/2016  . GIB (gastrointestinal bleeding) 03/08/2016  . Lower GI bleed     Past Surgical History:  Procedure Laterality Date  . ABDOMINAL SURGERY    . COLONOSCOPY N/A 03/09/2016   Procedure: COLONOSCOPY;  Surgeon: Charlott Rakes, MD;  Location: [facility] ENDOSCOPY;  Service: Endoscopy;  Laterality: N/A;    Prior to Admission medications   Medication Sig Start Date End Date Taking? Authorizing Provider  atorvastatin (LIPITOR) 20 MG tablet Take 20 mg by mouth at bedtime.   Yes [provider]  glipiZIDE (GLUCOTROL XL) 10 MG 24 hr tablet Take 10 mg by mouth at bedtime.   Yes [provider]  losartan (COZAAR) 25 MG tablet Take 25 mg by mouth daily.   Yes [provider]  psyllium (METAMUCIL) 58.6 % packet Take 1 packet by mouth at bedtime.   Yes [provider]  HYDROcodone-acetaminophen  (NORCO/VICODIN) 5-325 MG tablet Take 1 tablet by mouth every 6 (six) hours as needed for moderate pain. 09/28/17   Tommi Rumps, PA-C  ketorolac (TORADOL) 10 MG tablet Take 1 tablet (10 mg total) by mouth every 6 (six) hours as needed for moderate pain. 09/28/17   Tommi Rumps, PA-C  methocarbamol (ROBAXIN) 500 MG tablet Take 1 tablet (500 mg total) by mouth 4 (four) times daily. 09/28/17   Tommi Rumps, PA-C    Allergies Patient has no known allergies.  History reviewed. No pertinent family history.  Social History Social History   Tobacco Use  . Smoking status: Current Every Day Smoker    Packs/day: 2.00    Years: 5.00    Pack years: 10.00    Types: Cigars  . Smokeless tobacco: Never Used  Substance Use Topics  . Alcohol use: Yes  . Drug use: No    Review of Systems Constitutional: No fever/chills Cardiovascular: Denies chest pain. Respiratory: Denies shortness of breath. Gastrointestinal: No abdominal pain.   Genitourinary: Negative for dysuria. Musculoskeletal: Positive for low back pain. Skin: Negative for rash. Neurological: Negative for headaches, focal weakness or numbness. ___________________________________________   PHYSICAL EXAM:  VITAL SIGNS: ED Triage Vitals  Enc Vitals Group     BP 09/28/17 0652 (!) 155/101     Pulse Rate 09/28/17 0652 75     Resp 09/28/17 0652 17     Temp 09/28/17 0652 97.6 F (36.4 C)     Temp Source  09/28/17 0652 Oral     SpO2 09/28/17 0652 99 %     Weight 09/28/17 0653 225 lb (102.1 kg)     Height 09/28/17 0653 5\' 11"  (1.803 m)     Head Circumference --      Peak Flow --      Pain Score 09/28/17 0652 5     Pain Loc --      Pain Edu? --      Excl. in GC? --    Constitutional: Alert and oriented. Well appearing and in no acute distress. Eyes: Conjunctivae are normal.  Head: Atraumatic. Neck: No stridor.   Cardiovascular: Normal rate, regular rhythm. Grossly normal heart sounds.  Good peripheral  circulation. Respiratory: Normal respiratory effort.  No retractions. Lungs CTAB. Gastrointestinal: Soft and nontender.  No CVA tenderness. Musculoskeletal: On examination of the back there is no gross deformity noted.  There is no point tenderness on palpation of the thoracic or lumbar spine.  There is some diffuse tenderness on palpation of the SI joint area which reproduces his pain.  Straight leg raises are approximately 20 degrees in the right leg and 70 degrees in the left leg.  Good muscle strength bilaterally.  Patient is able to stand and walk without assistance.  Good muscle strength bilaterally. Neurologic:  Normal speech and language. No gross focal neurologic deficits are appreciated.  Reflexes are 1+ bilaterally.  No gait instability. Skin:  Skin is warm, dry and intact.  Psychiatric: Mood and affect are normal. Speech and behavior are normal.  ____________________________________________   LABS (all labs ordered are listed, but only abnormal results are displayed)  Labs Reviewed - No data to display  RADIOLOGY Lumbar x-rays were deferred since there was no acute injury.  PROCEDURES  Procedure(s) performed: None  Procedures  Critical Care performed: No  ____________________________________________   INITIAL IMPRESSION / ASSESSMENT AND PLAN / ED COURSE  As part of my medical decision making, I reviewed the following data within the electronic MEDICAL RECORD NUMBER Notes from prior ED visits and [place] Controlled Substance Database  Patient was given Toradol 30 mg IM, methocarbamol 1000 milligrams p.o. and Norco while in the ED.  Range of motion and pain improved.  Patient was discharged with the same medications.  He is encouraged to use ice or heat to his back as needed for discomfort.  He is to follow-up with his primary care provider if any continued problems. ____________________________________________   FINAL CLINICAL IMPRESSION(S) / ED DIAGNOSES  Final diagnoses:   Acute right-sided low back pain with right-sided sciatica  Strain of lumbar region, initial encounter     ED Discharge Orders        Ordered    methocarbamol (ROBAXIN) 500 MG tablet  4 times daily     09/28/17 0814    HYDROcodone-acetaminophen (NORCO/VICODIN) 5-325 MG tablet  Every 6 hours PRN     09/28/17 0814    ketorolac (TORADOL) 10 MG tablet  Every 6 hours PRN     09/28/17 [id number]       Note:  This document was prepared using Dragon voice recognition software and may include unintentional dictation errors.    Tommi RumpsSummers, Rhonda L, PA-C 09/28/17 1139    Jene EveryKinner, Robert, MD 09/28/17 1246

## 2017-09-28 NOTE — ED Notes (Signed)
NAD noted at time of D/C. Pt denies questions or concerns. Pt ambulatory to the lobby at this time.  

## 2017-09-28 NOTE — ED Triage Notes (Signed)
Pt c/o right lower back pain since Wednesday; says pain increased yesterday when he was fishing when he went to pull up the anchor; pt denies injury on Wednesday; history of similar pain

## 2017-09-29 ENCOUNTER — Encounter: Payer: Self-pay | Admitting: Emergency Medicine

## 2017-09-29 ENCOUNTER — Emergency Department
Admission: EM | Admit: 2017-09-29 | Discharge: 2017-09-29 | Disposition: A | Discharge status: Home / Self Care | Payer: BLUE CROSS/BLUE SHIELD | Attending: Emergency Medicine | Admitting: Emergency Medicine

## 2017-09-29 ENCOUNTER — Emergency Department: Payer: BLUE CROSS/BLUE SHIELD

## 2017-09-29 DIAGNOSIS — M47816 Spondylosis without myelopathy or radiculopathy, lumbar region: Secondary | ICD-10-CM | POA: Diagnosis not present

## 2017-09-29 DIAGNOSIS — E119 Type 2 diabetes mellitus without complications: Secondary | ICD-10-CM | POA: Insufficient documentation

## 2017-09-29 DIAGNOSIS — Z79899 Other long term (current) drug therapy: Secondary | ICD-10-CM | POA: Insufficient documentation

## 2017-09-29 DIAGNOSIS — T148XXA Other injury of unspecified body region, initial encounter: Secondary | ICD-10-CM

## 2017-09-29 DIAGNOSIS — Y998 Other external cause status: Secondary | ICD-10-CM | POA: Insufficient documentation

## 2017-09-29 DIAGNOSIS — Y9389 Activity, other specified: Secondary | ICD-10-CM | POA: Diagnosis not present

## 2017-09-29 DIAGNOSIS — S39012A Strain of muscle, fascia and tendon of lower back, initial encounter: Secondary | ICD-10-CM | POA: Insufficient documentation

## 2017-09-29 DIAGNOSIS — Y929 Unspecified place or not applicable: Secondary | ICD-10-CM | POA: Diagnosis not present

## 2017-09-29 DIAGNOSIS — F17290 Nicotine dependence, other tobacco product, uncomplicated: Secondary | ICD-10-CM | POA: Insufficient documentation

## 2017-09-29 DIAGNOSIS — X500XXA Overexertion from strenuous movement or load, initial encounter: Secondary | ICD-10-CM | POA: Insufficient documentation

## 2017-09-29 DIAGNOSIS — I10 Essential (primary) hypertension: Secondary | ICD-10-CM | POA: Insufficient documentation

## 2017-09-29 DIAGNOSIS — S3992XA Unspecified injury of lower back, initial encounter: Secondary | ICD-10-CM | POA: Diagnosis present

## 2017-09-29 DIAGNOSIS — Z7984 Long term (current) use of oral hypoglycemic drugs: Secondary | ICD-10-CM | POA: Insufficient documentation

## 2017-09-29 LAB — URINALYSIS, COMPLETE (UACMP) WITH MICROSCOPIC
BILIRUBIN URINE: NEGATIVE
Bacteria, UA: NONE SEEN
GLUCOSE, UA: 50 mg/dL — AB
HGB URINE DIPSTICK: NEGATIVE
KETONES UR: NEGATIVE mg/dL
LEUKOCYTES UA: NEGATIVE
NITRITE: NEGATIVE
PH: 6 (ref 5.0–8.0)
Protein, ur: NEGATIVE mg/dL
Specific Gravity, Urine: 1.021 (ref 1.005–1.030)

## 2017-09-29 LAB — GLUCOSE, CAPILLARY: Glucose-Capillary: 111 mg/dL — ABNORMAL HIGH (ref 65–99)

## 2017-09-29 MED ORDER — PREDNISONE 10 MG PO TABS: ORAL_TABLET | ORAL | 0 refills | Status: DC

## 2017-09-29 MED ORDER — OXYCODONE-ACETAMINOPHEN 5-325 MG PO TABS
1.0000 | ORAL_TABLET | Freq: Once | ORAL | Status: AC
  Administered 2017-09-29: 1 via ORAL
  Filled 2017-09-29: qty 1

## 2017-09-29 MED ORDER — PREDNISONE 20 MG PO TABS
60.0000 mg | ORAL_TABLET | Freq: Once | ORAL | Status: AC
  Administered 2017-09-29: 60 mg via ORAL
  Filled 2017-09-29: qty 3

## 2017-09-29 NOTE — ED Notes (Signed)
fsbs 111.

## 2017-09-29 NOTE — ED Provider Notes (Signed)
[facility] Emergency Department Provider Note  ____________________________________________  Time seen: Approximately 9:02 PM  I have reviewed the triage vital signs and the nursing notes.   HISTORY  Chief Complaint Back Pain    HPI Timothy Cameron is a 52 y.o. male that presents emergency department for evaluation of  worsening back pain for 5 days.  Pain is primarily over his right low back.  It does not radiate.  Patient was pulling up an anchor while fishing on Saturday when back pain initially increased.  He was evaluated in the emergency department yesterday and was given a prescription for Percocet, Toradol, Robaxin.  Patient took the 3 medications about 6 hours ago.  Back pain has not improved.   No bowel or bladder dysfunction or saddle paresthesias.  No IV drug use.  No fever, chills, weight loss, nausea, vomiting, abdominal pain, radicular pain.   Past Medical History:  Diagnosis Date  . Diabetes (HCC)   . High cholesterol   . Hypertension     Patient Active Problem List   Diagnosis Date Noted  . Acute posthemorrhagic anemia 03/09/2016  . Thrombocytopenia (HCC) 03/09/2016  . Diabetes (HCC) 03/09/2016  . Tobacco abuse counseling 03/09/2016  . GIB (gastrointestinal bleeding) 03/08/2016  . Lower GI bleed     Past Surgical History:  Procedure Laterality Date  . ABDOMINAL SURGERY    . COLONOSCOPY N/A 03/09/2016   Procedure: COLONOSCOPY;  Surgeon: Charlott Rakes, MD;  Location: [facility] ENDOSCOPY;  Service: Endoscopy;  Laterality: N/A;    Prior to Admission medications   Medication Sig Start Date End Date Taking? Authorizing Provider  atorvastatin (LIPITOR) 20 MG tablet Take 20 mg by mouth at bedtime.    [provider]  glipiZIDE (GLUCOTROL XL) 10 MG 24 hr tablet Take 10 mg by mouth at bedtime.    [provider]  HYDROcodone-acetaminophen (NORCO/VICODIN) 5-325 MG tablet Take 1 tablet by mouth every 6 (six) hours as needed  for moderate pain. 09/28/17   Tommi Rumps, PA-C  ketorolac (TORADOL) 10 MG tablet Take 1 tablet (10 mg total) by mouth every 6 (six) hours as needed for moderate pain. 09/28/17   Tommi Rumps, PA-C  losartan (COZAAR) 25 MG tablet Take 25 mg by mouth daily.    [provider]  methocarbamol (ROBAXIN) 500 MG tablet Take 1 tablet (500 mg total) by mouth 4 (four) times daily. 09/28/17   Tommi Rumps, PA-C  predniSONE (DELTASONE) 10 MG tablet Take 6 tablets on day 1, take 5 tablets on day 2, take 4 tablets on day 3, take 3 tablets on day 4, take 2 tablets on day 5, take 1 tablet on day 6 09/29/17   Enid Derry, PA-C  psyllium (METAMUCIL) 58.6 % packet Take 1 packet by mouth at bedtime.    [provider]    Allergies Patient has no known allergies.  History reviewed. No pertinent family history.  Social History Social History   Tobacco Use  . Smoking status: Current Every Day Smoker    Packs/day: 2.00    Years: 5.00    Pack years: 10.00    Types: Cigars  . Smokeless tobacco: Never Used  Substance Use Topics  . Alcohol use: Yes  . Drug use: No     Review of Systems  Constitutional: No fever/chills Cardiovascular: No chest pain. Respiratory: No SOB. Gastrointestinal: No abdominal pain.  No nausea, no vomiting.  Genitourinary: Negative for dysuria. Musculoskeletal: Positive for back pain.  Skin: Negative for rash, abrasions, lacerations, ecchymosis. Neurological: Negative for headaches, numbness or tingling   ____________________________________________   PHYSICAL EXAM:  VITAL SIGNS: ED Triage Vitals [09/29/17 2038]  Enc Vitals Group     BP (!) 158/92     Pulse Rate 76     Resp 17     Temp 98 F (36.7 C)     Temp Source Oral     SpO2 97 %     Weight 225 lb (102.1 kg)     Height      Head Circumference      Peak Flow      Pain Score      Pain Loc      Pain Edu?      Excl. in GC?      Constitutional: Alert and oriented. Well  appearing and in no acute distress. Eyes: Conjunctivae are normal. PERRL. EOMI. Head: Atraumatic. ENT:      Ears:      Nose: No congestion/rhinnorhea.      Mouth/Throat: Mucous membranes are moist.  Neck: No stridor. Cardiovascular: Normal rate, regular rhythm.  Good peripheral circulation. Respiratory: Normal respiratory effort without tachypnea or retractions. Lungs CTAB. Good air entry to the bases with no decreased or absent breath sounds. Gastrointestinal: Bowel sounds 4 quadrants. Soft and nontender to palpation. No guarding or rigidity. No palpable masses. No distention. No CVA tenderness. Musculoskeletal: Full range of motion to all extremities. No gross deformities appreciated.  Tenderness to palpation of right lumbar paraspinal muscles.  No tenderness to palpation over lumbar spine.  Negative straight leg raise.  Strength equal in lower extremity's bilaterally. Neurologic:  Normal speech and language. No gross focal neurologic deficits are appreciated.  Skin:  Skin is warm, dry and intact. No rash noted. Psychiatric: Mood and affect are normal. Speech and behavior are normal. Patient exhibits appropriate insight and judgement.   ____________________________________________   LABS (all labs ordered are listed, but only abnormal results are displayed)  Labs Reviewed  URINALYSIS, COMPLETE (UACMP) WITH MICROSCOPIC - Abnormal; Notable for the following components:      Result Value   Color, Urine YELLOW (*)    APPearance CLEAR (*)    Glucose, UA 50 (*)    All other components within normal limits  GLUCOSE, CAPILLARY - Abnormal; Notable for the following components:   Glucose-Capillary 111 (*)    All other components within normal limits  CBG MONITORING, ED   ____________________________________________  EKG   ____________________________________________  RADIOLOGY Lexine BatonI, Elisa Kutner, personally viewed and evaluated these images (plain radiographs) as part of my medical  decision making, as well as reviewing the written report by the radiologist.  Dg Lumbar Spine Complete  Result Date: 09/29/2017 CLINICAL DATA:  Right lower back pain since last Wednesday. EXAM: LUMBAR SPINE - COMPLETE 4+ VIEW COMPARISON:  None. FINDINGS: Degenerative mild disc space narrowing T10-11, T11-12, and L1-2 noted. No acute fracture or listhesis. Degenerative facet arthropathy with joint space narrowing, sclerosis and hypertrophy from L2 through S1. No pars defects. No suspicious osseous lesions. Five non ribbed lumbar vertebrae are noted. Included sacroiliac joints are nonacute. IMPRESSION: 1. Mild degenerative disc space narrowing T10-11, T11-12 and L1-2. No acute osseous abnormality. 2. Multilevel mild degenerative facet arthropathy of the lumbar spine. Electronically Signed   By: Tollie Ethavid  Kwon M.D.   On: 09/29/2017 21:55    ____________________________________________    PROCEDURES  Procedure(s) performed:    Procedures    Medications  predniSONE (DELTASONE) tablet 60  mg (60 mg Oral Given 09/29/17 2320)  oxyCODONE-acetaminophen (PERCOCET/ROXICET) 5-325 MG per tablet 1 tablet (1 tablet Oral Given 09/29/17 2320)     ____________________________________________   INITIAL IMPRESSION / ASSESSMENT AND PLAN / ED COURSE  Pertinent labs & imaging results that were available during my care of the patient were reviewed by me and considered in my medical decision making (see chart for details).  Review of the [place] CSRS was performed in accordance of the [facility] prior to dispensing any controlled drugs.   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 Vital signs and exam are reassuring. Patient will continue toradol, flexeril, percocet. Patient will be discharged home with prescriptions for prednisone. Patient is to follow up with spine doctor and PCP as directed. Patient is given ED precautions to return to the ED for any worsening or new  symptoms.     ____________________________________________  FINAL CLINICAL IMPRESSION(S) / ED DIAGNOSES  Final diagnoses:  Muscle strain  Spondylosis of lumbar region without myelopathy or radiculopathy      NEW MEDICATIONS STARTED DURING THIS VISIT:  ED Discharge Orders        Ordered    predniSONE (DELTASONE) 10 MG tablet     09/29/17 2310          This chart was dictated using voice recognition software/Dragon. Despite best efforts to proofread, errors can occur which can change the meaning. Any change was purely unintentional.    Enid Derry, PA-C 09/29/17 2340    Merrily Brittle, MD 09/30/17 1335

## 2017-09-29 NOTE — ED Notes (Signed)
Pt has right lower back pain.  Pt was seen in the er last week with similar sx.  Taking pain meds and muscle relaxer without relief.  Denies urinary sx.  No n/v/  Pt alert.

## 2017-09-29 NOTE — ED Triage Notes (Signed)
Pt c/o right lower back pain since last Wednesday, was seen in ED for symptoms on Sunday and dx with muscle pull, sent home with medication. Pt to ED today due to worsening and unresolved symptoms. Pt denies urinary symptoms.

## 2017-11-04 ENCOUNTER — Ambulatory Visit
Admission: RE | Admit: 2017-11-04 | Discharge: 2017-11-04 | Disposition: A | Discharge status: Home / Self Care | Payer: BLUE CROSS/BLUE SHIELD | Source: Ambulatory Visit | Attending: Family Medicine | Admitting: Family Medicine

## 2017-11-04 ENCOUNTER — Other Ambulatory Visit: Payer: Self-pay | Admitting: Family Medicine

## 2017-11-04 DIAGNOSIS — R059 Cough, unspecified: Secondary | ICD-10-CM

## 2017-11-04 DIAGNOSIS — R05 Cough: Secondary | ICD-10-CM | POA: Insufficient documentation

## 2017-11-09 ENCOUNTER — Emergency Department: Payer: BLUE CROSS/BLUE SHIELD

## 2017-11-09 ENCOUNTER — Other Ambulatory Visit: Payer: Self-pay

## 2017-11-09 ENCOUNTER — Emergency Department
Admission: EM | Admit: 2017-11-09 | Discharge: 2017-11-09 | Disposition: A | Discharge status: Home / Self Care | Payer: BLUE CROSS/BLUE SHIELD | Attending: Emergency Medicine | Admitting: Emergency Medicine

## 2017-11-09 DIAGNOSIS — J40 Bronchitis, not specified as acute or chronic: Secondary | ICD-10-CM | POA: Insufficient documentation

## 2017-11-09 DIAGNOSIS — F17210 Nicotine dependence, cigarettes, uncomplicated: Secondary | ICD-10-CM | POA: Diagnosis not present

## 2017-11-09 DIAGNOSIS — E119 Type 2 diabetes mellitus without complications: Secondary | ICD-10-CM | POA: Insufficient documentation

## 2017-11-09 DIAGNOSIS — I10 Essential (primary) hypertension: Secondary | ICD-10-CM | POA: Insufficient documentation

## 2017-11-09 DIAGNOSIS — Z7984 Long term (current) use of oral hypoglycemic drugs: Secondary | ICD-10-CM | POA: Diagnosis not present

## 2017-11-09 DIAGNOSIS — R05 Cough: Secondary | ICD-10-CM | POA: Diagnosis present

## 2017-11-09 MED ORDER — IPRATROPIUM-ALBUTEROL 0.5-2.5 (3) MG/3ML IN SOLN
3.0000 mL | Freq: Once | RESPIRATORY_TRACT | Status: AC
  Administered 2017-11-09: 3 mL via RESPIRATORY_TRACT
  Filled 2017-11-09: qty 3

## 2017-11-09 MED ORDER — GUAIFENESIN-CODEINE 100-10 MG/5ML PO SYRP: 5.0000 mL | ORAL_SOLUTION | Freq: Three times a day (TID) | ORAL | 0 refills | Status: AC | PRN

## 2017-11-09 MED ORDER — ALBUTEROL SULFATE (2.5 MG/3ML) 0.083% IN NEBU: 2.5000 mg | INHALATION_SOLUTION | Freq: Four times a day (QID) | RESPIRATORY_TRACT | 12 refills | Status: DC | PRN

## 2017-11-09 MED ORDER — DOXYCYCLINE HYCLATE 50 MG PO CAPS: 100.0000 mg | ORAL_CAPSULE | Freq: Two times a day (BID) | ORAL | 0 refills | Status: AC

## 2017-11-09 MED ORDER — PREDNISONE 10 MG PO TABS: ORAL_TABLET | ORAL | 0 refills | Status: DC

## 2017-11-09 NOTE — ED Provider Notes (Signed)
[facility] Emergency Department Provider Note  ____________________________________________  Time seen: Approximately 7:32 AM  I have reviewed the triage vital signs and the nursing notes.   HISTORY  Chief Complaint Cough    HPI Timothy Cameron is a 52 y.o. male that presents to the emergency department for evaluation of productive cough for 2 weeks. Cough is occasionally productive with yellow and green sputum. Usually it is a dry, hacking cough. Coughing is worse at night. Patient smokes 12-15 black and mild cigars for 10 years. 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 He was given a zpak 2 weeks ago, which he finished. He went back to primary care last week and was given a rescue inhaler, flonase and Zyrtec. His blood sugars are well controlled and usually about 120. No fever, chills.    Past Medical History:  Diagnosis Date  . Diabetes (HCC)   . High cholesterol   . Hypertension     Patient Active Problem List   Diagnosis Date Noted  . Acute posthemorrhagic anemia 03/09/2016  . Thrombocytopenia (HCC) 03/09/2016  . Diabetes (HCC) 03/09/2016  . Tobacco abuse counseling 03/09/2016  . GIB (gastrointestinal bleeding) 03/08/2016  . Lower GI bleed     Past Surgical History:  Procedure Laterality Date  . ABDOMINAL SURGERY    . COLONOSCOPY N/A 03/09/2016   Procedure: COLONOSCOPY;  Surgeon: Charlott Rakes, MD;  Location: [facility] ENDOSCOPY;  Service: Endoscopy;  Laterality: N/A;    Prior to Admission medications   Medication Sig Start Date End Date Taking? Authorizing Provider  albuterol (PROVENTIL) (2.5 MG/3ML) 0.083% nebulizer solution Take 3 mLs (2.5 mg total) by nebulization every 6 (six) hours as needed for wheezing or shortness of breath. 11/09/17   Enid Derry, PA-C  atorvastatin (LIPITOR) 20 MG tablet Take 20 mg by mouth at bedtime.    [provider]  doxycycline (VIBRAMYCIN) 50 MG capsule Take 2 capsules (100 mg total)  by mouth 2 (two) times daily for 10 days. 11/09/17 11/19/17  Enid Derry, PA-C  glipiZIDE (GLUCOTROL XL) 10 MG 24 hr tablet Take 10 mg by mouth at bedtime.    [provider]  guaiFENesin-codeine (ROBITUSSIN AC) 100-10 MG/5ML syrup Take 5 mLs by mouth 3 (three) times daily as needed for up to 2 days for cough. 11/09/17 11/11/17  Enid Derry, PA-C  HYDROcodone-acetaminophen (NORCO/VICODIN) 5-325 MG tablet Take 1 tablet by mouth every 6 (six) hours as needed for moderate pain. 09/28/17   Tommi Rumps, PA-C  ketorolac (TORADOL) 10 MG tablet Take 1 tablet (10 mg total) by mouth every 6 (six) hours as needed for moderate pain. 09/28/17   Tommi Rumps, PA-C  losartan (COZAAR) 25 MG tablet Take 25 mg by mouth daily.    [provider]  methocarbamol (ROBAXIN) 500 MG tablet Take 1 tablet (500 mg total) by mouth 4 (four) times daily. 09/28/17   Tommi Rumps, PA-C  predniSONE (DELTASONE) 10 MG tablet Take 6 tablets on day 1, take 5 tablets on day 2, take 4 tablets on day 3, take 3 tablets on day 4, take 2 tablets on day 5, take 1 tablet on day 6 11/09/17   Enid Derry, PA-C  psyllium (METAMUCIL) 58.6 % packet Take 1 packet by mouth at bedtime.    [provider]    Allergies Patient has no known allergies.  No family history on file.  Social History Social History   Tobacco Use  . Smoking  status: Current Every Day Smoker    Packs/day: 2.00    Years: 5.00    Pack years: 10.00    Types: Cigars  . Smokeless tobacco: Never Used  Substance Use Topics  . Alcohol use: Yes  . Drug use: No     Review of Systems  Constitutional: No fever/chills Eyes: No visual changes. No discharge. ENT: Negative for congestion and rhinorrhea. Cardiovascular: No chest pain. Respiratory: Positive for cough. No SOB. Gastrointestinal: No abdominal pain.  No nausea, no vomiting.  No diarrhea.  No constipation. Musculoskeletal: Negative for musculoskeletal pain. Skin: Negative  for rash, abrasions, lacerations, ecchymosis. Neurological: Negative for headaches.   ____________________________________________   PHYSICAL EXAM:  VITAL SIGNS: ED Triage Vitals  Enc Vitals Group     BP 11/09/17 0624 (!) 149/93     Pulse Rate 11/09/17 0624 70     Resp 11/09/17 0624 18     Temp 11/09/17 0624 97.7 F (36.5 C)     Temp Source 11/09/17 0624 Oral     SpO2 11/09/17 0624 98 %     Weight 11/09/17 0625 227 lb (103 kg)     Height 11/09/17 0625 5\' 11"  (1.803 m)     Head Circumference --      Peak Flow --      Pain Score 11/09/17 0715 0     Pain Loc --      Pain Edu? --      Excl. in GC? --      Constitutional: Alert and oriented. Well appearing and in no acute distress. Eyes: Conjunctivae are normal. PERRL. EOMI. No discharge. Head: Atraumatic. ENT: No frontal and maxillary sinus tenderness.      Ears: Tympanic membranes pearly gray with good landmarks. No discharge.      Nose: No congestion/rhinnorhea.      Mouth/Throat: Mucous membranes are moist. Oropharynx non-erythematous. Tonsils not enlarged. No exudates. Uvula midline. Neck: No stridor.   Hematological/Lymphatic/Immunilogical: No cervical lymphadenopathy. Cardiovascular: Normal rate, regular rhythm.  Good peripheral circulation. Respiratory: Normal respiratory effort without tachypnea or retractions. Scattered wheezes. Good air entry to the bases with no decreased or absent breath sounds. Gastrointestinal: Bowel sounds 4 quadrants. Soft and nontender to palpation. No guarding or rigidity. No palpable masses. No distention. Musculoskeletal: Full range of motion to all extremities. No gross deformities appreciated. Neurologic:  Normal speech and language. No gross focal neurologic deficits are appreciated.  Skin:  Skin is warm, dry and intact. No rash noted. Psychiatric: Mood and affect are normal. Speech and behavior are normal. Patient exhibits appropriate insight and  judgement.   ____________________________________________   LABS (all labs ordered are listed, but only abnormal results are displayed)  Labs Reviewed - No data to display ____________________________________________  EKG   ____________________________________________  RADIOLOGY Lexine Baton, personally viewed and evaluated these images (plain radiographs) as part of my medical decision making, as well as reviewing the written report by the radiologist.  Dg Chest 2 View  Result Date: 11/09/2017 CLINICAL DATA:  Productive cough for the past 2 weeks.  Smoker. EXAM: CHEST - 2 VIEW COMPARISON:  11/04/2017. FINDINGS: Normal sized heart. Clear lungs. Mild thoracic spine degenerative changes. IMPRESSION: No acute abnormality. Electronically Signed   By: Beckie Salts M.D.   On: 11/09/2017 08:01    ____________________________________________    PROCEDURES  Procedure(s) performed:    Procedures    Medications  ipratropium-albuterol (DUONEB) 0.5-2.5 (3) MG/3ML nebulizer solution 3 mL (3 mLs Nebulization Given 11/09/17 0856)  ____________________________________________   INITIAL IMPRESSION / ASSESSMENT AND PLAN / ED COURSE  Pertinent labs & imaging results that were available during my care of the patient were reviewed by me and considered in my medical decision making (see chart for details).  Review of the [place] CSRS was performed in accordance of the [facility] prior to dispensing any controlled drugs.     Patient's diagnosis is consistent with Bronchitis. Vital signs and exam are reassuring. Chest xray today negative for acute cardiopulmonary process per radiology. Duoneb was given. Patient appears well and is staying well hydrated. Patient feels comfortable going home. Patient will be discharged home with prescriptions for doxycycline, prednisone, robitussin. Patient is to follow up with PCP as needed or otherwise directed. Patient is given ED precautions to return to  the ED for any worsening or new symptoms.     ____________________________________________  FINAL CLINICAL IMPRESSION(S) / ED DIAGNOSES  Final diagnoses:  Bronchitis      NEW MEDICATIONS STARTED DURING THIS VISIT:  ED Discharge Orders        Ordered    doxycycline (VIBRAMYCIN) 50 MG capsule  2 times daily     11/09/17 0906    predniSONE (DELTASONE) 10 MG tablet     11/09/17 0906    guaiFENesin-codeine (ROBITUSSIN AC) 100-10 MG/5ML syrup  3 times daily PRN     11/09/17 0906    albuterol (PROVENTIL) (2.5 MG/3ML) 0.083% nebulizer solution  Every 6 hours PRN     11/09/17 0910          This chart was dictated using voice recognition software/Dragon. Despite best efforts to proofread, errors can occur which can change the meaning. Any change was purely unintentional.    Enid DerryWagner, Rahcel Shutes, PA-C 11/09/17 1514    Pershing ProudSchaevitz, Myra Rudeavid Matthew, MD 11/09/17 [phone]

## 2017-11-09 NOTE — ED Triage Notes (Addendum)
Patient reports cough since around 10/23/17. Reports noticed a green color today.  Patient is a smoker.  Patient speaking in complete sentences without difficulty or distress.

## 2017-11-09 NOTE — ED Notes (Signed)
Spoke with Dr. Dolores FrameSung, no orders at this time.

## 2017-11-09 NOTE — ED Notes (Signed)

## 2018-01-04 ENCOUNTER — Encounter: Payer: Self-pay | Admitting: Emergency Medicine

## 2018-01-04 ENCOUNTER — Other Ambulatory Visit: Payer: Self-pay

## 2018-01-04 ENCOUNTER — Emergency Department
Admission: EM | Admit: 2018-01-04 | Discharge: 2018-01-04 | Disposition: A | Discharge status: Home / Self Care | Payer: BLUE CROSS/BLUE SHIELD | Attending: Emergency Medicine | Admitting: Emergency Medicine

## 2018-01-04 ENCOUNTER — Emergency Department: Payer: BLUE CROSS/BLUE SHIELD

## 2018-01-04 DIAGNOSIS — Z7984 Long term (current) use of oral hypoglycemic drugs: Secondary | ICD-10-CM | POA: Insufficient documentation

## 2018-01-04 DIAGNOSIS — I10 Essential (primary) hypertension: Secondary | ICD-10-CM | POA: Diagnosis not present

## 2018-01-04 DIAGNOSIS — R1084 Generalized abdominal pain: Secondary | ICD-10-CM | POA: Insufficient documentation

## 2018-01-04 DIAGNOSIS — Z87891 Personal history of nicotine dependence: Secondary | ICD-10-CM | POA: Diagnosis not present

## 2018-01-04 DIAGNOSIS — E7800 Pure hypercholesterolemia, unspecified: Secondary | ICD-10-CM | POA: Insufficient documentation

## 2018-01-04 DIAGNOSIS — Z79899 Other long term (current) drug therapy: Secondary | ICD-10-CM | POA: Diagnosis not present

## 2018-01-04 DIAGNOSIS — R197 Diarrhea, unspecified: Secondary | ICD-10-CM | POA: Diagnosis not present

## 2018-01-04 DIAGNOSIS — E119 Type 2 diabetes mellitus without complications: Secondary | ICD-10-CM | POA: Diagnosis not present

## 2018-01-04 DIAGNOSIS — R109 Unspecified abdominal pain: Secondary | ICD-10-CM

## 2018-01-04 LAB — URINALYSIS, COMPLETE (UACMP) WITH MICROSCOPIC
Bacteria, UA: NONE SEEN
Bilirubin Urine: NEGATIVE
Glucose, UA: NEGATIVE mg/dL
Hgb urine dipstick: NEGATIVE
Ketones, ur: NEGATIVE mg/dL
LEUKOCYTES UA: NEGATIVE
Nitrite: NEGATIVE
PH: 5 (ref 5.0–8.0)
Protein, ur: NEGATIVE mg/dL
SPECIFIC GRAVITY, URINE: 1.023 (ref 1.005–1.030)
SQUAMOUS EPITHELIAL / LPF: NONE SEEN (ref 0–5)

## 2018-01-04 LAB — COMPREHENSIVE METABOLIC PANEL
ALT: 47 U/L — ABNORMAL HIGH (ref 0–44)
ANION GAP: 6 (ref 5–15)
AST: 31 U/L (ref 15–41)
Albumin: 4.1 g/dL (ref 3.5–5.0)
Alkaline Phosphatase: 50 U/L (ref 38–126)
BILIRUBIN TOTAL: 0.6 mg/dL (ref 0.3–1.2)
BUN: 12 mg/dL (ref 6–20)
CO2: 28 mmol/L (ref 22–32)
Calcium: 9.2 mg/dL (ref 8.9–10.3)
Chloride: 103 mmol/L (ref 98–111)
Creatinine, Ser: 1.02 mg/dL (ref 0.61–1.24)
Glucose, Bld: 156 mg/dL — ABNORMAL HIGH (ref 70–99)
Potassium: 3.7 mmol/L (ref 3.5–5.1)
Sodium: 137 mmol/L (ref 135–145)
TOTAL PROTEIN: 7.2 g/dL (ref 6.5–8.1)

## 2018-01-04 LAB — CBC
HEMATOCRIT: 42.1 % (ref 40.0–52.0)
HEMOGLOBIN: 15.6 g/dL (ref 13.0–18.0)
MCH: 33.3 pg (ref 26.0–34.0)
MCHC: 37.1 g/dL — ABNORMAL HIGH (ref 32.0–36.0)
MCV: 89.7 fL (ref 80.0–100.0)
Platelets: 182 10*3/uL (ref 150–440)
RBC: 4.69 MIL/uL (ref 4.40–5.90)
RDW: 12.6 % (ref 11.5–14.5)
WBC: 9.2 10*3/uL (ref 3.8–10.6)

## 2018-01-04 LAB — LIPASE, BLOOD: Lipase: 33 U/L (ref 11–51)

## 2018-01-04 MED ORDER — SODIUM CHLORIDE 0.9 % IV BOLUS
1000.0000 mL | Freq: Once | INTRAVENOUS | Status: AC
  Administered 2018-01-04: 1000 mL via INTRAVENOUS

## 2018-01-04 NOTE — ED Notes (Signed)
DC instructions discussed with patient and wife, questions answered, pt instructed to follow up with PCP in the next couple of days.

## 2018-01-04 NOTE — ED Triage Notes (Addendum)
Pt arrived ED via POV. Pt c/o left side abd pain 5/10 and intermittent dizzness. Pt states he had abd surgery for GI bleed in the past. Per pt he has not noticed blood in stool/urine. Pt denies N/V. Pt states he has diarrhea since 9/13. Pt states he takes stool softeners. Pt state he takes Ibuprofen daily for pain. NAD note. VSS.

## 2018-01-04 NOTE — Discharge Instructions (Addendum)

## 2018-01-04 NOTE — ED Provider Notes (Signed)
[facility] Emergency Department Provider Note  ____________________________________________  Time seen: Approximately 4:57 PM  I have reviewed the triage vital signs and the nursing notes.   HISTORY  Chief Complaint Abdominal Pain   HPI Timothy Cameron is a 52 y.o. male with a history of diabetes, hypertension, hyperlipidemia, SBO status post bowel resection who presents for evaluation of abdominal pain.  Patient reports 1 week of crampy intermittent diffuse abdominal pain currently mild.  Patient reports that he usually has 3-4 episodes of soft bowel movements a day since he takes a lot of fiber and stool softeners since having his ostomy takedown. Initially when the abdominal pain started, he had 3 days of "watery explosive bowel movements" x2 times a day. The diarrhea resolved and re-started 2 days ago.  No melena, hematemesis, coffee-ground emesis, or hematochezia.  He endorses mild nausea but no vomiting.  No fever or chills, no dysuria or hematuria.  No recent antibiotic use or history of C. Difficile. No recent travel. Patient reports feeling fatigued.   Past Medical History:  Diagnosis Date  . Diabetes (HCC)   . High cholesterol   . Hypertension     Patient Active Problem List   Diagnosis Date Noted  . Acute posthemorrhagic anemia 03/09/2016  . Thrombocytopenia (HCC) 03/09/2016  . Diabetes (HCC) 03/09/2016  . Tobacco abuse counseling 03/09/2016  . GIB (gastrointestinal bleeding) 03/08/2016  . Lower GI bleed     Past Surgical History:  Procedure Laterality Date  . ABDOMINAL SURGERY    . COLONOSCOPY N/A 03/09/2016   Procedure: COLONOSCOPY;  Surgeon: Charlott Rakes, MD;  Location: [facility] ENDOSCOPY;  Service: Endoscopy;  Laterality: N/A;    Prior to Admission medications   Medication Sig Start Date End Date Taking? Authorizing Provider  albuterol (PROVENTIL) (2.5 MG/3ML) 0.083% nebulizer solution Take 3 mLs (2.5 mg total) by nebulization  every 6 (six) hours as needed for wheezing or shortness of breath. 11/09/17   Enid Derry, PA-C  atorvastatin (LIPITOR) 20 MG tablet Take 20 mg by mouth at bedtime.    [provider]  glipiZIDE (GLUCOTROL XL) 10 MG 24 hr tablet Take 10 mg by mouth at bedtime.    [provider]  HYDROcodone-acetaminophen (NORCO/VICODIN) 5-325 MG tablet Take 1 tablet by mouth every 6 (six) hours as needed for moderate pain. 09/28/17   Tommi Rumps, PA-C  ketorolac (TORADOL) 10 MG tablet Take 1 tablet (10 mg total) by mouth every 6 (six) hours as needed for moderate pain. 09/28/17   Tommi Rumps, PA-C  losartan (COZAAR) 25 MG tablet Take 25 mg by mouth daily.    [provider]  methocarbamol (ROBAXIN) 500 MG tablet Take 1 tablet (500 mg total) by mouth 4 (four) times daily. 09/28/17   Tommi Rumps, PA-C  predniSONE (DELTASONE) 10 MG tablet Take 6 tablets on day 1, take 5 tablets on day 2, take 4 tablets on day 3, take 3 tablets on day 4, take 2 tablets on day 5, take 1 tablet on day 6 11/09/17   Enid Derry, PA-C  psyllium (METAMUCIL) 58.6 % packet Take 1 packet by mouth at bedtime.    [provider]    Allergies Patient has no known allergies.  History reviewed. No pertinent family history.  Social History Social History   Tobacco Use  . Smoking status: Former Smoker    Types: Cigars    Last attempt to quit: 10/23/2017    Years since quitting: 0.2  .  Smokeless tobacco: Never Used  Substance Use Topics  . Alcohol use: Yes    Comment: ocasionally   . Drug use: No    Review of Systems  Constitutional: Negative for fever. Eyes: Negative for visual changes. ENT: Negative for sore throat. Neck: No neck pain  Cardiovascular: Negative for chest pain. Respiratory: Negative for shortness of breath. Gastrointestinal: + diffuse cramping abdominal pain, nausea, and diarrhea. No vomiting Genitourinary: Negative for dysuria. Musculoskeletal: Negative for back  pain. Skin: Negative for rash. Neurological: Negative for headaches, weakness or numbness. Psych: No SI or HI  ____________________________________________   PHYSICAL EXAM:  VITAL SIGNS: ED Triage Vitals  Enc Vitals Group     BP 01/04/18 1352 (!) 145/89     Pulse Rate 01/04/18 1352 80     Resp 01/04/18 1352 19     Temp 01/04/18 1352 98.4 F (36.9 C)     Temp Source 01/04/18 1352 Oral     SpO2 01/04/18 1352 99 %     Weight 01/04/18 1354 235 lb (106.6 kg)     Height 01/04/18 1354 5\' 11"  (1.803 m)     Head Circumference --      Peak Flow --      Pain Score 01/04/18 1353 5     Pain Loc --      Pain Edu? --      Excl. in GC? --     Constitutional: Alert and oriented. Well appearing and in no apparent distress. HEENT:      Head: Normocephalic and atraumatic.         Eyes: Conjunctivae are normal. Sclera is non-icteric.       Mouth/Throat: Mucous membranes are moist.       Neck: Supple with no signs of meningismus. Cardiovascular: Regular rate and rhythm. No murmurs, gallops, or rubs. 2+ symmetrical distal pulses are present in all extremities. No JVD. Respiratory: Normal respiratory effort. Lungs are clear to auscultation bilaterally. No wheezes, crackles, or rhonchi.  Gastrointestinal: Obese, diffuse mild tenderness palpation, fully reducible umbilical hernia, and non distended with positive bowel sounds. No rebound or guarding. Musculoskeletal: Nontender with normal range of motion in all extremities. No edema, cyanosis, or erythema of extremities. Neurologic: Normal speech and language. Face is symmetric. Moving all extremities. No gross focal neurologic deficits are appreciated. Skin: Skin is warm, dry and intact. No rash noted. Psychiatric: Mood and affect are normal. Speech and behavior are normal.  ____________________________________________   LABS (all labs ordered are listed, but only abnormal results are displayed)  Labs Reviewed  COMPREHENSIVE METABOLIC PANEL -  Abnormal; Notable for the following components:      Result Value   Glucose, Bld 156 (*)    ALT 47 (*)    All other components within normal limits  CBC - Abnormal; Notable for the following components:   MCHC 37.1 (*)    All other components within normal limits  URINALYSIS, COMPLETE (UACMP) WITH MICROSCOPIC - Abnormal; Notable for the following components:   Color, Urine YELLOW (*)    APPearance CLEAR (*)    All other components within normal limits  LIPASE, BLOOD   ____________________________________________  EKG  none ____________________________________________  RADIOLOGY  I have personally reviewed the images performed during this visit and I agree with the Radiologist's read.   Interpretation by Radiologist:  Dg Abd 2 Views  Result Date: 01/04/2018 CLINICAL DATA:  Left-sided abdominal pain. EXAM: ABDOMEN - 2 VIEW COMPARISON:  None. FINDINGS: Bowel gas pattern is normal without  evidence of ileus, obstruction or free air. No abnormal calcifications or significant bone findings. IMPRESSION: Normal abdominal radiographs. Electronically Signed   By: Paulina FusiMark  Shogry M.D.   On: 01/04/2018 17:49      ____________________________________________   PROCEDURES  Procedure(s) performed: None Procedures Critical Care performed:  None ____________________________________________   INITIAL IMPRESSION / ASSESSMENT AND PLAN / ED COURSE  52 y.o. male with a history of diabetes, hypertension, hyperlipidemia, SBO status post bowel resection who presents for evaluation of intermittent cramping diffuse abdominal pain and diarrhea for the last week.  Patient is extremely well-appearing, no distress, has normal vital signs, abdomen has mild diffuse tenderness with no localized signs, fully reducible umbilical hernia.  Labs including CBC, CMP and lipase are all within normal limits with no leukocytosis.  Presentation concerning for viral gastroenteritis.  I do not see a need to do CT scan  with normal vitals, mildly diffuse tenderness with no localized findings and normal labs.  Recommend close follow-up with primary care doctor in 2 days if symptoms are ongoing or return to the emergency room for localized abdominal pain or fever.  Will give IV fluids for hydration.  Clinical Course as of Jan 05 1824  Wynelle LinkSun Jan 04, 2018  1821 Patient tolerating PO, no further episodes of diarrhea in the emergency room.  KUB and labs are all within normal limits.  This is most likely an infectious diarrhea process.  Recommended follow-up with his primary care doctor in a few days if patient is not feeling better or return to the emergency room for new or worsening abdominal pain, fever, melena, or vomiting.   [CV]    Clinical Course User Index [CV] Don PerkingVeronese, [place], MD     As part of my medical decision making, I reviewed the following data within the electronic MEDICAL RECORD NUMBER Nursing notes reviewed and incorporated, Labs reviewed , Old chart reviewed, Radiograph reviewed , Notes from prior ED visits and [place] Controlled Substance Database    Pertinent labs & imaging results that were available during my care of the patient were reviewed by me and considered in my medical decision making (see chart for details).    ____________________________________________   FINAL CLINICAL IMPRESSION(S) / ED DIAGNOSES  Final diagnoses:  Abdominal pain  Diarrhea of presumed infectious origin      NEW MEDICATIONS STARTED DURING THIS VISIT:  ED Discharge Orders    None       Note:  This document was prepared using Dragon voice recognition software and may include unintentional dictation errors.    Don PerkingVeronese, [place], MD 01/04/18 [phone]

## 2018-06-20 ENCOUNTER — Emergency Department
Admission: EM | Admit: 2018-06-20 | Discharge: 2018-06-20 | Disposition: A | Discharge status: Home / Self Care | Payer: BLUE CROSS/BLUE SHIELD | Attending: Emergency Medicine | Admitting: Emergency Medicine

## 2018-06-20 ENCOUNTER — Encounter: Payer: Self-pay | Admitting: Emergency Medicine

## 2018-06-20 ENCOUNTER — Other Ambulatory Visit: Payer: Self-pay

## 2018-06-20 DIAGNOSIS — I10 Essential (primary) hypertension: Secondary | ICD-10-CM | POA: Insufficient documentation

## 2018-06-20 DIAGNOSIS — F1910 Other psychoactive substance abuse, uncomplicated: Secondary | ICD-10-CM | POA: Diagnosis not present

## 2018-06-20 DIAGNOSIS — Z7984 Long term (current) use of oral hypoglycemic drugs: Secondary | ICD-10-CM | POA: Diagnosis not present

## 2018-06-20 DIAGNOSIS — F1210 Cannabis abuse, uncomplicated: Secondary | ICD-10-CM | POA: Insufficient documentation

## 2018-06-20 DIAGNOSIS — E119 Type 2 diabetes mellitus without complications: Secondary | ICD-10-CM | POA: Insufficient documentation

## 2018-06-20 DIAGNOSIS — Z79899 Other long term (current) drug therapy: Secondary | ICD-10-CM | POA: Insufficient documentation

## 2018-06-20 DIAGNOSIS — F1410 Cocaine abuse, uncomplicated: Secondary | ICD-10-CM | POA: Diagnosis not present

## 2018-06-20 DIAGNOSIS — F17290 Nicotine dependence, other tobacco product, uncomplicated: Secondary | ICD-10-CM | POA: Diagnosis not present

## 2018-06-20 DIAGNOSIS — F329 Major depressive disorder, single episode, unspecified: Secondary | ICD-10-CM | POA: Diagnosis not present

## 2018-06-20 LAB — CBC
HCT: 46.8 % (ref 39.0–52.0)
HEMOGLOBIN: 16.8 g/dL (ref 13.0–17.0)
MCH: 31.1 pg (ref 26.0–34.0)
MCHC: 35.9 g/dL (ref 30.0–36.0)
MCV: 86.5 fL (ref 80.0–100.0)
Platelets: 183 10*3/uL (ref 150–400)
RBC: 5.41 MIL/uL (ref 4.22–5.81)
RDW: 12.3 % (ref 11.5–15.5)
WBC: 10.6 10*3/uL — ABNORMAL HIGH (ref 4.0–10.5)
nRBC: 0 % (ref 0.0–0.2)

## 2018-06-20 LAB — COMPREHENSIVE METABOLIC PANEL
ALK PHOS: 47 U/L (ref 38–126)
ALT: 61 U/L — AB (ref 0–44)
AST: 50 U/L — AB (ref 15–41)
Albumin: 4.7 g/dL (ref 3.5–5.0)
Anion gap: 9 (ref 5–15)
BUN: 20 mg/dL (ref 6–20)
CO2: 25 mmol/L (ref 22–32)
CREATININE: 0.89 mg/dL (ref 0.61–1.24)
Calcium: 9.2 mg/dL (ref 8.9–10.3)
Chloride: 103 mmol/L (ref 98–111)
GFR calc non Af Amer: >60 mL/min (ref >60)
GLUCOSE: 250 mg/dL — AB (ref 70–99)
Potassium: 3.5 mmol/L (ref 3.5–5.1)
SODIUM: 137 mmol/L (ref 135–145)
Total Bilirubin: 1 mg/dL (ref 0.3–1.2)
Total Protein: 7.4 g/dL (ref 6.5–8.1)

## 2018-06-20 LAB — URINE DRUG SCREEN, QUALITATIVE (ARMC ONLY)
Amphetamines, Ur Screen: NOT DETECTED
Barbiturates, Ur Screen: NOT DETECTED
Benzodiazepine, Ur Scrn: NOT DETECTED
CANNABINOID 50 NG, UR ~~LOC~~: NOT DETECTED
COCAINE METABOLITE, UR ~~LOC~~: POSITIVE — AB
MDMA (Ecstasy)Ur Screen: NOT DETECTED
Methadone Scn, Ur: NOT DETECTED
OPIATE, UR SCREEN: NOT DETECTED
PHENCYCLIDINE (PCP) UR S: NOT DETECTED
Tricyclic, Ur Screen: NOT DETECTED

## 2018-06-20 LAB — ACETAMINOPHEN LEVEL: Acetaminophen (Tylenol), Serum: <10 ug/mL — ABNORMAL LOW (ref 10–30)

## 2018-06-20 LAB — GLUCOSE, CAPILLARY: Glucose-Capillary: 351 mg/dL — ABNORMAL HIGH (ref 70–99)

## 2018-06-20 LAB — ETHANOL

## 2018-06-20 MED ORDER — INSULIN ASPART 100 UNIT/ML ~~LOC~~ SOLN
8.0000 [IU] | Freq: Once | SUBCUTANEOUS | Status: AC
  Administered 2018-06-20: 8 [IU] via SUBCUTANEOUS
  Filled 2018-06-20: qty 1

## 2018-06-20 MED ORDER — GLIPIZIDE ER 10 MG PO TB24: 10.0000 mg | ORAL_TABLET | Freq: Every day | ORAL | 0 refills | Status: DC

## 2018-06-20 MED ORDER — LOSARTAN POTASSIUM 25 MG PO TABS: 25.0000 mg | ORAL_TABLET | Freq: Every day | ORAL | 0 refills | Status: DC

## 2018-06-20 MED ORDER — GLIPIZIDE ER 10 MG PO TB24: 10.0000 mg | ORAL_TABLET | Freq: Every day | ORAL | Status: DC

## 2018-06-20 NOTE — Consult Note (Signed)
[facility] Face-to-Face Psychiatry Consult   Reason for Consult:  Dr. Phineas Real  Referring Physician:  Dr. Reita May Patient Identification: Timothy Cameron MRN:  [id number] Principal Diagnosis: <principal problem not specified> Diagnosis:  Cocaine use disorder, severe Cannabis use disorder, severe Tobacco use disorder R/o unspecified depression  Total Time spent with patient:  61 minutes  HPI:   Patient is a 53 year old male with longitudinal history of cocaine use that started in his 62s.  He has no known psychiatric diagnosis and is never been admitted to the gastric facility in the past.  He presented to the emergency department with his girlfriend due to feelings of hopelessness regarding his cocaine use.  Patient says that he blew his tax refund of $1200 on crack cocaine.  Says that if he does not stop, he will lose his material possessions as well as his family and friends.  Patient started using cocaine 5 years ago after an 8-year period of sobriety, he uses for most days of the week.  Tells me that it may help him feel "good."  Denies having any of the perceived benefits on the medications.  Patient does drink a few beers and uses cannabis on a nightly basis in order to sleep.  He is never been on sleep medications.  In the intervening days of when he does not use drugs, he indicates that he is feeling fine.  Says that coming off of crack makes him feel depressed and has "emotional issues."  He had thoughts of suicide with a plan to drive his car into a tree but denies current suicidal plan, drive, intent or preparation.  Denies homicidal ideations.  No history of violence or anger.  Says that he has trouble sleeping for most days of the week.  No irritability or symptoms of mania.  Stressors include his job and having relational problems with his girlfriend, mostly about his drug use.  States that he direct deposits his paychecks into his girlfriend's accounts we did place the pills but has other avenues  to excess cash.  Denies symptoms of PTSD OCD.  No symptoms of anxiety.  Patient does not have a history of psychiatric issues but never been on any psychiatric medication.  He has been to compliance to treatment twice 30 years ago once at [facility] and then another in [facility].  No history of suicide attempts.  Past Psychiatric History: Patient does not have a history of psychiatric issues but never been on any psychiatric medication.  He has been to compliance to treatment twice 30 years ago once at [facility] and then another in [facility] /.  No history of suicide attempts.  Past Medical History:  Past Medical History:  Diagnosis Date  . Diabetes (HCC)   . High cholesterol   . Hypertension     Past Surgical History:  Procedure Laterality Date  . ABDOMINAL SURGERY    . COLONOSCOPY N/A 03/09/2016   Procedure: COLONOSCOPY;  Surgeon: Charlott Rakes, MD;  Location: [facility] ENDOSCOPY;  Service: Endoscopy;  Laterality: N/A;   Family History: History reviewed. No pertinent family history. Family Psychiatric  History: None Social History:  Social History   Substance and Sexual Activity  Alcohol Use Yes     Social History   Substance and Sexual Activity  Drug Use Yes  . Types: Cocaine, Marijuana    Social History   Socioeconomic History  . Marital status: Single    Spouse name: Not on  file  . Number of children: Not on file  . Years of education: Not on file  . Highest education level: Not on file  Occupational History  . Not on file  Social Needs  . Financial resource strain: Not on file  . Food insecurity:    Worry: Not on file    Inability: Not on file  . Transportation needs:    Medical: Not on file    Non-medical: Not on file  Tobacco Use  . Smoking status: Current Every Day Smoker    Types: Cigars    Last attempt to quit: 10/23/2017    Years since quitting: 0.6  . Smokeless tobacco: Never Used  Substance  and Sexual Activity  . Alcohol use: Yes  . Drug use: Yes    Types: Cocaine, Marijuana  . Sexual activity: Not on file  Lifestyle  . Physical activity:    Days per week: Not on file    Minutes per session: Not on file  . Stress: Not on file  Relationships  . Social connections:    Talks on phone: Not on file    Gets together: Not on file    Attends religious service: Not on file    Active member of club or organization: Not on file    Attends meetings of clubs or organizations: Not on file    Relationship status: Not on file  Other Topics Concern  . Not on file  Social History Narrative  . Not on file   Additional Social History:    Allergies:  No Known Allergies  Labs: No results found for this or any previous visit (from the past 48 hour(s)).  No current facility-administered medications for this encounter.    Current Outpatient Medications  Medication Sig Dispense Refill  . albuterol (PROVENTIL) (2.5 MG/3ML) 0.083% nebulizer solution Take 3 mLs (2.5 mg total) by nebulization every 6 (six) hours as needed for wheezing or shortness of breath. 75 mL 12  . atorvastatin (LIPITOR) 20 MG tablet Take 20 mg by mouth at bedtime.    Marland Kitchen glipiZIDE (GLUCOTROL XL) 10 MG 24 hr tablet Take 10 mg by mouth at bedtime.    Marland Kitchen HYDROcodone-acetaminophen (NORCO/VICODIN) 5-325 MG tablet Take 1 tablet by mouth every 6 (six) hours as needed for moderate pain. 15 tablet 0  . ketorolac (TORADOL) 10 MG tablet Take 1 tablet (10 mg total) by mouth every 6 (six) hours as needed for moderate pain. 12 tablet 0  . losartan (COZAAR) 25 MG tablet Take 25 mg by mouth daily.    . methocarbamol (ROBAXIN) 500 MG tablet Take 1 tablet (500 mg total) by mouth 4 (four) times daily. 20 tablet 0  . predniSONE (DELTASONE) 10 MG tablet Take 6 tablets on day 1, take 5 tablets on day 2, take 4 tablets on day 3, take 3 tablets on day 4, take 2 tablets on day 5, take 1 tablet on day 6 21 tablet 0  . psyllium (METAMUCIL) 58.6 %  packet Take 1 packet by mouth at bedtime.      Musculoskeletal: Strength & Muscle Tone: within normal limits Gait & Station: normal Patient leans: N/A  Psychiatric Specialty Exam: Physical Exam  ROS  Blood pressure (!) 146/94, pulse (!) 108, temperature 97.7 F (36.5 C), temperature source Oral, resp. rate 20, weight 106 kg, SpO2 99 %.Body mass index is 32.59 kg/m.  General Appearance: Disheveled  Eye Contact:  Poor  Speech:  Normal Rate  Volume:  Normal  Mood:  Hopeless  Affect:  Constricted  Thought Process:  Coherent  Orientation:  Full (Time, Place, and Person)  Thought Content:  Logical  Suicidal Thoughts:  No  Homicidal Thoughts:  No  Memory:  NA  Judgement:  Good  Insight:  Good  Psychomotor Activity:  Normal  Concentration:  Concentration: Good  Recall:  Good  Fund of Knowledge:  Good  Language:  Good  Akathisia:  No  Handed:  Right  AIMS (if indicated):     Assets:  Communication Skills  ADL's:  Intact  Cognition:  WNL  Sleep:        Treatment Plan Summary: Daily contact with patient to assess and evaluate symptoms and progress in treatment  Disposition: No evidence of imminent risk to self or others at present.    Reggie Pile, MD 06/20/2018 11:12 AM

## 2018-06-20 NOTE — ED Notes (Signed)
Calvin with TTS called and reported that pt had to have a CBG below 200 for [facility] to accept him - CBG 351 at this time - Dr Lenard Lance notified - new orders received then recheck CBG in one hour Calvin with TTS notified and reports that he will come and talk to family

## 2018-06-20 NOTE — ED Provider Notes (Signed)
[facility] Emergency Department Provider Note   ____________________________________________    I have reviewed the triage vital signs and the nursing notes.   HISTORY  Chief Complaint Drug Problem     HPI Timothy Cameron is a 53 y.o. male who presents for help with drug rehabilitation.  Patient reports that he has a long history with crack cocaine, over the last 2 days he has binged on crack and spent over $1200.  He reports this has him significantly depressed and he wants help.  No chest pain, no nausea or vomiting.  Denies other drug use  Past Medical History:  Diagnosis Date  . Diabetes (HCC)   . High cholesterol   . Hypertension     Patient Active Problem List   Diagnosis Date Noted  . Acute posthemorrhagic anemia 03/09/2016  . Thrombocytopenia (HCC) 03/09/2016  . Diabetes (HCC) 03/09/2016  . Tobacco abuse counseling 03/09/2016  . GIB (gastrointestinal bleeding) 03/08/2016  . Lower GI bleed     Past Surgical History:  Procedure Laterality Date  . ABDOMINAL SURGERY    . COLONOSCOPY N/A 03/09/2016   Procedure: COLONOSCOPY;  Surgeon: Charlott Rakes, MD;  Location: [facility] ENDOSCOPY;  Service: Endoscopy;  Laterality: N/A;    Prior to Admission medications   Medication Sig Start Date End Date Taking? Authorizing Provider  albuterol (PROVENTIL) (2.5 MG/3ML) 0.083% nebulizer solution Take 3 mLs (2.5 mg total) by nebulization every 6 (six) hours as needed for wheezing or shortness of breath. 11/09/17   Enid Derry, PA-C  atorvastatin (LIPITOR) 20 MG tablet Take 20 mg by mouth at bedtime.    [provider]  glipiZIDE (GLUCOTROL XL) 10 MG 24 hr tablet Take 10 mg by mouth at bedtime.    [provider]  HYDROcodone-acetaminophen (NORCO/VICODIN) 5-325 MG tablet Take 1 tablet by mouth every 6 (six) hours as needed for moderate pain. 09/28/17   Tommi Rumps, PA-C  ketorolac (TORADOL) 10 MG tablet Take 1 tablet (10 mg  total) by mouth every 6 (six) hours as needed for moderate pain. 09/28/17   Tommi Rumps, PA-C  losartan (COZAAR) 25 MG tablet Take 25 mg by mouth daily.    [provider]  methocarbamol (ROBAXIN) 500 MG tablet Take 1 tablet (500 mg total) by mouth 4 (four) times daily. 09/28/17   Tommi Rumps, PA-C  predniSONE (DELTASONE) 10 MG tablet Take 6 tablets on day 1, take 5 tablets on day 2, take 4 tablets on day 3, take 3 tablets on day 4, take 2 tablets on day 5, take 1 tablet on day 6 11/09/17   Enid Derry, PA-C  psyllium (METAMUCIL) 58.6 % packet Take 1 packet by mouth at bedtime.    [provider]     Allergies Patient has no known allergies.  History reviewed. No pertinent family history.  Social History Social History   Tobacco Use  . Smoking status: Current Every Day Smoker    Types: Cigars    Last attempt to quit: 10/23/2017    Years since quitting: 0.6  . Smokeless tobacco: Never Used  Substance Use Topics  . Alcohol use: Yes  . Drug use: Yes    Types: Cocaine, Marijuana    Review of Systems  Constitutional: No fever/chills Eyes: No visual changes.  ENT: No sore throat. Cardiovascular: Denies chest pain. Respiratory: Denies shortness of breath. Gastrointestinal: No abdominal pain.  Genitourinary: Negative for dysuria. Musculoskeletal: Negative for back pain. Skin: Negative for rash.  Neurological: Negative for headaches or weakness   ____________________________________________   PHYSICAL EXAM:  VITAL SIGNS: ED Triage Vitals  Enc Vitals Group     BP 06/20/18 0956 (!) 146/94     Pulse Rate 06/20/18 0956 (!) 108     Resp 06/20/18 0956 20     Temp 06/20/18 0956 97.7 F (36.5 C)     Temp Source 06/20/18 0956 Oral     SpO2 06/20/18 0956 99 %     Weight 06/20/18 0954 106 kg (233 lb 11 oz)     Height --      Head Circumference --      Peak Flow --      Pain Score 06/20/18 0954 0     Pain Loc --      Pain Edu? --      Excl. in GC? --      Constitutional: Alert and oriented. No acute distress. Pleasant and interactive  Nose: No congestion/rhinnorhea. Mouth/Throat: Mucous membranes are moist.    Cardiovascular: Mild tachycardia, regular rhythm. Grossly normal heart sounds.  Good peripheral circulation. Respiratory: Normal respiratory effort.  No retractions. Lungs CTAB. Gastrointestinal: Soft and nontender. No distention.  .  Musculoskeletal:   Warm and well perfused Neurologic:  Normal speech and language. No gross focal neurologic deficits are appreciated.  Skin:  Skin is warm, dry and intact. No rash noted. Psychiatric: Mood and affect are normal. Speech and behavior are normal.  ____________________________________________   LABS (all labs ordered are listed, but only abnormal results are displayed)  Labs Reviewed  CBC - Abnormal; Notable for the following components:      Result Value   WBC 10.6 (*)    All other components within normal limits  COMPREHENSIVE METABOLIC PANEL - Abnormal; Notable for the following components:   Glucose, Bld 250 (*)    AST 50 (*)    ALT 61 (*)    All other components within normal limits  URINE DRUG SCREEN, QUALITATIVE ([facility] ONLY) - Abnormal; Notable for the following components:   Cocaine Metabolite,Ur  POSITIVE (*)    All other components within normal limits  ETHANOL  ACETAMINOPHEN LEVEL   ____________________________________________  EKG  None ____________________________________________  RADIOLOGY  None ____________________________________________   PROCEDURES  Procedure(s) performed: No  Procedures   Critical Care performed: No ____________________________________________   INITIAL IMPRESSION / ASSESSMENT AND PLAN / ED COURSE  Pertinent labs & imaging results that were available during my care of the patient were reviewed by me and considered in my medical decision making (see chart for details).  Patient overall well-appearing and in no  acute distress, will consult TTS and psychiatry for evaluation possible placement for drug rehabilitation, pending labs  Lab work is reassuring, patient is medically cleared.  TTS is working on rehab facility    ____________________________________________   FINAL CLINICAL IMPRESSION(S) / ED DIAGNOSES  Final diagnoses:  Drug abuse [facility])        Note:  This document was prepared using Dragon voice recognition software and may include unintentional dictation errors.   Jene Every, MD 06/20/18 1316

## 2018-06-20 NOTE — ED Notes (Addendum)
Pt was informed by Jerilynn Som TTS that [facility] would not accept pt without a large copay - pt states he does not have the money and per TTS pt is being given resources to f/u with outpatient - TTS reports that from their perspective pt is clear for discharge - Dr Lenard Lance notified

## 2018-06-20 NOTE — ED Triage Notes (Addendum)
Pt to ed with wanting help to get off of crack cocaine.  Last in rehab 20 years ago.  Last use cocaine was at 11 pm last night.  States he used $1200 in drugs in the last 2 days.  Pt denies si, denies hi.  Denies hallucinations.  Simply states, "I do not want to do drugs anymore, I have done it all my life, I am tired of it now"

## 2018-06-20 NOTE — BH Assessment (Signed)
Assessment Note  Timothy Cameron is an 53 y.o. male who presents to the ER due to having thoughts of ending his life because of the increase drug use. Patient currently have no plans or gestures. He currently abuses cocaine, cannabis and alcohol. The cocaine is becoming more of a problem. He states, it is interfering with his relationship with his girlfriend and other areas of his life. He has used for approximately thirty years and the longest he's been sober is eight years.   During the interview, the patient was calm, cooperative and pleasant. He was able to provide appropriate answers to the questions. He acknowledges he has a problem and want help. He states, he owns two homes, a boat and several other things "but if he don't get help I'm going to lose it all." He was accompanied to the ER by his girlfriend. He states she is unaware of how much he is using and he is ashamed because of it. "She's a good woman and I've put her through a lot." She was in charge of his paychecks but due to recent drug use, she stopped and he is now spending all his money on drugs.  Several times during the interview, he became tearful and stated he was more emotional for the last thirty days. His sleep has decreased. He depends on cannabis and alcohol to help with sleep.   Diagnosis: Substance Use Disorder  Past Medical History:  Past Medical History:  Diagnosis Date  . Diabetes (HCC)   . High cholesterol   . Hypertension     Past Surgical History:  Procedure Laterality Date  . ABDOMINAL SURGERY    . COLONOSCOPY N/A 03/09/2016   Procedure: COLONOSCOPY;  Surgeon: Charlott Rakes, MD;  Location: [facility] ENDOSCOPY;  Service: Endoscopy;  Laterality: N/A;    Family History: History reviewed. No pertinent family history.  Social History:  reports that he has been smoking cigars. He has never used smokeless tobacco. He reports current alcohol use. He reports current drug use. Drugs: Cocaine and  Marijuana.  Additional Social History:  Alcohol / Drug Use Pain Medications: See PTA Prescriptions: See PTA Over the Counter: See PTA History of alcohol / drug use?: Yes Longest period of sobriety (when/how long): Eight Years Negative Consequences of Use: Financial, Personal relationships, Work / School Substance #1 Name of Substance 1: Cocaine 1 - Age of First Use: 20 1 - Amount (size/oz): $20-$40 1 - Frequency: Two to three times a week 1 - Duration: "Too long" 1 - Last Use / Amount: 06/19/2018 Substance #2 Name of Substance 2: Cannabis 2 - Age of First Use: 16 2 - Amount (size/oz): Unable to quantify 2 - Frequency: Daily 2 - Duration: "For years" 2 - Last Use / Amount: 06/19/2018 Substance #3 Name of Substance 3: Alcohol 3 - Age of First Use: Unable to quantify 3 - Amount (size/oz): "Two to three beers" 3 - Frequency: Daily 3 - Duration: "For a long time 3 - Last Use / Amount: 06/19/2018  CIWA: CIWA-Ar BP: (!) 146/94 Pulse Rate: (!) 108 COWS:    Allergies: No Known Allergies  Home Medications: (Not in a hospital admission)   OB/GYN Status:  No LMP for male patient.  General Assessment Data Location of Assessment: [facility] ED TTS Assessment: In system Is this a Tele or Face-to-Face Assessment?: Face-to-Face Is this an Initial Assessment or a Re-assessment for this encounter?: Initial Assessment Patient Accompanied by:: Other(Girlfriend) Language Other than English: No Living Arrangements: Other (Comment)(Private Home)  What gender do you identify as?: Male Marital status: Separated Pregnancy Status: No Living Arrangements: Spouse/significant other Can pt return to current living arrangement?: Yes Admission Status: Voluntary Is patient capable of signing voluntary admission?: No Referral Source: Self/Family/Friend Insurance type: BCBS  Medical Screening Exam [facility] Walk-in ONLY) Medical Exam completed: Yes  Crisis Care Plan Living Arrangements:  Spouse/significant other Legal Guardian: Other:(Self) Name of Psychiatrist: Reports of none Name of Therapist: Reports of none  Education Status Is patient currently in school?: No Is the patient employed, unemployed or receiving disability?: Employed  Risk to self with the past 6 months Suicidal Ideation: Yes-Currently Present Has patient been a risk to self within the past 6 months prior to admission? : No Suicidal Intent: No Has patient had any suicidal intent within the past 6 months prior to admission? : No Is patient at risk for suicide?: No Suicidal Plan?: No Has patient had any suicidal plan within the past 6 months prior to admission? : No Access to Means: No What has been your use of drugs/alcohol within the last 12 months?: Cocaine, Cannabis & Alcohol Previous Attempts/Gestures: Yes How many times?: 1 Other Self Harm Risks: Active drug use Triggers for Past Attempts: Other (Comment)(Drug use) Intentional Self Injurious Behavior: None Family Suicide History: Unknown Recent stressful life event(s): Other (Comment), Financial Problems, Conflict (Comment), Loss (Comment)(Drug use) Persecutory voices/beliefs?: No Depression: Yes Depression Symptoms: Insomnia, Tearfulness, Guilt, Loss of interest in usual pleasures, Feeling worthless/self pity, Fatigue, Isolating Substance abuse history and/or treatment for substance abuse?: Yes Suicide prevention information given to non-admitted patients: Not applicable  Risk to Others within the past 6 months Homicidal Ideation: No Does patient have any lifetime risk of violence toward others beyond the six months prior to admission? : No Thoughts of Harm to Others: No Current Homicidal Intent: No Current Homicidal Plan: No Access to Homicidal Means: No Identified Victim: Reports of none History of harm to others?: No Assessment of Violence: None Noted Violent Behavior Description: Reports of none Does patient have access to  weapons?: No Criminal Charges Pending?: No Does patient have a court date: No Is patient on probation?: No  Psychosis Hallucinations: None noted Delusions: None noted  Mental Status Report Appearance/Hygiene: Unremarkable, In scrubs Eye Contact: Fair Motor Activity: Freedom of movement, Unremarkable Speech: Logical/coherent, Unremarkable Level of Consciousness: Alert Mood: Depressed, Anxious, Helpless, Guilty, Sad, Pleasant Affect: Appropriate to circumstance, Depressed, Sad Anxiety Level: Minimal Thought Processes: Coherent, Relevant Judgement: Unimpaired Orientation: Person, Place, Time, Situation, Appropriate for developmental age Obsessive Compulsive Thoughts/Behaviors: Minimal  Cognitive Functioning Concentration: Normal Memory: Recent Intact, Remote Intact Is patient IDD: No Insight: Fair Impulse Control: Fair Appetite: Fair Have you had any weight changes? : No Change Sleep: Decreased Total Hours of Sleep: 6 Vegetative Symptoms: None  ADLScreening [facility] Assessment Services) Patient's cognitive ability adequate to safely complete daily activities?: Yes Patient able to express need for assistance with ADLs?: Yes Independently performs ADLs?: Yes (appropriate for developmental age)  Prior Inpatient Therapy Prior Inpatient Therapy: Yes Prior Therapy Dates: Unable to remember dates (Over 30 years ago) Prior Therapy Facilty/Provider(s): [facility] Reason for Treatment: Substance Use Disorder & Depression  Prior Outpatient Therapy Prior Outpatient Therapy: No Does patient have an ACCT team?: No Does patient have Intensive In-House Services?  : No Does patient have [facility] services? : No Does patient have P4CC services?: No  ADL Screening (condition at time of admission) Patient's cognitive ability adequate to safely complete daily activities?: Yes  Is the patient deaf or have difficulty hearing?: No Does the patient have difficulty  seeing, even when wearing glasses/contacts?: No Does the patient have difficulty concentrating, remembering, or making decisions?: No Patient able to express need for assistance with ADLs?: Yes Does the patient have difficulty dressing or bathing?: No Independently performs ADLs?: Yes (appropriate for developmental age) Does the patient have difficulty walking or climbing stairs?: No Weakness of Legs: None Weakness of Arms/Hands: None  Home Assistive Devices/Equipment Home Assistive Devices/Equipment: None  Therapy Consults (therapy consults require a physician order) PT Evaluation Needed: No OT Evalulation Needed: No SLP Evaluation Needed: No Abuse/Neglect Assessment (Assessment to be complete while patient is alone) Abuse/Neglect Assessment Can Be Completed: Yes Physical Abuse: Denies Verbal Abuse: Denies Sexual Abuse: Denies Exploitation of patient/patient's resources: Denies Self-Neglect: Denies Values / Beliefs Cultural Requests During Hospitalization: None Spiritual Requests During Hospitalization: None Consults Spiritual Care Consult Needed: No Social Work Consult Needed: No         Child/Adolescent Assessment Running Away Risk: Denies(Patient is an adult)  Disposition:  Disposition Initial Assessment Completed for this Encounter: Yes  On Site Evaluation by:   Reviewed with Physician:    Lilyan Gilford MS, LCAS, LPMHCA, NCC, CCSI Therapeutic Triage Specialist 06/20/2018 11:26 AM

## 2018-06-20 NOTE — ED Notes (Signed)
Awaiting glipizide from pharmacy

## 2018-06-20 NOTE — BH Assessment (Signed)
Patient was declined at [facility] and [facility].  Writer spoke with the patient and his girlfriend and provided him with outpatient instructions for SA and Mental Health treatment.  Patient denies SI/HI and AV/H.  Writer updated ER MD (Dr. Lenard Lance)

## 2018-06-20 NOTE — ED Notes (Signed)
Pt family went and bought him supper - he is eating at this time

## 2018-06-20 NOTE — BH Assessment (Signed)
Writer discussed with the patient about the option for detox/treatment with [facility]. Patient was in the agreement with the plan.  Patient signed the Release of information, in order to send labs and ER Notes.

## 2018-06-20 NOTE — ED Provider Notes (Signed)
-----------------------------------------   6:26 PM on 06/20/2018 -----------------------------------------  Patient has been accepted to [facility] for detox however they wish for the patient's blood glucose to be less than 200 prior to excepting the patient.  Patient's blood glucose initially was 250 however the patient has been eating and recheck is 351.  Has not taken any of his diabetic medications over the past 3 days.  We will dose 8 units of subcutaneous insulin, and dose the patient's extended release glipizide 10 mg which is the patient's baseline diabetic medication.  As long as the patient's blood sugar comes down I anticipate likely discharge to [facility] for further treatment.  [facility] is requiring substantial copayment by the patient prior to excepting him.  Patient does not have finances to pay for this.  Patient has been provided outpatient detox options.  This time the patient is safe for discharge from the emergency department from a medical and psychiatric standpoint.   Minna Antis, MD 06/20/18 [phone]

## 2018-06-20 NOTE — ED Notes (Signed)
Pt friend/girlfriend is in with patient and when she is present he denies suicidal ideation - when she is not in the room he is very tearful and begging for help stating that he tried to kill himself this past week by trying to "smoke enough crack to take me out" - pt reports using crack every other day and in the last 3 days he has spent $1350 on crack - he states that since that did not work that he is thinking of driving himself into a tree - Dr Cyril Loosen was made aware

## 2018-06-20 NOTE — ED Notes (Signed)
Pt informed that rehab facility was reviewing his chart and that he should know within the hour if they will accept him (per [facility])

## 2018-06-20 NOTE — BH Assessment (Signed)
[facility] ([phone])-Pending Review.  [facility] ([phone])-Pending Review.

## 2018-06-20 NOTE — ED Notes (Signed)
Pt left without discharge paperwork. Pt was advised that we were getting his paperwork together and pt decided that he could not wait anymore.

## 2018-06-20 NOTE — ED Notes (Signed)
Pt given supper tray and ate 100%

## 2018-11-20 ENCOUNTER — Emergency Department: Payer: BLUE CROSS/BLUE SHIELD

## 2018-11-20 ENCOUNTER — Encounter: Payer: Self-pay | Admitting: Intensive Care

## 2018-11-20 ENCOUNTER — Other Ambulatory Visit: Payer: Self-pay

## 2018-11-20 DIAGNOSIS — M5412 Radiculopathy, cervical region: Secondary | ICD-10-CM | POA: Diagnosis not present

## 2018-11-20 DIAGNOSIS — I10 Essential (primary) hypertension: Secondary | ICD-10-CM | POA: Diagnosis not present

## 2018-11-20 DIAGNOSIS — B349 Viral infection, unspecified: Secondary | ICD-10-CM | POA: Diagnosis not present

## 2018-11-20 DIAGNOSIS — Z20828 Contact with and (suspected) exposure to other viral communicable diseases: Secondary | ICD-10-CM | POA: Insufficient documentation

## 2018-11-20 DIAGNOSIS — E119 Type 2 diabetes mellitus without complications: Secondary | ICD-10-CM | POA: Diagnosis not present

## 2018-11-20 DIAGNOSIS — F17290 Nicotine dependence, other tobacco product, uncomplicated: Secondary | ICD-10-CM | POA: Insufficient documentation

## 2018-11-20 DIAGNOSIS — R05 Cough: Secondary | ICD-10-CM | POA: Diagnosis present

## 2018-11-20 LAB — CBC
HCT: 45.9 % (ref 39.0–52.0)
Hemoglobin: 16.3 g/dL (ref 13.0–17.0)
MCH: 31.5 pg (ref 26.0–34.0)
MCHC: 35.5 g/dL (ref 30.0–36.0)
MCV: 88.6 fL (ref 80.0–100.0)
Platelets: 159 10*3/uL (ref 150–400)
RBC: 5.18 MIL/uL (ref 4.22–5.81)
RDW: 12 % (ref 11.5–15.5)
WBC: 8 10*3/uL (ref 4.0–10.5)
nRBC: 0 % (ref 0.0–0.2)

## 2018-11-20 LAB — BASIC METABOLIC PANEL
Anion gap: 10 (ref 5–15)
BUN: 14 mg/dL (ref 6–20)
CO2: 24 mmol/L (ref 22–32)
Calcium: 9.1 mg/dL (ref 8.9–10.3)
Chloride: 104 mmol/L (ref 98–111)
Creatinine, Ser: 1.04 mg/dL (ref 0.61–1.24)
GFR calc Af Amer: >60 mL/min (ref >60)
GFR calc non Af Amer: >60 mL/min (ref >60)
Glucose, Bld: 209 mg/dL — ABNORMAL HIGH (ref 70–99)
Potassium: 3.7 mmol/L (ref 3.5–5.1)
Sodium: 138 mmol/L (ref 135–145)

## 2018-11-20 LAB — TROPONIN I (HIGH SENSITIVITY): Troponin I (High Sensitivity): 4 ng/L (ref <18)

## 2018-11-20 NOTE — ED Notes (Signed)
Patient given update on wait time. Patient verbalizes understanding.  

## 2018-11-20 NOTE — ED Triage Notes (Signed)
Patient c/o sore throat, cough, headache with pain down right side of face, tingling in right arm to finger tips. Speech clear. Ambulatory into triage with no problems

## 2018-11-21 ENCOUNTER — Emergency Department
Admission: EM | Admit: 2018-11-21 | Discharge: 2018-11-21 | Disposition: A | Discharge status: Home / Self Care | Payer: BLUE CROSS/BLUE SHIELD | Attending: Emergency Medicine | Admitting: Emergency Medicine

## 2018-11-21 DIAGNOSIS — M5412 Radiculopathy, cervical region: Secondary | ICD-10-CM

## 2018-11-21 DIAGNOSIS — J069 Acute upper respiratory infection, unspecified: Secondary | ICD-10-CM

## 2018-11-21 MED ORDER — PREDNISONE 20 MG PO TABS
40.0000 mg | ORAL_TABLET | Freq: Once | ORAL | Status: AC
  Administered 2018-11-21: 40 mg via ORAL
  Filled 2018-11-21: qty 2

## 2018-11-21 MED ORDER — PREDNISONE 50 MG PO TABS: ORAL_TABLET | ORAL | 0 refills | Status: DC

## 2018-11-21 MED ORDER — AZITHROMYCIN 500 MG PO TABS
500.0000 mg | ORAL_TABLET | Freq: Once | ORAL | Status: AC
  Administered 2018-11-21: 500 mg via ORAL
  Filled 2018-11-21: qty 1

## 2018-11-21 MED ORDER — PREDNISONE 20 MG PO TABS: 60.0000 mg | ORAL_TABLET | Freq: Once | ORAL | Status: DC

## 2018-11-21 MED ORDER — AZITHROMYCIN 250 MG PO TABS: ORAL_TABLET | ORAL | 0 refills | Status: DC

## 2018-11-21 NOTE — ED Notes (Signed)
No peripheral IV placed this visit.   Discharge instructions reviewed with patient. Questions fielded by this RN. Patient verbalizes understanding of instructions. Patient discharged home in stable condition per Marianne. No acute distress noted at time of discharge.

## 2018-11-21 NOTE — ED Notes (Signed)
ED Provider at bedside.  Pt reports 3-4 days of "chest congestion", "bad" cough, right hand tingling, right side of face sensitive and tingling, pt works in the [employer]

## 2018-11-21 NOTE — ED Provider Notes (Addendum)
[facility] Emergency Department Provider Note       Time seen: ----------------------------------------- 12:07 AM on 11/21/2018 -----------------------------------------   I have reviewed the triage vital signs and the nursing notes.  HISTORY   Chief Complaint Tingling, Headache, Sore Throat, and Cough    HPI Timothy Cameron is a 53 y.o. male with a history of diabetes, hyperlipidemia, hypertension who presents to the ED for sore throat, cough, headache with pain down the right side of his face, tingling in his right arm and fingertips.  Pain was 3 out of 10 in his head.  Patient has described a persistent cough, has radiating pain down his right arm at times.  Past Medical History:  Diagnosis Date  . Diabetes (HCC)   . High cholesterol   . Hypertension     Patient Active Problem List   Diagnosis Date Noted  . Acute posthemorrhagic anemia 03/09/2016  . Thrombocytopenia (HCC) 03/09/2016  . Diabetes (HCC) 03/09/2016  . Tobacco abuse counseling 03/09/2016  . GIB (gastrointestinal bleeding) 03/08/2016  . Lower GI bleed     Past Surgical History:  Procedure Laterality Date  . ABDOMINAL SURGERY    . COLONOSCOPY N/A 03/09/2016   Procedure: COLONOSCOPY;  Surgeon: Charlott RakesVincent Schooler, MD;  [facility];  Service: Endoscopy;  Laterality: N/A;    Allergies Patient has no known allergies.  Social History Social History   Tobacco Use  . Smoking status: Current Every Day Smoker    Types: Cigars    Last attempt to quit: 10/23/2017    Years since quitting: 1.0  . Smokeless tobacco: Never Used  Substance Use Topics  . Alcohol use: Yes    Alcohol/week: 30.0 standard drinks    Types: 30 Cans of beer per week  . Drug use: Yes    Types: Cocaine, Marijuana    Review of Systems Constitutional: Negative for fever. Cardiovascular: Negative for chest pain. Respiratory: Negative for shortness of breath.  Positive for cough Gastrointestinal:  Negative for abdominal pain, vomiting and diarrhea. Musculoskeletal: Negative for back pain. Skin: Negative for rash. Neurological: Positive for headache and paresthesias  All systems negative/normal/unremarkable except as stated in the HPI  ____________________________________________   PHYSICAL EXAM:  VITAL SIGNS: ED Triage Vitals  Enc Vitals Group     BP 11/20/18 1810 (!) 188/98     Pulse Rate 11/20/18 1810 84     Resp 11/20/18 1810 16     Temp 11/20/18 1810 98.4 F (36.9 C)     Temp Source 11/20/18 1810 Oral     SpO2 11/20/18 1810 99 %     Weight 11/20/18 1811 230 lb (104.3 kg)     Height 11/20/18 1811 5\' 11"  (1.803 m)     Head Circumference --      Peak Flow --      Pain Score 11/20/18 1810 3     Pain Loc --      Pain Edu? --      Excl. in GC? --    Constitutional: Alert and oriented. Well appearing and in no distress. Eyes: Conjunctivae are normal. Normal extraocular movements. ENT      Head: Normocephalic and atraumatic.      Nose: No congestion/rhinnorhea.      Mouth/Throat: Mucous membranes are moist.      Neck: No stridor. Cardiovascular: Normal rate, regular rhythm. No murmurs, rubs, or gallops. Respiratory: Normal respiratory effort without tachypnea nor retractions. Breath sounds are clear and equal bilaterally. No wheezes/rales/rhonchi. Gastrointestinal: Soft  and nontender. Normal bowel sounds Musculoskeletal: Nontender with normal range of motion in extremities. No lower extremity tenderness nor edema. Neurologic:  Normal speech and language. No gross focal neurologic deficits are appreciated.  Skin:  Skin is warm, dry and intact. No rash noted. Psychiatric: Mood and affect are normal. Speech and behavior are normal.  ____________________________________________  EKG: Interpreted by me.  Sinus rhythm with rate 87 bpm, normal PR interval, normal QRS, normal QT  ____________________________________________  ED COURSE:  As part of my medical decision  making, I reviewed the following data within the [facility] History obtained from family if available, nursing notes, old chart and ekg, as well as notes from prior ED visits. Patient presented for cough, headache and paresthesias, we will assess with labs and imaging as indicated at this time.   Procedures  KEIFER HABIB was evaluated in Emergency Department on 11/21/2018 for the symptoms described in the history of present illness. He was evaluated in the context of the global COVID-19 pandemic, which necessitated consideration that the patient might be at risk for infection with the SARS-CoV-2 virus that causes COVID-19. Institutional protocols and algorithms that pertain to the evaluation of patients at risk for COVID-19 are in a state of rapid change based on information released by regulatory bodies including the CDC and federal and state organizations. These policies and algorithms were followed during the patient's care in the ED.  ____________________________________________   LABS (pertinent positives/negatives)  Labs Reviewed  BASIC METABOLIC PANEL - Abnormal; Notable for the following components:      Result Value   Glucose, Bld 209 (*)    All other components within normal limits  CBC  TROPONIN I (HIGH SENSITIVITY)  TROPONIN I (HIGH SENSITIVITY)    RADIOLOGY  Chest x-ray Is unremarkable ____________________________________________   DIFFERENTIAL DIAGNOSIS   Musculoskeletal pain, GERD, anxiety, pneumonia, coronavirus, paresthesia  FINAL ASSESSMENT AND PLAN  Viral illness, cervical radiculopathy   Plan: The patient had presented for symptoms of sinus congestion, cough and tingling in his right arm. Patient's labs were unremarkable with exception of mild hyperglycemia. Patient's imaging did not reveal any acute process.  We will place him on antibiotics for his persistent cough and sinus congestion.  I will place him on a short course of steroids as well  for his cervical radiculopathy and sinus symptoms.  He is cleared for outpatient follow-up, we have sent a send-out test for COVID-19.   Laurence Aly, MD    Note: This note was generated in part or whole with voice recognition software. Voice recognition is usually quite accurate but there are transcription errors that can and very often do occur. I apologize for any typographical errors that were not detected and corrected.     Earleen Newport, MD 11/21/18 Greer Pickerel    Earleen Newport, MD 11/21/18 [phone]

## 2018-11-22 LAB — NOVEL CORONAVIRUS, NAA (HOSP ORDER, SEND-OUT TO REF LAB; TAT 18-24 HRS): SARS-CoV-2, NAA: NOT DETECTED

## 2018-11-23 ENCOUNTER — Other Ambulatory Visit: Payer: Self-pay

## 2018-11-23 ENCOUNTER — Emergency Department
Admission: EM | Admit: 2018-11-23 | Discharge: 2018-11-23 | Disposition: A | Discharge status: Home / Self Care | Payer: BLUE CROSS/BLUE SHIELD | Attending: Student | Admitting: Student

## 2018-11-23 ENCOUNTER — Encounter: Payer: Self-pay | Admitting: Emergency Medicine

## 2018-11-23 DIAGNOSIS — J029 Acute pharyngitis, unspecified: Secondary | ICD-10-CM | POA: Insufficient documentation

## 2018-11-23 DIAGNOSIS — F17210 Nicotine dependence, cigarettes, uncomplicated: Secondary | ICD-10-CM | POA: Insufficient documentation

## 2018-11-23 DIAGNOSIS — Z79899 Other long term (current) drug therapy: Secondary | ICD-10-CM | POA: Diagnosis not present

## 2018-11-23 DIAGNOSIS — R05 Cough: Secondary | ICD-10-CM | POA: Insufficient documentation

## 2018-11-23 DIAGNOSIS — I10 Essential (primary) hypertension: Secondary | ICD-10-CM | POA: Insufficient documentation

## 2018-11-23 DIAGNOSIS — E119 Type 2 diabetes mellitus without complications: Secondary | ICD-10-CM | POA: Diagnosis not present

## 2018-11-23 MED ORDER — BENZONATATE 100 MG PO CAPS: 100.0000 mg | ORAL_CAPSULE | Freq: Three times a day (TID) | ORAL | 0 refills | Status: AC | PRN

## 2018-11-23 MED ORDER — KETOROLAC TROMETHAMINE 60 MG/2ML IM SOLN
30.0000 mg | Freq: Once | INTRAMUSCULAR | Status: AC
  Administered 2018-11-23: 30 mg via INTRAMUSCULAR
  Filled 2018-11-23: qty 2

## 2018-11-23 MED ORDER — IBUPROFEN 800 MG PO TABS: 800.0000 mg | ORAL_TABLET | Freq: Three times a day (TID) | ORAL | 0 refills | Status: DC | PRN

## 2018-11-23 NOTE — ED Provider Notes (Signed)
[facility] Emergency Department Provider Note  ____________________________________________   First MD Initiated Contact with Patient 11/23/18 0801     (approximate)  I have reviewed the triage vital signs and the nursing notes.   HISTORY  Chief Complaint Sore Throat and Cough    HPI Timothy Cameron is a 53 y.o. male history of hypertension, high cholesterol, diabetes who presents for sore throat and cough.    Patient was seen here on 8/1, diagnosed with a viral upper respiratory infection, and started on azithromycin and prednisone.  Patient states he has been taking as directed.  He had a COVID swab at that visit, which was negative.  He is primarily bothered by nocturnal cough, as well as sore throat.  He states he is intermittently taking ibuprofen for sore throat, but otherwise is not taking any regular medications to help with his symptoms.  He is still able to stay hydrated, and continues to take p.o. liquids without issues.  He denies any fevers, no productive cough, no chest pain, no sinus pressure, no rashes.     Past Medical History:  Diagnosis Date  . Diabetes (HCC)   . High cholesterol   . Hypertension     Patient Active Problem List   Diagnosis Date Noted  . Acute posthemorrhagic anemia 03/09/2016  . Thrombocytopenia (HCC) 03/09/2016  . Diabetes (HCC) 03/09/2016  . Tobacco abuse counseling 03/09/2016  . GIB (gastrointestinal bleeding) 03/08/2016  . Lower GI bleed     Past Surgical History:  Procedure Laterality Date  . ABDOMINAL SURGERY    . COLONOSCOPY N/A 03/09/2016   Procedure: COLONOSCOPY;  Surgeon: Charlott RakesVincent Schooler, MD;  [facility];  Service: Endoscopy;  Laterality: N/A;    Prior to Admission medications   Medication Sig Start Date End Date Taking? Authorizing Provider  albuterol (PROVENTIL) (2.5 MG/3ML) 0.083% nebulizer solution Take 3 mLs (2.5 mg total) by nebulization every 6 (six) hours as needed  for wheezing or shortness of breath. 11/09/17   Enid DerryWagner, Ashley, PA-C  atorvastatin (LIPITOR) 20 MG tablet Take 20 mg by mouth at bedtime.    [provider]  azithromycin (ZITHROMAX Z-PAK) 250 MG tablet 1 tablet (250 mg) once daily on Days 2 through 5. 11/21/18   Emily FilbertWilliams, Jonathan E, MD  benzonatate (TESSALON PERLES) 100 MG capsule Take 1 capsule (100 mg total) by mouth 3 (three) times daily as needed for cough. 11/23/18 11/23/19  Miguel AschoffMonks,  L., MD  glipiZIDE (GLUCOTROL XL) 10 MG 24 hr tablet Take 1 tablet (10 mg total) by mouth at bedtime. 06/20/18   Minna AntisPaduchowski, Kevin, MD  HYDROcodone-acetaminophen (NORCO/VICODIN) 5-325 MG tablet Take 1 tablet by mouth every 6 (six) hours as needed for moderate pain. 09/28/17   Tommi RumpsSummers, Rhonda L, PA-C  ibuprofen (ADVIL) 800 MG tablet Take 1 tablet (800 mg total) by mouth every 8 (eight) hours as needed. 11/23/18   Miguel AschoffMonks,  L., MD  ketorolac (TORADOL) 10 MG tablet Take 1 tablet (10 mg total) by mouth every 6 (six) hours as needed for moderate pain. 09/28/17   Tommi RumpsSummers, Rhonda L, PA-C  losartan (COZAAR) 25 MG tablet Take 1 tablet (25 mg total) by mouth daily. 06/20/18   Minna AntisPaduchowski, Kevin, MD  methocarbamol (ROBAXIN) 500 MG tablet Take 1 tablet (500 mg total) by mouth 4 (four) times daily. 09/28/17   Tommi RumpsSummers, Rhonda L, PA-C  predniSONE (DELTASONE) 10 MG tablet Take 6 tablets on day 1, take 5 tablets on day 2, take 4 tablets on day  3, take 3 tablets on day 4, take 2 tablets on day 5, take 1 tablet on day 6 11/09/17   Enid DerryWagner, Ashley, PA-C  predniSONE (DELTASONE) 50 MG tablet Take 1 tablet by mouth daily 11/21/18   Emily FilbertWilliams, Jonathan E, MD  psyllium (METAMUCIL) 58.6 % packet Take 1 packet by mouth at bedtime.    [provider]    Allergies Patient has no known allergies.  No family history on file.  Social History Social History   Tobacco Use  . Smoking status: Current Every Day Smoker    Types: Cigars    Last attempt to quit: 10/23/2017    Years since  quitting: 1.0  . Smokeless tobacco: Never Used  Substance Use Topics  . Alcohol use: Yes    Alcohol/week: 30.0 standard drinks    Types: 30 Cans of beer per week  . Drug use: Yes    Types: Cocaine, Marijuana    Review of Systems  Constitutional: Denies fever or chills. Eyes: Denies visual changes. ENT: + sore throat. Cardiovascular: Denies chest pain. Respiratory: + cough Gastrointestinal: Denies abdominal pain, nausea, or vomiting. Genitourinary: Denies for dysuria. Musculoskeletal: Denies for back pain. Skin: Denies for rash. Neurological: Denies for headaches.  ____________________________________________   PHYSICAL EXAM:  VITAL SIGNS: ED Triage Vitals  Enc Vitals Group     BP 11/23/18 0750 (!) 156/91     Pulse Rate 11/23/18 0750 84     Resp 11/23/18 0750 18     Temp 11/23/18 0750 98.9 F (37.2 C)     Temp Source 11/23/18 0750 Oral     SpO2 11/23/18 0750 96 %     Weight 11/23/18 0748 230 lb (104.3 kg)     Height 11/23/18 0748 5\' 11"  (1.803 m)     Head Circumference --      Peak Flow --      Pain Score 11/23/18 0748 9     Pain Loc --      Pain Edu? --      Excl. in GC? --     Constitutional: Alert and oriented.  Eyes: Conjunctivae are normal. Sclera anicteric. Head: Normocephalic. Atraumatic.  TMs clear bilaterally.  No lesions. Nose: No congestion/rhinnorhea. Mouth/Throat: Mucous membranes are moist.  Oropharynx is mildly erythematous, no tonsillar hypertrophy or exudates.  No palatal lesions.  Uvula is midline. Neck: No stridor.  No voice changes. Cardiovascular: Normal rate, regular rhythm. Grossly normal heart sounds.  Good peripheral circulation. Respiratory: Normal respiratory effort.  No retractions. Lungs CTAB. Gastrointestinal: Soft and nontender. No distention.  Musculoskeletal: No lower extremity edema. Neurologic:  Normal speech and language. No gross focal neurologic deficits are appreciated.  Skin: Skin is warm, dry and intact. No rash noted.  Psychiatric: Mood and affect are appropriate for situation.  ______________________________   INITIAL IMPRESSION / ASSESSMENT AND PLAN / ED COURSE  53 year old male, recently diagnosed with a viral episode of upper respiratory infection, who presents for continued sore throat and cough since Saturday.  Seen here on 8/1, diagnosed with URI, and he has been taking his azithromycin and prednisone as prescribed.  He is primarily bothered by the sore throat and the cough, particularly at night.  On exam, his oropharynx is mildly erythematous, no tonsillar hypertrophy or exudates.  No voice changes.  No facial tenderness.  Normal TMs bilaterally.  No rashes.  Suspect likely mild continued symptoms from his viral URI.  No evidence of PTA, exam not consistent with sinusitis given no facial tenderness.  No vesicles or lesions suggestive of viral etiology or shingles.  We will plan for symptomatic treatment here with Toradol, and then plan for prescriptions for [facility] as well as ibuprofen.  Discussed this plan with the patient, who is agreeable.  Advise close PCP follow-up.  Given return precautions.  ____________________________________________   FINAL CLINICAL IMPRESSION(S) / ED DIAGNOSES  Final diagnoses:  Sore throat     ED Discharge Orders         Ordered    ibuprofen (ADVIL) 800 MG tablet  Every 8 hours PRN     11/23/18 0815    benzonatate (TESSALON PERLES) 100 MG capsule  3 times daily PRN     11/23/18 0815           Note:  This document was prepared using Dragon voice recognition software and may include unintentional dictation errors.   Lilia Pro., MD 11/23/18 [phone]

## 2018-11-23 NOTE — ED Notes (Signed)
NAD noted at time of D/C. Pt denies questions or concerns. Pt ambulatory to the lobby at this time.  

## 2018-11-23 NOTE — ED Triage Notes (Signed)
Pt reports sore throat and cough for a little over a week. Pt states started a zpack and prednisone on Friday and not any better.

## 2018-11-23 NOTE — Discharge Instructions (Signed)
Please continue to take your medications as prescribed.  Please use your new prescriptions as needed for further symptom control.  Please follow-up with your primary care doctor to review your ER visit.  Return to the emergency department for any new or worsening symptoms.

## 2018-11-23 NOTE — ED Notes (Signed)
Pt c/o continued symptoms since Friday. Pt states prescribed abx and steroids without relief. A&O x4, speaking in complete sentences without difficulty. Respirations even and unlabored. Will continue to monitor for further patient needs.

## 2021-02-07 ENCOUNTER — Ambulatory Visit
Admission: RE | Admit: 2021-02-07 | Discharge: 2021-02-07 | Disposition: A | Discharge status: Home / Self Care | Payer: 59 | Attending: Family Medicine | Admitting: Family Medicine

## 2021-02-07 ENCOUNTER — Other Ambulatory Visit: Payer: Self-pay | Admitting: Family Medicine

## 2021-02-07 ENCOUNTER — Other Ambulatory Visit: Payer: Self-pay

## 2021-02-07 ENCOUNTER — Ambulatory Visit
Admission: RE | Admit: 2021-02-07 | Discharge: 2021-02-07 | Disposition: A | Discharge status: Home / Self Care | Payer: 59 | Source: Ambulatory Visit | Attending: Family Medicine | Admitting: Family Medicine

## 2021-02-07 DIAGNOSIS — M7031 Other bursitis of elbow, right elbow: Secondary | ICD-10-CM | POA: Diagnosis not present

## 2021-04-25 ENCOUNTER — Emergency Department: Payer: 59

## 2021-04-25 ENCOUNTER — Other Ambulatory Visit: Payer: Self-pay

## 2021-04-25 DIAGNOSIS — K660 Peritoneal adhesions (postprocedural) (postinfection): Secondary | ICD-10-CM | POA: Diagnosis present

## 2021-04-25 DIAGNOSIS — E1169 Type 2 diabetes mellitus with other specified complication: Secondary | ICD-10-CM | POA: Diagnosis present

## 2021-04-25 DIAGNOSIS — T380X5A Adverse effect of glucocorticoids and synthetic analogues, initial encounter: Secondary | ICD-10-CM | POA: Diagnosis present

## 2021-04-25 DIAGNOSIS — Z7952 Long term (current) use of systemic steroids: Secondary | ICD-10-CM

## 2021-04-25 DIAGNOSIS — Z79899 Other long term (current) drug therapy: Secondary | ICD-10-CM

## 2021-04-25 DIAGNOSIS — B952 Enterococcus as the cause of diseases classified elsewhere: Secondary | ICD-10-CM | POA: Diagnosis present

## 2021-04-25 DIAGNOSIS — K5720 Diverticulitis of large intestine with perforation and abscess without bleeding: Principal | ICD-10-CM | POA: Diagnosis present

## 2021-04-25 DIAGNOSIS — E7800 Pure hypercholesterolemia, unspecified: Secondary | ICD-10-CM | POA: Diagnosis present

## 2021-04-25 DIAGNOSIS — J9601 Acute respiratory failure with hypoxia: Secondary | ICD-10-CM | POA: Diagnosis present

## 2021-04-25 DIAGNOSIS — U071 COVID-19: Secondary | ICD-10-CM | POA: Diagnosis present

## 2021-04-25 DIAGNOSIS — B9620 Unspecified Escherichia coli [E. coli] as the cause of diseases classified elsewhere: Secondary | ICD-10-CM | POA: Diagnosis present

## 2021-04-25 DIAGNOSIS — I10 Essential (primary) hypertension: Secondary | ICD-10-CM | POA: Diagnosis present

## 2021-04-25 DIAGNOSIS — K631 Perforation of intestine (nontraumatic): Secondary | ICD-10-CM | POA: Diagnosis not present

## 2021-04-25 DIAGNOSIS — R7881 Bacteremia: Secondary | ICD-10-CM | POA: Diagnosis present

## 2021-04-25 DIAGNOSIS — F17290 Nicotine dependence, other tobacco product, uncomplicated: Secondary | ICD-10-CM | POA: Diagnosis present

## 2021-04-25 DIAGNOSIS — J1282 Pneumonia due to coronavirus disease 2019: Secondary | ICD-10-CM | POA: Diagnosis present

## 2021-04-25 DIAGNOSIS — K650 Generalized (acute) peritonitis: Secondary | ICD-10-CM | POA: Diagnosis present

## 2021-04-25 DIAGNOSIS — K430 Incisional hernia with obstruction, without gangrene: Secondary | ICD-10-CM | POA: Diagnosis present

## 2021-04-25 DIAGNOSIS — K567 Ileus, unspecified: Secondary | ICD-10-CM | POA: Diagnosis present

## 2021-04-25 DIAGNOSIS — R109 Unspecified abdominal pain: Secondary | ICD-10-CM | POA: Diagnosis not present

## 2021-04-25 DIAGNOSIS — E1165 Type 2 diabetes mellitus with hyperglycemia: Secondary | ICD-10-CM | POA: Diagnosis present

## 2021-04-25 DIAGNOSIS — K559 Vascular disorder of intestine, unspecified: Secondary | ICD-10-CM | POA: Diagnosis present

## 2021-04-25 DIAGNOSIS — Z7984 Long term (current) use of oral hypoglycemic drugs: Secondary | ICD-10-CM

## 2021-04-25 DIAGNOSIS — N179 Acute kidney failure, unspecified: Secondary | ICD-10-CM | POA: Diagnosis present

## 2021-04-25 LAB — COMPREHENSIVE METABOLIC PANEL
ALT: 28 U/L (ref 0–44)
AST: 32 U/L (ref 15–41)
Albumin: 4.3 g/dL (ref 3.5–5.0)
Alkaline Phosphatase: 51 U/L (ref 38–126)
Anion gap: 12 (ref 5–15)
BUN: 16 mg/dL (ref 6–20)
CO2: 21 mmol/L — ABNORMAL LOW (ref 22–32)
Calcium: 9 mg/dL (ref 8.9–10.3)
Chloride: 100 mmol/L (ref 98–111)
Creatinine, Ser: 1.28 mg/dL — ABNORMAL HIGH (ref 0.61–1.24)
GFR, Estimated: >60 mL/min (ref >60)
Glucose, Bld: 280 mg/dL — ABNORMAL HIGH (ref 70–99)
Potassium: 3.8 mmol/L (ref 3.5–5.1)
Sodium: 133 mmol/L — ABNORMAL LOW (ref 135–145)
Total Bilirubin: 1.6 mg/dL — ABNORMAL HIGH (ref 0.3–1.2)
Total Protein: 7.4 g/dL (ref 6.5–8.1)

## 2021-04-25 LAB — CBC WITH DIFFERENTIAL/PLATELET
Abs Immature Granulocytes: 0.09 10*3/uL — ABNORMAL HIGH (ref 0.00–0.07)
Basophils Absolute: 0.1 10*3/uL (ref 0.0–0.1)
Basophils Relative: 1 %
Eosinophils Absolute: 0.2 10*3/uL (ref 0.0–0.5)
Eosinophils Relative: 1 %
HCT: 52.2 % — ABNORMAL HIGH (ref 39.0–52.0)
Hemoglobin: 18.6 g/dL — ABNORMAL HIGH (ref 13.0–17.0)
Immature Granulocytes: 1 %
Lymphocytes Relative: 9 %
Lymphs Abs: 1.6 10*3/uL (ref 0.7–4.0)
MCH: 32.5 pg (ref 26.0–34.0)
MCHC: 35.6 g/dL (ref 30.0–36.0)
MCV: 91.3 fL (ref 80.0–100.0)
Monocytes Absolute: 0.8 10*3/uL (ref 0.1–1.0)
Monocytes Relative: 4 %
Neutro Abs: 15.8 10*3/uL — ABNORMAL HIGH (ref 1.7–7.7)
Neutrophils Relative %: 84 %
Platelets: 216 10*3/uL (ref 150–400)
RBC: 5.72 MIL/uL (ref 4.22–5.81)
RDW: 12 % (ref 11.5–15.5)
WBC: 18.5 10*3/uL — ABNORMAL HIGH (ref 4.0–10.5)
nRBC: 0 % (ref 0.0–0.2)

## 2021-04-25 LAB — LIPASE, BLOOD: Lipase: 29 U/L (ref 11–51)

## 2021-04-25 MED ORDER — ONDANSETRON HCL 4 MG/2ML IJ SOLN
4.0000 mg | Freq: Once | INTRAMUSCULAR | Status: AC
  Administered 2021-04-25: 4 mg via INTRAVENOUS
  Filled 2021-04-25: qty 2

## 2021-04-25 MED ORDER — IOHEXOL 300 MG/ML  SOLN
100.0000 mL | Freq: Once | INTRAMUSCULAR | Status: AC | PRN
  Administered 2021-04-25: 100 mL via INTRAVENOUS

## 2021-04-25 MED ORDER — MORPHINE SULFATE (PF) 4 MG/ML IV SOLN
4.0000 mg | Freq: Once | INTRAVENOUS | Status: AC
  Administered 2021-04-25: 4 mg via INTRAVENOUS
  Filled 2021-04-25: qty 1

## 2021-04-25 NOTE — ED Provider Triage Note (Signed)
Emergency Medicine Provider Triage Evaluation Note  EDVIN ALBUS a 56 y.o. male  was evaluated in triage.  Pt complains of sudden onset of right-sided lower abdominal pain and associated nausea.  Patient presents with 10 out of 10 pain with symptoms onset about 4 hours prior to arrival.  He gives remote history of colostomy (15+ years ago), but denies any history of diverticulitis, kidney stones, or appendectomy.   Review of Systems  Positive: RLQ abd pain, nausea Negative: FCS, diarrhea  Physical Exam  There were no vitals taken for this visit. Gen:   Awake, no distress  uncomfortable Resp:  Normal effort CTA MSK:   Moves extremities without difficulty  Other:  ABD: distended, tender to RLQ,stable umbilical hernia  Medical Decision Making  Medically screening exam initiated at 10:17 PM.  Appropriate orders placed.  TIERRE NETTO was informed that the remainder of the evaluation will be completed by another provider, this initial triage assessment does not replace that evaluation, and the importance of remaining in the ED until their evaluation is complete.  Patient ED evaluation of sudden RLQ abdominal pain and nausea.   Lissa Hoard, PA-C 04/25/21 2227

## 2021-04-25 NOTE — ED Triage Notes (Signed)
Pt c/o abdominal pain that started a few hours ago, has had nausea, denies vomiting.  Pt c/o dizziness and feeling sweaty.  Pt pale, friend states color is unusual for him.

## 2021-04-26 ENCOUNTER — Inpatient Hospital Stay: Payer: 59

## 2021-04-26 ENCOUNTER — Inpatient Hospital Stay
Admission: EM | Admit: 2021-04-26 | Discharge: 2021-05-03 | DRG: 329 | Disposition: A | Discharge status: Home / Self Care | Payer: 59 | Attending: Student | Admitting: Student

## 2021-04-26 ENCOUNTER — Encounter: Payer: Self-pay | Admitting: General Surgery

## 2021-04-26 ENCOUNTER — Inpatient Hospital Stay: Payer: 59 | Admitting: Anesthesiology

## 2021-04-26 ENCOUNTER — Encounter
Admission: EM | Disposition: A | Discharge status: Home / Self Care | Payer: Self-pay | Source: Home / Self Care | Attending: Student

## 2021-04-26 DIAGNOSIS — E785 Hyperlipidemia, unspecified: Secondary | ICD-10-CM | POA: Diagnosis not present

## 2021-04-26 DIAGNOSIS — K5720 Diverticulitis of large intestine with perforation and abscess without bleeding: Secondary | ICD-10-CM | POA: Diagnosis present

## 2021-04-26 DIAGNOSIS — K5730 Diverticulosis of large intestine without perforation or abscess without bleeding: Secondary | ICD-10-CM | POA: Diagnosis not present

## 2021-04-26 DIAGNOSIS — E1169 Type 2 diabetes mellitus with other specified complication: Secondary | ICD-10-CM | POA: Diagnosis not present

## 2021-04-26 DIAGNOSIS — Z79899 Other long term (current) drug therapy: Secondary | ICD-10-CM | POA: Diagnosis not present

## 2021-04-26 DIAGNOSIS — E1165 Type 2 diabetes mellitus with hyperglycemia: Secondary | ICD-10-CM | POA: Diagnosis not present

## 2021-04-26 DIAGNOSIS — R7881 Bacteremia: Secondary | ICD-10-CM

## 2021-04-26 DIAGNOSIS — U071 COVID-19: Secondary | ICD-10-CM | POA: Diagnosis not present

## 2021-04-26 DIAGNOSIS — K559 Vascular disorder of intestine, unspecified: Secondary | ICD-10-CM | POA: Diagnosis not present

## 2021-04-26 DIAGNOSIS — K567 Ileus, unspecified: Secondary | ICD-10-CM | POA: Diagnosis present

## 2021-04-26 DIAGNOSIS — K430 Incisional hernia with obstruction, without gangrene: Secondary | ICD-10-CM | POA: Diagnosis present

## 2021-04-26 DIAGNOSIS — I10 Essential (primary) hypertension: Secondary | ICD-10-CM

## 2021-04-26 DIAGNOSIS — J9601 Acute respiratory failure with hypoxia: Secondary | ICD-10-CM | POA: Diagnosis not present

## 2021-04-26 DIAGNOSIS — N179 Acute kidney failure, unspecified: Secondary | ICD-10-CM | POA: Diagnosis present

## 2021-04-26 DIAGNOSIS — J1282 Pneumonia due to coronavirus disease 2019: Secondary | ICD-10-CM | POA: Diagnosis present

## 2021-04-26 DIAGNOSIS — E119 Type 2 diabetes mellitus without complications: Secondary | ICD-10-CM

## 2021-04-26 DIAGNOSIS — J90 Pleural effusion, not elsewhere classified: Secondary | ICD-10-CM | POA: Diagnosis not present

## 2021-04-26 DIAGNOSIS — B9620 Unspecified Escherichia coli [E. coli] as the cause of diseases classified elsewhere: Secondary | ICD-10-CM | POA: Diagnosis present

## 2021-04-26 DIAGNOSIS — K650 Generalized (acute) peritonitis: Secondary | ICD-10-CM | POA: Diagnosis not present

## 2021-04-26 DIAGNOSIS — Z7952 Long term (current) use of systemic steroids: Secondary | ICD-10-CM | POA: Diagnosis not present

## 2021-04-26 DIAGNOSIS — R918 Other nonspecific abnormal finding of lung field: Secondary | ICD-10-CM | POA: Diagnosis not present

## 2021-04-26 DIAGNOSIS — J9811 Atelectasis: Secondary | ICD-10-CM | POA: Diagnosis not present

## 2021-04-26 DIAGNOSIS — K660 Peritoneal adhesions (postprocedural) (postinfection): Secondary | ICD-10-CM | POA: Diagnosis present

## 2021-04-26 DIAGNOSIS — F17290 Nicotine dependence, other tobacco product, uncomplicated: Secondary | ICD-10-CM | POA: Diagnosis present

## 2021-04-26 DIAGNOSIS — T380X5A Adverse effect of glucocorticoids and synthetic analogues, initial encounter: Secondary | ICD-10-CM | POA: Diagnosis present

## 2021-04-26 DIAGNOSIS — K599 Functional intestinal disorder, unspecified: Secondary | ICD-10-CM | POA: Diagnosis not present

## 2021-04-26 DIAGNOSIS — K631 Perforation of intestine (nontraumatic): Secondary | ICD-10-CM | POA: Diagnosis present

## 2021-04-26 DIAGNOSIS — R0902 Hypoxemia: Secondary | ICD-10-CM

## 2021-04-26 DIAGNOSIS — R0602 Shortness of breath: Secondary | ICD-10-CM | POA: Diagnosis not present

## 2021-04-26 DIAGNOSIS — B952 Enterococcus as the cause of diseases classified elsewhere: Secondary | ICD-10-CM | POA: Diagnosis present

## 2021-04-26 DIAGNOSIS — Z7984 Long term (current) use of oral hypoglycemic drugs: Secondary | ICD-10-CM | POA: Diagnosis not present

## 2021-04-26 DIAGNOSIS — E7800 Pure hypercholesterolemia, unspecified: Secondary | ICD-10-CM | POA: Diagnosis present

## 2021-04-26 HISTORY — PX: COLON RESECTION SIGMOID: SHX6737

## 2021-04-26 HISTORY — PX: INCISIONAL HERNIA REPAIR: SHX193

## 2021-04-26 HISTORY — PX: APPENDECTOMY: SHX54

## 2021-04-26 HISTORY — PX: ILEOSTOMY: SHX1783

## 2021-04-26 LAB — BLOOD CULTURE ID PANEL (REFLEXED) - BCID2

## 2021-04-26 LAB — RESP PANEL BY RT-PCR (FLU A&B, COVID) ARPGX2
Influenza A by PCR: NEGATIVE
Influenza B by PCR: NEGATIVE
SARS Coronavirus 2 by RT PCR: POSITIVE — AB

## 2021-04-26 LAB — HEMOGLOBIN A1C
Hgb A1c MFr Bld: 7.1 % — ABNORMAL HIGH (ref 4.8–5.6)
Mean Plasma Glucose: 157.07 mg/dL

## 2021-04-26 LAB — GLUCOSE, CAPILLARY
Glucose-Capillary: 285 mg/dL — ABNORMAL HIGH (ref 70–99)
Glucose-Capillary: 203 mg/dL — ABNORMAL HIGH (ref 70–99)
Glucose-Capillary: 143 mg/dL — ABNORMAL HIGH (ref 70–99)
Glucose-Capillary: 184 mg/dL — ABNORMAL HIGH (ref 70–99)

## 2021-04-26 LAB — LACTIC ACID, PLASMA: Lactic Acid, Venous: 3.8 mmol/L (ref 0.5–1.9)

## 2021-04-26 LAB — CBG MONITORING, ED: Glucose-Capillary: 273 mg/dL — ABNORMAL HIGH (ref 70–99)

## 2021-04-26 SURGERY — APPENDECTOMY
Anesthesia: General | Site: Abdomen

## 2021-04-26 MED ORDER — ROCURONIUM BROMIDE 10 MG/ML (PF) SYRINGE
PREFILLED_SYRINGE | INTRAVENOUS | Status: AC
  Filled 2021-04-26: qty 10

## 2021-04-26 MED ORDER — CHLORHEXIDINE GLUCONATE CLOTH 2 % EX PADS
6.0000 | MEDICATED_PAD | Freq: Every day | CUTANEOUS | Status: DC
  Administered 2021-04-26 – 2021-04-27 (×2): 6 via TOPICAL

## 2021-04-26 MED ORDER — INSULIN ASPART 100 UNIT/ML IJ SOLN
0.0000 [IU] | Freq: Three times a day (TID) | INTRAMUSCULAR | Status: DC
  Administered 2021-04-26: 5 [IU] via SUBCUTANEOUS
  Administered 2021-04-26: 8 [IU] via SUBCUTANEOUS
  Administered 2021-04-27: 3 [IU] via SUBCUTANEOUS
  Administered 2021-04-27: 2 [IU] via SUBCUTANEOUS
  Administered 2021-04-28 (×2): 3 [IU] via SUBCUTANEOUS
  Administered 2021-04-28 – 2021-04-29 (×2): 5 [IU] via SUBCUTANEOUS
  Administered 2021-04-29 – 2021-04-30 (×3): 3 [IU] via SUBCUTANEOUS
  Administered 2021-04-30: 5 [IU] via SUBCUTANEOUS
  Administered 2021-04-30: 2 [IU] via SUBCUTANEOUS
  Administered 2021-05-01 (×2): 3 [IU] via SUBCUTANEOUS
  Administered 2021-05-01: 8 [IU] via SUBCUTANEOUS
  Administered 2021-05-02: 5 [IU] via SUBCUTANEOUS
  Administered 2021-05-02 (×2): 8 [IU] via SUBCUTANEOUS
  Administered 2021-05-03: 3 [IU] via SUBCUTANEOUS
  Administered 2021-05-03: 8 [IU] via SUBCUTANEOUS
  Filled 2021-04-26 (×21): qty 1

## 2021-04-26 MED ORDER — ONDANSETRON HCL 4 MG/2ML IJ SOLN
4.0000 mg | Freq: Four times a day (QID) | INTRAMUSCULAR | Status: DC | PRN
Start: 2021-04-26 — End: 2021-05-03

## 2021-04-26 MED ORDER — PHENYLEPHRINE HCL (PRESSORS) 10 MG/ML IV SOLN
INTRAVENOUS | Status: DC | PRN
  Administered 2021-04-26 (×2): 80 ug via INTRAVENOUS

## 2021-04-26 MED ORDER — FENTANYL CITRATE (PF) 100 MCG/2ML IJ SOLN: 25.0000 ug | INTRAMUSCULAR | Status: DC | PRN

## 2021-04-26 MED ORDER — FENTANYL CITRATE (PF) 100 MCG/2ML IJ SOLN
INTRAMUSCULAR | Status: DC | PRN
  Administered 2021-04-26 (×7): 50 ug via INTRAVENOUS

## 2021-04-26 MED ORDER — MORPHINE SULFATE (PF) 4 MG/ML IV SOLN
4.0000 mg | Freq: Once | INTRAVENOUS | Status: AC
  Administered 2021-04-26: 4 mg via INTRAVENOUS
  Filled 2021-04-26: qty 1

## 2021-04-26 MED ORDER — BUPIVACAINE-EPINEPHRINE (PF) 0.5% -1:200000 IJ SOLN
INTRAMUSCULAR | Status: DC | PRN
  Administered 2021-04-26: 50 mL via INTRAMUSCULAR

## 2021-04-26 MED ORDER — SODIUM CHLORIDE FLUSH 0.9 % IV SOLN
INTRAVENOUS | Status: AC
  Filled 2021-04-26: qty 30

## 2021-04-26 MED ORDER — MIDAZOLAM HCL 2 MG/2ML IJ SOLN
INTRAMUSCULAR | Status: DC | PRN
  Administered 2021-04-26: 2 mg via INTRAVENOUS

## 2021-04-26 MED ORDER — LACTATED RINGERS IV BOLUS
1000.0000 mL | Freq: Once | INTRAVENOUS | Status: AC
  Administered 2021-04-26: 1000 mL via INTRAVENOUS

## 2021-04-26 MED ORDER — DEXMEDETOMIDINE HCL IN NACL 400 MCG/100ML IV SOLN
INTRAVENOUS | Status: DC | PRN
Start: 2021-04-26 — End: 2021-04-26
  Administered 2021-04-26 (×3): 4 ug via INTRAVENOUS

## 2021-04-26 MED ORDER — ONDANSETRON 4 MG PO TBDP: 4.0000 mg | ORAL_TABLET | Freq: Four times a day (QID) | ORAL | Status: DC | PRN

## 2021-04-26 MED ORDER — PANTOPRAZOLE SODIUM 40 MG IV SOLR
40.0000 mg | Freq: Every day | INTRAVENOUS | Status: DC
  Administered 2021-04-26 – 2021-04-28 (×3): 40 mg via INTRAVENOUS
  Filled 2021-04-26 (×3): qty 40

## 2021-04-26 MED ORDER — ONDANSETRON HCL 4 MG/2ML IJ SOLN
INTRAMUSCULAR | Status: AC
  Filled 2021-04-26: qty 2

## 2021-04-26 MED ORDER — PROPOFOL 500 MG/50ML IV EMUL
INTRAVENOUS | Status: AC
  Filled 2021-04-26: qty 50

## 2021-04-26 MED ORDER — SUGAMMADEX SODIUM 500 MG/5ML IV SOLN
INTRAVENOUS | Status: DC | PRN
  Administered 2021-04-26: 200 mg via INTRAVENOUS

## 2021-04-26 MED ORDER — MORPHINE SULFATE (PF) 4 MG/ML IV SOLN: 4.0000 mg | INTRAVENOUS | Status: DC | PRN

## 2021-04-26 MED ORDER — EPHEDRINE 5 MG/ML INJ
INTRAVENOUS | Status: AC
  Filled 2021-04-26: qty 5

## 2021-04-26 MED ORDER — ACETAMINOPHEN 10 MG/ML IV SOLN
INTRAVENOUS | Status: AC
  Filled 2021-04-26: qty 100

## 2021-04-26 MED ORDER — PIPERACILLIN-TAZOBACTAM 3.375 G IVPB 30 MIN
3.3750 g | Freq: Once | INTRAVENOUS | Status: AC
  Administered 2021-04-26: 3.375 g via INTRAVENOUS
  Filled 2021-04-26: qty 50

## 2021-04-26 MED ORDER — BUPIVACAINE-EPINEPHRINE (PF) 0.5% -1:200000 IJ SOLN
INTRAMUSCULAR | Status: AC
  Filled 2021-04-26: qty 30

## 2021-04-26 MED ORDER — PHENYLEPHRINE HCL-NACL 20-0.9 MG/250ML-% IV SOLN
INTRAVENOUS | Status: DC | PRN
  Administered 2021-04-26: 40 ug/min via INTRAVENOUS

## 2021-04-26 MED ORDER — FENTANYL CITRATE (PF) 100 MCG/2ML IJ SOLN
INTRAMUSCULAR | Status: AC
  Filled 2021-04-26: qty 2

## 2021-04-26 MED ORDER — PIPERACILLIN-TAZOBACTAM 3.375 G IVPB
3.3750 g | Freq: Three times a day (TID) | INTRAVENOUS | Status: DC
  Administered 2021-04-26: 3.375 g via INTRAVENOUS

## 2021-04-26 MED ORDER — INSULIN ASPART 100 UNIT/ML IJ SOLN: 0.0000 [IU] | INTRAMUSCULAR | Status: DC

## 2021-04-26 MED ORDER — ALBUTEROL SULFATE (2.5 MG/3ML) 0.083% IN NEBU: 2.5000 mg | INHALATION_SOLUTION | Freq: Four times a day (QID) | RESPIRATORY_TRACT | Status: DC | PRN

## 2021-04-26 MED ORDER — MORPHINE SULFATE (PF) 4 MG/ML IV SOLN
4.0000 mg | INTRAVENOUS | Status: DC | PRN
  Administered 2021-04-26 (×2): 4 mg via INTRAVENOUS
  Filled 2021-04-26 (×2): qty 1

## 2021-04-26 MED ORDER — PIPERACILLIN-TAZOBACTAM 3.375 G IVPB
3.3750 g | Freq: Three times a day (TID) | INTRAVENOUS | Status: AC
  Administered 2021-04-26 – 2021-05-01 (×15): 3.375 g via INTRAVENOUS
  Filled 2021-04-26 (×15): qty 50

## 2021-04-26 MED ORDER — ENOXAPARIN SODIUM 40 MG/0.4ML IJ SOSY: 40.0000 mg | PREFILLED_SYRINGE | Freq: Every day | INTRAMUSCULAR | Status: DC

## 2021-04-26 MED ORDER — LIDOCAINE HCL (CARDIAC) PF 100 MG/5ML IV SOSY
PREFILLED_SYRINGE | INTRAVENOUS | Status: DC | PRN
  Administered 2021-04-26: 100 mg via INTRAVENOUS

## 2021-04-26 MED ORDER — LOSARTAN POTASSIUM 50 MG PO TABS: 25.0000 mg | ORAL_TABLET | Freq: Every day | ORAL | Status: DC

## 2021-04-26 MED ORDER — PROMETHAZINE HCL 25 MG/ML IJ SOLN: 6.2500 mg | INTRAMUSCULAR | Status: DC | PRN

## 2021-04-26 MED ORDER — PANTOPRAZOLE SODIUM 40 MG IV SOLR: 40.0000 mg | Freq: Every day | INTRAVENOUS | Status: DC

## 2021-04-26 MED ORDER — ENOXAPARIN SODIUM 60 MG/0.6ML IJ SOSY
0.5000 mg/kg | PREFILLED_SYRINGE | INTRAMUSCULAR | Status: DC
  Administered 2021-04-27 – 2021-05-03 (×7): 50 mg via SUBCUTANEOUS
  Filled 2021-04-26 (×7): qty 0.6

## 2021-04-26 MED ORDER — HYDRALAZINE HCL 20 MG/ML IJ SOLN: 5.0000 mg | INTRAMUSCULAR | Status: DC | PRN

## 2021-04-26 MED ORDER — SODIUM CHLORIDE 0.9 % IV SOLN: INTRAVENOUS | Status: DC

## 2021-04-26 MED ORDER — HYDROMORPHONE HCL 1 MG/ML IJ SOLN
1.0000 mg | INTRAMUSCULAR | Status: DC | PRN
  Administered 2021-04-26 – 2021-05-03 (×17): 1 mg via INTRAVENOUS
  Filled 2021-04-26 (×17): qty 1

## 2021-04-26 MED ORDER — 0.9 % SODIUM CHLORIDE (POUR BTL) OPTIME
TOPICAL | Status: DC | PRN
  Administered 2021-04-26: 5000 mL

## 2021-04-26 MED ORDER — LACTATED RINGERS IV SOLN
INTRAVENOUS | Status: DC | PRN
Start: 2021-04-26 — End: 2021-04-26

## 2021-04-26 MED ORDER — LIDOCAINE HCL (PF) 2 % IJ SOLN
INTRAMUSCULAR | Status: AC
  Filled 2021-04-26: qty 5

## 2021-04-26 MED ORDER — ONDANSETRON HCL 4 MG/2ML IJ SOLN: 4.0000 mg | Freq: Four times a day (QID) | INTRAMUSCULAR | Status: DC | PRN

## 2021-04-26 MED ORDER — SUCCINYLCHOLINE CHLORIDE 200 MG/10ML IV SOSY
PREFILLED_SYRINGE | INTRAVENOUS | Status: AC
  Filled 2021-04-26: qty 10

## 2021-04-26 MED ORDER — SUGAMMADEX SODIUM 500 MG/5ML IV SOLN
INTRAVENOUS | Status: AC
  Filled 2021-04-26: qty 5

## 2021-04-26 MED ORDER — HYDROCODONE-ACETAMINOPHEN 5-325 MG PO TABS: 1.0000 | ORAL_TABLET | ORAL | Status: DC | PRN

## 2021-04-26 MED ORDER — ONDANSETRON HCL 4 MG/2ML IJ SOLN
INTRAMUSCULAR | Status: DC | PRN
Start: 2021-04-26 — End: 2021-04-26
  Administered 2021-04-26: 4 mg via INTRAVENOUS

## 2021-04-26 MED ORDER — CELECOXIB 200 MG PO CAPS
200.0000 mg | ORAL_CAPSULE | Freq: Two times a day (BID) | ORAL | Status: DC
  Filled 2021-04-26 (×3): qty 1

## 2021-04-26 MED ORDER — PIPERACILLIN-TAZOBACTAM 3.375 G IVPB
INTRAVENOUS | Status: AC
  Filled 2021-04-26: qty 50

## 2021-04-26 MED ORDER — ONDANSETRON 4 MG PO TBDP
4.0000 mg | ORAL_TABLET | Freq: Four times a day (QID) | ORAL | Status: DC | PRN
Start: 2021-04-26 — End: 2021-05-03

## 2021-04-26 MED ORDER — GABAPENTIN 300 MG PO CAPS
300.0000 mg | ORAL_CAPSULE | Freq: Two times a day (BID) | ORAL | Status: DC
  Administered 2021-04-27 – 2021-05-03 (×12): 300 mg via ORAL
  Filled 2021-04-26 (×13): qty 1

## 2021-04-26 MED ORDER — ACETAMINOPHEN 325 MG RE SUPP: 650.0000 mg | Freq: Four times a day (QID) | RECTAL | Status: DC | PRN

## 2021-04-26 MED ORDER — PROPOFOL 10 MG/ML IV BOLUS
INTRAVENOUS | Status: DC | PRN
  Administered 2021-04-26: 200 mg via INTRAVENOUS

## 2021-04-26 MED ORDER — BUPIVACAINE LIPOSOME 1.3 % IJ SUSP
INTRAMUSCULAR | Status: AC
  Filled 2021-04-26: qty 20

## 2021-04-26 MED ORDER — ACETAMINOPHEN 10 MG/ML IV SOLN
INTRAVENOUS | Status: DC | PRN
  Administered 2021-04-26: 1000 mg via INTRAVENOUS

## 2021-04-26 MED ORDER — ROCURONIUM BROMIDE 100 MG/10ML IV SOLN
INTRAVENOUS | Status: DC | PRN
  Administered 2021-04-26: 20 mg via INTRAVENOUS
  Administered 2021-04-26: 40 mg via INTRAVENOUS
  Administered 2021-04-26 (×3): 20 mg via INTRAVENOUS
  Administered 2021-04-26: 10 mg via INTRAVENOUS
  Administered 2021-04-26: 20 mg via INTRAVENOUS

## 2021-04-26 MED ORDER — MIDAZOLAM HCL 2 MG/2ML IJ SOLN
INTRAMUSCULAR | Status: AC
  Filled 2021-04-26: qty 2

## 2021-04-26 MED ORDER — SUCCINYLCHOLINE CHLORIDE 200 MG/10ML IV SOSY
PREFILLED_SYRINGE | INTRAVENOUS | Status: DC | PRN
  Administered 2021-04-26: 100 mg via INTRAVENOUS

## 2021-04-26 MED ORDER — HYDROCODONE-ACETAMINOPHEN 5-325 MG PO TABS
1.0000 | ORAL_TABLET | ORAL | Status: DC | PRN
  Administered 2021-04-28: 2 via ORAL
  Administered 2021-05-01: 1 via ORAL
  Filled 2021-04-26: qty 1
  Filled 2021-04-26: qty 2
  Filled 2021-04-26: qty 1

## 2021-04-26 MED ORDER — ACETAMINOPHEN 325 MG PO TABS: 650.0000 mg | ORAL_TABLET | Freq: Four times a day (QID) | ORAL | Status: DC | PRN

## 2021-04-26 SURGICAL SUPPLY — 46 items
BULB RESERV EVAC DRAIN JP 100C (MISCELLANEOUS) ×2 IMPLANT
CATH SCT 14FR 2 3ANG EYE CNTRL (CATHETERS) ×1 IMPLANT
CATH SUCT 14FR (CATHETERS) ×1
CHLORAPREP W/TINT 26 (MISCELLANEOUS) ×1 IMPLANT
DRAPE LAPAROTOMY 100X77 ABD (DRAPES) ×3 IMPLANT
DRSG OPSITE POSTOP 4X10 (GAUZE/BANDAGES/DRESSINGS) ×1 IMPLANT
DRSG OPSITE POSTOP 4X12 (GAUZE/BANDAGES/DRESSINGS) ×2 IMPLANT
DRSG OPSITE POSTOP 4X8 (GAUZE/BANDAGES/DRESSINGS) ×1 IMPLANT
ELECT REM PT RETURN 9FT ADLT (ELECTROSURGICAL) ×3
ELECTRODE REM PT RTRN 9FT ADLT (ELECTROSURGICAL) ×2 IMPLANT
GAUZE 4X4 16PLY ~~LOC~~+RFID DBL (SPONGE) ×1 IMPLANT
GLOVE SURG ENC MOIS LTX SZ6.5 (GLOVE) ×9 IMPLANT
GLOVE SURG UNDER POLY LF SZ6.5 (GLOVE) ×7 IMPLANT
GOWN STRL REUS W/ TWL LRG LVL3 (GOWN DISPOSABLE) ×4 IMPLANT
GOWN STRL REUS W/TWL LRG LVL3 (GOWN DISPOSABLE) ×2
KIT OSTOMY 2 PC DRNBL 2.25 STR (WOUND CARE) ×1 IMPLANT
KIT OSTOMY DRAINABLE 2.25 STR (WOUND CARE) ×1
KIT TURNOVER KIT A (KITS) ×3 IMPLANT
LABEL OR SOLS (LABEL) ×3 IMPLANT
MANIFOLD NEPTUNE II (INSTRUMENTS) ×3 IMPLANT
NS IRRIG 1000ML POUR BTL (IV SOLUTION) ×13 IMPLANT
PACK BASIN MAJOR ARMC (MISCELLANEOUS) ×3 IMPLANT
PACK COLON CLEAN CLOSURE (MISCELLANEOUS) ×3 IMPLANT
RELOAD PROXIMATE 75MM BLUE (ENDOMECHANICALS) ×3 IMPLANT
RELOAD STAPLE 75 3.8 BLU REG (ENDOMECHANICALS) IMPLANT
SEALER TISSUE X1 CVD JAW (INSTRUMENTS) ×2 IMPLANT
SPONGE T-LAP 18X18 ~~LOC~~+RFID (SPONGE) ×10 IMPLANT
SPONGE VERSALON 4X4 4PLY (MISCELLANEOUS) ×2 IMPLANT
STAPLER PROXIMATE 75MM BLUE (STAPLE) ×2 IMPLANT
STAPLER SKIN PROX 35W (STAPLE) ×2 IMPLANT
SUT ETHILON 3-0 FS-10 30 BLK (SUTURE) ×3
SUT PDS 2-0 27IN (SUTURE) ×6 IMPLANT
SUT PDS AB 0 CT1 27 (SUTURE) ×6 IMPLANT
SUT PROLENE 2 0 SH DA (SUTURE) ×2 IMPLANT
SUT SILK 2 0 (SUTURE) ×1
SUT SILK 2-0 18XBRD TIE 12 (SUTURE) ×2 IMPLANT
SUT SILK 3 0 (SUTURE) ×1
SUT SILK 3-0 18XBRD TIE 12 (SUTURE) ×2 IMPLANT
SUT VIC AB 3-0 SH 27 (SUTURE) ×2
SUT VIC AB 3-0 SH 27X BRD (SUTURE) ×4 IMPLANT
SUT VICRYL 3-0 RB1 18 ABS (SUTURE) ×4 IMPLANT
SUT VLOC 90 2/L VL 12 GS22 (SUTURE) ×4 IMPLANT
SUTURE EHLN 3-0 FS-10 30 BLK (SUTURE) ×1 IMPLANT
TRAY FOLEY MTR SLVR 16FR STAT (SET/KITS/TRAYS/PACK) ×3 IMPLANT
WATER STERILE IRR 500ML POUR (IV SOLUTION) ×3 IMPLANT
silcone round drain ×2 IMPLANT

## 2021-04-26 NOTE — ED Provider Notes (Addendum)
[facility] Provider Note    Event Date/Time   First MD Initiated Contact with Patient 04/26/21 0022     (approximate)   History   Abdominal Pain   HPI  Timothy Cameron is a 56 y.o. male with a history of diabetes, hypertension, hyperlipidemia, partial bowel resection from an SBO who presents for evaluation of abdominal pain.  Patient reports that symptoms started several hours prior to arrival.  Is complaining of diffuse sharp abdominal pain worse on the right lower quadrant associated with nausea but no vomiting.  Is passing flatus, no diarrhea or constipation, no chest pain or shortness of breath, no fever or chills.  His pain is severe and constant.     Past Medical History:  Diagnosis Date   Diabetes (HCC)    High cholesterol    Hypertension     Past Surgical History:  Procedure Laterality Date   ABDOMINAL SURGERY     COLONOSCOPY N/A 03/09/2016   Procedure: COLONOSCOPY;  Surgeon: Charlott Rakes, MD;  Location: [facility] ENDOSCOPY;  Service: Endoscopy;  Laterality: N/A;     Physical Exam   Triage Vital Signs: ED Triage Vitals  Enc Vitals Group     BP 04/25/21 2219 94/68     Pulse Rate 04/25/21 2219 83     Resp 04/25/21 2219 20     Temp 04/25/21 2219 97.8 F (36.6 C)     Temp Source 04/25/21 2219 Oral     SpO2 04/25/21 2219 93 %     Weight 04/25/21 2222 220 lb (99.8 kg)     Height 04/25/21 2222 5\' 11"  (1.803 m)     Head Circumference --      Peak Flow --      Pain Score 04/25/21 2219 10     Pain Loc --      Pain Edu? --      Excl. in GC? --     Most recent vital signs: Vitals:   04/25/21 2219 04/26/21 0035  BP: 94/68 132/89  Pulse: 83 89  Resp: 20 18  Temp: 97.8 F (36.6 C)   SpO2: 93% 98%     Constitutional: Alert and oriented. Well appearing and in no apparent distress. HEENT:      Head: Normocephalic and atraumatic.         Eyes: Conjunctivae are normal. Sclera is non-icteric.       Mouth/Throat: Mucous membranes  are moist.       Neck: Supple with no signs of meningismus. Cardiovascular: Regular rate and rhythm. No murmurs, gallops, or rubs. 2+ symmetrical distal pulses are present in all extremities.  Respiratory: Normal respiratory effort. Lungs are clear to auscultation bilaterally.  Gastrointestinal: Soft, diffusely tender to palpation with rebound and guarding . Genitourinary: No CVA tenderness. Musculoskeletal:  No edema, cyanosis, or erythema of extremities. Neurologic: Normal speech and language. Face is symmetric. Moving all extremities. No gross focal neurologic deficits are appreciated. Skin: Skin is warm, dry and intact. No rash noted. Psychiatric: Mood and affect are normal. Speech and behavior are normal.  ED Results / Procedures / Treatments   Labs (all labs ordered are listed, but only abnormal results are displayed) Labs Reviewed  COMPREHENSIVE METABOLIC PANEL - Abnormal; Notable for the following components:      Result Value   Sodium 133 (*)    CO2 21 (*)    Glucose, Bld 280 (*)    Creatinine, Ser 1.28 (*)    Total Bilirubin 1.6 (*)  All other components within normal limits  CBC WITH DIFFERENTIAL/PLATELET - Abnormal; Notable for the following components:   WBC 18.5 (*)    Hemoglobin 18.6 (*)    HCT 52.2 (*)    Neutro Abs 15.8 (*)    Abs Immature Granulocytes 0.09 (*)    All other components within normal limits  CULTURE, BLOOD (ROUTINE X 2)  CULTURE, BLOOD (ROUTINE X 2)  RESP PANEL BY RT-PCR (FLU A&B, COVID) ARPGX2  LIPASE, BLOOD  URINALYSIS, ROUTINE W REFLEX MICROSCOPIC  LACTIC ACID, PLASMA  LACTIC ACID, PLASMA     EKG  none   RADIOLOGY I have personally reviewed the images performed during this visit and I agree with the Radiologist's read.   Interpretation by Radiologist:  CT ABDOMEN PELVIS W CONTRAST  Result Date: 04/26/2021 CLINICAL DATA:  RLQ abdominal pain (Age >= 14y) EXAM: CT ABDOMEN AND PELVIS WITH CONTRAST TECHNIQUE: Multidetector CT imaging  of the abdomen and pelvis was performed using the standard protocol following bolus administration of intravenous contrast. CONTRAST:  OMNIPAQUE IOHEXOL 300 MG/ML  SOLN COMPARISON:  None. FINDINGS: Lower chest: No acute abnormality. Hepatobiliary: No focal liver abnormality. No gallstones, gallbladder wall thickening, or pericholecystic fluid. No biliary dilatation. Pancreas: No focal lesion. Normal pancreatic contour. No surrounding inflammatory changes. No main pancreatic ductal dilatation. Spleen: Normal in size without focal abnormality. Adrenals/Urinary Tract: No adrenal nodule bilaterally. Bilateral kidneys enhance symmetrically. No hydronephrosis. No hydroureter. The urinary bladder is unremarkable. Stomach/Bowel: Stomach is within normal limits. No evidence of bowel wall thickening or dilatation. No pneumatosis. Scattered colonic diverticulosis. Suggestion of appendectomy. Vascular/Lymphatic: No definite portal venous or mesenteric venous gas. The main portal, splenic, superior mesenteric veins are patent. No abdominal aorta or iliac aneurysm. Mild atherosclerotic plaque of the aorta and its branches. No abdominal, pelvic, or inguinal lymphadenopathy. Reproductive: Prostate is unremarkable. Other: Small volume free fluid and small volume pneumoperitoneum. Query origin of pneumoperitoneum and free fluid from a perforation involving the small bowel or rectum (5:43). No organized fluid collection. Musculoskeletal: Small fat containing umbilical hernia with an abdominal defect of 2.1 cm. Small fat containing left spigelian hernia just inferior to the level of the umbilicus with an abdominal defect of 0.9 cm. Possible tiny fat containing left inguinal hernia. No suspicious lytic or blastic osseous lesions. No acute displaced fracture. Multilevel degenerative changes of the spine. IMPRESSION: 1. Small volume pneumoperitoneum and small volume ascites likely originating from a bowel perforation. Likely origin  of perforation includes the small bowel or rectum in the anterior lower abdomen/pelvis. 2. Scattered colonic diverticulosis with no acute diverticulitis. 3. Small fat containing umbilical hernia with an abdominal defect of 2.1 cm. Small fat containing left spigelian hernia just inferior to the level of the umbilicus with an abdominal defect of 0.9 cm. Possible tiny fat containing left inguinal hernia. These results were called by telephone at the time of interpretation on 04/25/2021 at 11:57 pm to provider Dr. Elesa Massed, who verbally acknowledged these results. Electronically Signed   By: Tish Frederickson M.D.   On: 04/26/2021 00:03      PROCEDURES:  Critical Care performed: Yes, see critical care procedure note(s)  .Critical Care Performed by: Nita Sickle, MD Authorized by: Nita Sickle, MD   Critical care provider statement:    Critical care time (minutes):  30   Critical care was necessary to treat or prevent imminent or life-threatening deterioration of the following conditions:  Shock, dehydration, circulatory failure, respiratory failure, cardiac failure, renal failure and metabolic crisis  Critical care was time spent personally by me on the following activities:  Development of treatment plan with patient or surrogate, discussions with consultants, evaluation of patient's response to treatment, examination of patient, ordering and review of laboratory studies, ordering and review of radiographic studies, ordering and performing treatments and interventions, pulse oximetry, re-evaluation of patient's condition, review of old charts and obtaining history from patient or surrogate   I assumed direction of critical care for this patient from another provider in my specialty: no     Care discussed with: admitting provider      IMPRESSION / MDM / ASSESSMENT AND PLAN / ED COURSE  I reviewed the triage vital signs and the nursing notes.  56 y.o. male with a history of diabetes,  hypertension, hyperlipidemia, partial bowel resection from an SBO who presents for evaluation of abdominal pain that started a few hours prior to arrival.  Patient is hypotensive but afebrile with heart rate of 83.  Has diffuse tenderness to palpation with rebound and guarding concerning for surgical abdomen.  Ddx: Perforation versus volvulus versus SBO versus mesenteric ischemia versus ruptured AAA   Plan: CT, CBC, CMP, lipase, lactic acid, urinalysis.  Patient placed on telemetry for close monitoring of cardiorespiratory status.   MEDICATIONS GIVEN IN ED: Medications  piperacillin-tazobactam (ZOSYN) IVPB 3.375 g (has no administration in time range)  lactated ringers bolus 1,000 mL (has no administration in time range)  ondansetron (ZOFRAN) injection 4 mg (4 mg Intravenous Given 04/25/21 2236)  morphine 4 MG/ML injection 4 mg (4 mg Intravenous Given 04/25/21 2236)  iohexol (OMNIPAQUE) 300 MG/ML solution 100 mL (100 mLs Intravenous Contrast Given 04/25/21 2338)     ED COURSE: CT concerning for bowel perforation, reviewed by me and confirmed read by radiology.  Will initiate resuscitation with IV fluids, IV Zosyn.  COVID swab has been sent for preparation for surgery.  I consulted surgery and spoke with Dr. Maia Planintron who has accepted patient to his service and plan on operating on patient this evening.  This plan was discussed with the patient who is in agreement.  Patient given IV morphine and Zofran for nausea and pain.   Consults: Surgery   EMR reviewed EMR reviewed including patient's admission for lower GI bleeding and colonoscopy from 2017          FINAL CLINICAL IMPRESSION(S) / ED DIAGNOSES   Final diagnoses:  Bowel perforation (HCC)     Rx / DC Orders   ED Discharge Orders     None        Note:  This document was prepared using Dragon voice recognition software and may include unintentional dictation errors.    Don PerkingVeronese, [place], MD 04/26/21 [id number]    Nita SickleVeronese,  [place], MD 04/26/21 [id number]

## 2021-04-26 NOTE — Progress Notes (Signed)
[facility] Progress Note  Patient: Timothy Cameron    RJJ:[id number]  DOA: 04/26/2021     Date of Service: the patient was seen and examined on 04/26/2021  Chief Complaint  Patient presents with   Abdominal Pain   Brief hospital course: Timothy Cameron is a 56 y.o. 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  Patient is being taken to the OR at the time of this consultation and denies complaints outside of presenting symptoms of right-sided lower quadrant pain and nausea.  He denies a history of CAD, CHF, A. fib, syncope, seizures, stroke, COPD or OSA.  He denies cough or shortness of breath and at baseline is good exercise tolerance  Assessment and Plan:  Bowel perforation (HCC) S/p ex lap, JP drain intact NG tube intact with intermittent suction Patient is n.p.o. Continue IV fluid for hydration -Management per surgery   COVID-19 viral infection positive, incidental finding Patient denied any symptoms of pneumonia prior to admission Currently patient has not symptomatic, saturating well on room air Mild chest congestion noticed on exam Follow chest x-ray  Diabetes (HCC) - Sliding scale insulin coverage   HTN (hypertension) - Hydralazine IV as needed while NPO   HLD (hyperlipidemia) - Hold meds until oral route established  Body mass index is 30.68 kg/m.  Interventions:     Diet: NPO, advance per primary team General surgery DVT Prophylaxis: Subcutaneous Lovenox   Advance goals of care discussion: Full code  Family Communication: family was NOT present at bedside, at the time of interview.  The pt provided permission to discuss medical plan with the family. Opportunity was given to ask question and all questions were answered satisfactorily.   Disposition:  Pt is from Home, admitted with bowel perforation for ex lap under  general surgery, still n.p.o., NG tube with intermittent suction, which precludes a safe discharge. Discharge to home, when clinically stable.  Subjective: No significant overnight events, patient still has significant pain in the abdomen 10/10, patient said that it hurts even when he coughs a little bit.  Denied any worsening of breathing, no chest pain or palpitations.  Physical Exam: General:  alert oriented to time, place, and person.  Appear in moderate distress, affect appropriate Eyes: PERRLA ENT: Oral Mucosa Clear, moist  Neck: no JVD,  Cardiovascular: S1 and S2 Present, no Murmur,  Respiratory: good respiratory effort, Bilateral Air entry equal and Decreased, B/L Crackles, no wheezes Abdomen: Bowel sounds sluggish, soft and generalized tenderness, colostomy intact, JP drain intact, dressing soaked with dried blood, no active bleeding or discharge. Skin: No rashes Extremities: No pedal edema, no calf tenderness Neurologic: without any new focal findings Gait not checked due to patient safety concerns  Vitals:   04/26/21 0730 04/26/21 0759 04/26/21 0905 04/26/21 1000  BP:  123/76 126/85 129/78  Pulse: 90 89 85 79  Resp: 13 15 18 14   Temp: 98.7 F (37.1 C) 98.1 F (36.7 C) 98.2 F (36.8 C) 98.7 F (37.1 C)  TempSrc:      SpO2: 94% 96% 98% 98%  Weight:      Height:        Intake/Output Summary (Last 24 hours) at 04/26/2021 1455 Last data filed at 04/26/2021 1410 Gross per 24 hour  Intake 2800 ml  Output 2865 ml  Net -65 ml   Filed Weights   04/25/21 2222  Weight: 99.8 kg  Data Reviewed: I have personally reviewed and interpreted daily labs, tele strips, imagings as discussed above. I reviewed all nursing notes, pharmacy notes, vitals, pertinent old records I have discussed plan of care as described above with RN and patient/family.  CBC: Recent Labs  Lab 04/25/21 2232  WBC 18.5*  NEUTROABS 15.8*  HGB 18.6*  HCT 52.2*  MCV 91.3  PLT 216   Basic  Metabolic Panel: Recent Labs  Lab 04/25/21 2232  NA 133*  K 3.8  CL 100  CO2 21*  GLUCOSE 280*  BUN 16  CREATININE 1.28*  CALCIUM 9.0    Studies: CT ABDOMEN PELVIS W CONTRAST  Result Date: 04/26/2021 CLINICAL DATA:  RLQ abdominal pain (Age >= 14y) EXAM: CT ABDOMEN AND PELVIS WITH CONTRAST TECHNIQUE: Multidetector CT imaging of the abdomen and pelvis was performed using the standard protocol following bolus administration of intravenous contrast. CONTRAST:  OMNIPAQUE IOHEXOL 300 MG/ML  SOLN COMPARISON:  None. FINDINGS: Lower chest: No acute abnormality. Hepatobiliary: No focal liver abnormality. No gallstones, gallbladder wall thickening, or pericholecystic fluid. No biliary dilatation. Pancreas: No focal lesion. Normal pancreatic contour. No surrounding inflammatory changes. No main pancreatic ductal dilatation. Spleen: Normal in size without focal abnormality. Adrenals/Urinary Tract: No adrenal nodule bilaterally. Bilateral kidneys enhance symmetrically. No hydronephrosis. No hydroureter. The urinary bladder is unremarkable. Stomach/Bowel: Stomach is within normal limits. No evidence of bowel wall thickening or dilatation. No pneumatosis. Scattered colonic diverticulosis. Suggestion of appendectomy. Vascular/Lymphatic: No definite portal venous or mesenteric venous gas. The main portal, splenic, superior mesenteric veins are patent. No abdominal aorta or iliac aneurysm. Mild atherosclerotic plaque of the aorta and its branches. No abdominal, pelvic, or inguinal lymphadenopathy. Reproductive: Prostate is unremarkable. Other: Small volume free fluid and small volume pneumoperitoneum. Query origin of pneumoperitoneum and free fluid from a perforation involving the small bowel or rectum (5:43). No organized fluid collection. Musculoskeletal: Small fat containing umbilical hernia with an abdominal defect of 2.1 cm. Small fat containing left spigelian hernia just inferior to the level of the  umbilicus with an abdominal defect of 0.9 cm. Possible tiny fat containing left inguinal hernia. No suspicious lytic or blastic osseous lesions. No acute displaced fracture. Multilevel degenerative changes of the spine. IMPRESSION: 1. Small volume pneumoperitoneum and small volume ascites likely originating from a bowel perforation. Likely origin of perforation includes the small bowel or rectum in the anterior lower abdomen/pelvis. 2. Scattered colonic diverticulosis with no acute diverticulitis. 3. Small fat containing umbilical hernia with an abdominal defect of 2.1 cm. Small fat containing left spigelian hernia just inferior to the level of the umbilicus with an abdominal defect of 0.9 cm. Possible tiny fat containing left inguinal hernia. These results were called by telephone at the time of interpretation on 04/25/2021 at 11:57 pm to provider Dr. Elesa Massed, who verbally acknowledged these results. Electronically Signed   By: Tish Frederickson M.D.   On: 04/26/2021 00:03    Scheduled Meds:  celecoxib  200 mg Oral BID   Chlorhexidine Gluconate Cloth  6 each Topical Daily   [START ON 04/27/2021] enoxaparin (LOVENOX) injection  0.5 mg/kg Subcutaneous Q24H   gabapentin  300 mg Oral BID   insulin aspart  0-15 Units Subcutaneous TID WC   pantoprazole (PROTONIX) IV  40 mg Intravenous QHS   Continuous Infusions:  sodium chloride 125 mL/hr at 04/26/21 0906   piperacillin-tazobactam     piperacillin-tazobactam (ZOSYN)  IV 3.375 g (04/26/21 1407)   PRN Meds: hydrALAZINE, HYDROcodone-acetaminophen, morphine injection, ondansetron **OR**  ondansetron (ZOFRAN) IV  Time spent: 35 minutes  Author: Gillis SantaILEEP Emmeline Winebarger. MD [facility] 04/26/2021 2:55 PM  To reach On-call, see care teams to locate the attending and reach out to them via [email or web address]. If 7PM-7AM, please contact night-coverage If you still have difficulty reaching the attending provider, please page the [facility] (Director on Call) for [facility] on  amion for assistance.

## 2021-04-26 NOTE — Op Note (Signed)
Preoperative diagnosis: Perforation of intestine.                                           Multiple incisional hernias  Postoperative diagnosis: Diverticular disease with perforation.                                             Multiple incisional hernias                                                Procedure:  Exploratory Laparotomy                      Sigmoid colectomy with anastomosis                      Creation of loop ileostomy                      Appendectomy                      Incisional hernia repair, incarcerated, 4 cm  Anesthesia: GETA  Surgeon: Dr. Hazle Quant, MD  Wound Classification: Dirty  Indications:  Patient is a 56 y.o. male with abdominal pain since last night was found to have an acute perforation of the intestine. Emergent exploratory laparotomy was indicated.  Description of procedure:  The patient was placed in the supine position and general endotracheal anesthesia was induced. A time-out was completed verifying correct patient, procedure, site, positioning, and implant(s) and/or special equipment prior to beginning this procedure. Preoperative antibiotics were given. A Foley catheter and nasogastric tube were placed. The abdomen was prepped and draped in the usual sterile fashion. A vertical midline incision was made from xiphoid to just above the pubis. This was deepened through the subcutaneous tissues and hemostasis was achieved with electrocautery. The linea alba was identified and incised and the peritoneal cavity entered. The abdomen was explored.  Abundant amount of adhesions were lysed sharply under direct vision with Metzenbaum scissors.  These adhesions were dense and abundant being and time-consuming portion of the surgery.  Generalized purulent and fecal peritonitis with abundant amount of free purulence small amount of feculent material throughout the abdomen was found.  After complete lysis of adhesion, the small bowel was inspected from Treitz  to ileocecal valve.  There was abundant purulence in the right lower quadrant.  The appendix was inspected and found to be irritated.  At this time was started to perform appendectomy.  The mesoappendix was divided with Enseal device.  The base of the appendix was ligated with 2-0 silk twice.  Appendix divided with scissors.  Then, the small bowel was retracted to the right using a moist towel and self-retaining retractor. Using electrocautery, the colon was freed from its peritoneal attachments along the line of Toldt proximally from the splenic flexure and distally to the pelvic inlet. Both ureters were identified and protected.  Points of transection were selected proximally and distally. The bowel was divided with the linear cutting stapler. The peritoneum overlying the mesentery was then scored with electrocautery and  the left colic artery was identified, double ligated with 2-0 silk sutures, and transected. The peritoneum overlying the mesentery was scored and remaining mesentery divided with Enseal device.  The specimen was removed.  Then it was decided to proceed with cancer on) colocolonic anastomosis.  This was done in a 2 layer fashion.  The outer layer was started on the posterior wall with 2-0 V-Loc.  Then the inner layer was done with a running suture of 2-0 V-Loc circumferentially.  Then the outer second layer was completed in a Lembert fashion with the remaining 2-0 V-Loc. The abdominal cavity was then copiously irrigated and hemostasis was checked.  And loop of ileum was decided to be exteriorized as a protective loop ileostomy. A disk of skin was removed from the ostomy site in the right lower quadrant. The incision was deepened through all layers of the abdominal wall and dilated to admit two fingers. The loop of ileum was passed out through the ostomy site without torsion or tension.    A closed suction drain was placed in the pelvis and brought out through separate stab wounds lateral to the  incision. These were secured with 2-0 nylon. There was an incarcerated incisional hernia from a left lower quadrant incision.  This incisional hernia measured 2 cm.  Incarcerated fat was reduced.  The anterior fascia was closed with 2-0 PDS.  There was also a midline incisional hernia that measured 2.1 cm.  The hernia sac was able to be reduced with the incarcerated fat content.  Hernia sac was cleared from the anterior fascia.  This hernia was repaired with 0 PDS. Then the midline fascia was closed with a running suture of PDS 0. The skin was closed with skin staples.  The loop ileostomy was matured with multiple interrupted sutures of 3-0 Vicryl. An ostomy bag was applied.  The patient tolerated the procedure well and was taken to the postanesthesia care unit in stable condition.   Specimen: Sigmoid colon                     Appendix  Complications: None  EBL: 300 mL

## 2021-04-26 NOTE — Transfer of Care (Signed)
Immediate Anesthesia Transfer of Care Note  Patient: Timothy Cameron  Procedure(s) Performed: APPENDECTOMY (Abdomen) partial colectomy with anastomosis (Abdomen) ILEOSTOMY Creation (Abdomen) HERNIA REPAIR INCISIONAL (Abdomen)  Patient Location: PACU  Anesthesia Type:General  Level of Consciousness: sedated  Airway & Oxygen Therapy: Patient Spontanous Breathing and Patient connected to face mask oxygen  Post-op Assessment: Report given to RN and Post -op Vital signs reviewed and stable  Post vital signs: Reviewed and stable  Last Vitals:  Vitals Value Taken Time  BP    Temp    Pulse    Resp    SpO2      Last Pain:  Vitals:   04/26/21 0057  TempSrc:   PainSc: 10-Worst pain ever         Complications: No notable events documented.

## 2021-04-26 NOTE — H&P (Signed)
SURGICAL HISTORY AND PHYSICAL NOTE   HISTORY OF PRESENT ILLNESS (HPI):  56 y.o. male presented to [facility] ED for evaluation of abdominal pain. Patient reports he started having abdominal pain since yesterday at 8 PM.  He said that he started having acute onset of severe generalized abdominal pain.  Pain is radiating more to the right side of the abdomen.  Patient cannot identify any alleviating factors.  There is no aggravating factor.  At the ED he was found with leukocytosis.  There is no fever but blood pressure of 94/68 upon arrival.  With IV fluid resuscitation blood pressure now 134/91.  He had a CT scan of the abdomen and pelvis that shows free air and free fluid.  I personally evaluated the images.  Difficult to assess specifically where the perforation is coming from.  As per history patient with previous intra-abdominal surgery for small bowel obstruction without previous other abdominal surgery.  No bowel resection done at the end surgery as per patient.  Patient does not recall when was the last colonoscopy but it was definitely more than 5 years ago.  CT scan shows diverticulosis.  Surgery is consulted by Dr. Alfred Levins in this context for evaluation and management of bowel perforation.  PAST MEDICAL HISTORY (PMH):  Past Medical History:  Diagnosis Date   Diabetes ([place])    High cholesterol    Hypertension      PAST SURGICAL HISTORY ([place]):  Past Surgical History:  Procedure Laterality Date   ABDOMINAL SURGERY     COLONOSCOPY N/A 03/09/2016   Procedure: COLONOSCOPY;  Surgeon: Wilford Corner, MD;  Location: [facility] ENDOSCOPY;  Service: Endoscopy;  Laterality: N/A;     MEDICATIONS:  Prior to Admission medications   Medication Sig Start Date End Date Taking? Authorizing Provider  albuterol (PROVENTIL) (2.5 MG/3ML) 0.083% nebulizer solution Take 3 mLs (2.5 mg total) by nebulization every 6 (six) hours as needed for wheezing or shortness of breath. 11/09/17   Laban Emperor, PA-C   atorvastatin (LIPITOR) 20 MG tablet Take 20 mg by mouth at bedtime.    [provider]  azithromycin (ZITHROMAX Z-PAK) 250 MG tablet 1 tablet (250 mg) once daily on Days 2 through 5. 11/21/18   Earleen Newport, MD  glipiZIDE (GLUCOTROL XL) 10 MG 24 hr tablet Take 1 tablet (10 mg total) by mouth at bedtime. 06/20/18   Harvest Dark, MD  HYDROcodone-acetaminophen (NORCO/VICODIN) 5-325 MG tablet Take 1 tablet by mouth every 6 (six) hours as needed for moderate pain. 09/28/17   Johnn Hai, PA-C  ibuprofen (ADVIL) 800 MG tablet Take 1 tablet (800 mg total) by mouth every 8 (eight) hours as needed. 11/23/18   Lilia Pro., MD  ketorolac (TORADOL) 10 MG tablet Take 1 tablet (10 mg total) by mouth every 6 (six) hours as needed for moderate pain. 09/28/17   Johnn Hai, PA-C  losartan (COZAAR) 25 MG tablet Take 1 tablet (25 mg total) by mouth daily. 06/20/18   Harvest Dark, MD  methocarbamol (ROBAXIN) 500 MG tablet Take 1 tablet (500 mg total) by mouth 4 (four) times daily. 09/28/17   Johnn Hai, PA-C  predniSONE (DELTASONE) 10 MG tablet Take 6 tablets on day 1, take 5 tablets on day 2, take 4 tablets on day 3, take 3 tablets on day 4, take 2 tablets on day 5, take 1 tablet on day 6 11/09/17   Laban Emperor, PA-C  predniSONE (DELTASONE) 50 MG tablet Take 1 tablet by mouth daily 11/21/18  Earleen Newport, MD  psyllium (METAMUCIL) 58.6 % packet Take 1 packet by mouth at bedtime.    [provider]     ALLERGIES:  No Known Allergies   SOCIAL HISTORY:  Social History   Socioeconomic History   Marital status: Single    Spouse name: Not on file   Number of children: Not on file   Years of education: Not on file   Highest education level: Not on file  Occupational History   Not on file  Tobacco Use   Smoking status: Every Day    Types: Cigars    Last attempt to quit: 10/23/2017    Years since quitting: 3.5   Smokeless tobacco: Never  Vaping Use    Vaping Use: Never used  Substance and Sexual Activity   Alcohol use: Yes    Alcohol/week: 30.0 standard drinks    Types: 30 Cans of beer per week   Drug use: Yes    Types: Cocaine, Marijuana   Sexual activity: Not on file  Other Topics Concern   Not on file  Social History Narrative   Not on file   Social Determinants of Health   Financial Resource Strain: Not on file  Food Insecurity: Not on file  Transportation Needs: Not on file  Physical Activity: Not on file  Stress: Not on file  Social Connections: Not on file  Intimate Partner Violence: Not on file      FAMILY HISTORY:  History reviewed. No pertinent family history.   REVIEW OF SYSTEMS:  Constitutional: denies weight loss, fever, chills, or sweats  Eyes: denies any other vision changes, history of eye injury  ENT: denies sore throat, hearing problems  Respiratory: denies shortness of breath, wheezing  Cardiovascular: denies chest pain, palpitations  Gastrointestinal: Positive abdominal pain, nausea and vomiting Genitourinary: denies burning with urination or urinary frequency Musculoskeletal: denies any other joint pains or cramps  Skin: denies any other rashes or skin discolorations  Neurological: denies any other headache, dizziness, weakness  Psychiatric: denies any other depression, anxiety   All other review of systems were negative   VITAL SIGNS:  Temp:  [97.8 F (36.6 C)] 97.8 F (36.6 C) (01/04 2219) Pulse Rate:  [83-92] 92 (01/05 0100) Resp:  [18-31] 31 (01/05 0100) BP: (94-137)/(68-91) 137/91 (01/05 0100) SpO2:  [93 %-98 %] 97 % (01/05 0100) Weight:  [99.8 kg] 99.8 kg (01/04 2222)     Height: 5\' 11"  (180.3 cm) Weight: 99.8 kg BMI (Calculated): 30.7   INTAKE/OUTPUT:  This shift: No intake/output data recorded.  Last 2 shifts: @IOLAST2SHIFTS @   PHYSICAL EXAM:  Constitutional:  -- Normal body habitus  -- Awake, alert, and oriented x3  Eyes:  -- Pupils equally round and reactive to light  --  No scleral icterus  Ear, nose, and throat:  -- No jugular venous distension  Pulmonary:  -- No crackles  -- Equal breath sounds bilaterally -- Breathing non-labored at rest Cardiovascular:  -- S1, S2 present  -- No pericardial rubs Gastrointestinal:  -- Abdomen soft, very tender, distended, with guarding and rebound tenderness.  There is a umbilical hernia. -- No abdominal masses appreciated, pulsatile or otherwise  Musculoskeletal and Integumentary:  -- Wounds: None appreciated -- Extremities: B/L UE and LE FROM, hands and feet warm, no edema  Neurologic:  -- Motor function: intact and symmetric -- Sensation: intact and symmetric   Labs:  CBC Latest Ref Rng & Units 04/25/2021 11/20/2018 06/20/2018  WBC 4.0 - 10.5 K/uL 18.5(H)  8.0 10.6(H)  Hemoglobin 13.0 - 17.0 g/dL 18.6(H) 16.3 16.8  Hematocrit 39.0 - 52.0 % 52.2(H) 45.9 46.8  Platelets 150 - 400 K/uL 216 159 183   CMP Latest Ref Rng & Units 04/25/2021 11/20/2018 06/20/2018  Glucose 70 - 99 mg/dL 280(H) 209(H) 250(H)  BUN 6 - 20 mg/dL 16 14 20   Creatinine 0.61 - 1.24 mg/dL 1.28(H) 1.04 0.89  Sodium 135 - 145 mmol/L 133(L) 138 137  Potassium 3.5 - 5.1 mmol/L 3.8 3.7 3.5  Chloride 98 - 111 mmol/L 100 104 103  CO2 22 - 32 mmol/L 21(L) 24 25  Calcium 8.9 - 10.3 mg/dL 9.0 9.1 9.2  Total Protein 6.5 - 8.1 g/dL 7.4 - 7.4  Total Bilirubin 0.3 - 1.2 mg/dL 1.6(H) - 1.0  Alkaline Phos 38 - 126 U/L 51 - 47  AST 15 - 41 U/L 32 - 50(H)  ALT 0 - 44 U/L 28 - 61(H)    Imaging studies:  EXAM: CT ABDOMEN AND PELVIS WITH CONTRAST   TECHNIQUE: Multidetector CT imaging of the abdomen and pelvis was performed using the standard protocol following bolus administration of intravenous contrast.   CONTRAST:  123mL OMNIPAQUE IOHEXOL 300 MG/ML  SOLN   COMPARISON:  None.   FINDINGS: Lower chest: No acute abnormality.   Hepatobiliary: No focal liver abnormality. No gallstones, gallbladder wall thickening, or pericholecystic fluid. No  biliary dilatation.   Pancreas: No focal lesion. Normal pancreatic contour. No surrounding inflammatory changes. No main pancreatic ductal dilatation.   Spleen: Normal in size without focal abnormality.   Adrenals/Urinary Tract:   No adrenal nodule bilaterally.   Bilateral kidneys enhance symmetrically.   No hydronephrosis. No hydroureter.   The urinary bladder is unremarkable.   Stomach/Bowel: Stomach is within normal limits. No evidence of bowel wall thickening or dilatation. No pneumatosis. Scattered colonic diverticulosis. Suggestion of appendectomy.   Vascular/Lymphatic: No definite portal venous or mesenteric venous gas. The main portal, splenic, superior mesenteric veins are patent. No abdominal aorta or iliac aneurysm. Mild atherosclerotic plaque of the aorta and its branches. No abdominal, pelvic, or inguinal lymphadenopathy.   Reproductive: Prostate is unremarkable.   Other: Small volume free fluid and small volume pneumoperitoneum. Query origin of pneumoperitoneum and free fluid from a perforation involving the small bowel or rectum (5:43). No organized fluid collection.   Musculoskeletal:   Small fat containing umbilical hernia with an abdominal defect of 2.1 cm. Small fat containing left spigelian hernia just inferior to the level of the umbilicus with an abdominal defect of 0.9 cm. Possible tiny fat containing left inguinal hernia.   No suspicious lytic or blastic osseous lesions. No acute displaced fracture. Multilevel degenerative changes of the spine.   IMPRESSION: 1. Small volume pneumoperitoneum and small volume ascites likely originating from a bowel perforation. Likely origin of perforation includes the small bowel or rectum in the anterior lower abdomen/pelvis. 2. Scattered colonic diverticulosis with no acute diverticulitis. 3. Small fat containing umbilical hernia with an abdominal defect of 2.1 cm. Small fat containing left spigelian hernia  just inferior to the level of the umbilicus with an abdominal defect of 0.9 cm. Possible tiny fat containing left inguinal hernia.   These results were called by telephone at the time of interpretation on 04/25/2021 at 11:57 pm to provider Dr. Leonides Schanz, who verbally acknowledged these results.     Electronically Signed   By: Iven Finn M.D.   On: 04/26/2021 00:03  Assessment/Plan:  56 y.o. male with intestinal perforation, complicated by  pertinent comorbidities including uncontrolled diabetes, acute kidney injury, hypertension, hyperlipidemia.  Patient with a history, physical exam and imaging consistent with intestinal perforation.  From the CT scan it is impossible to say where the perforation is coming from.  I discussed with patient possibility of being perforated diverticulitis or perforated small bowel.  I discussed with patient that if it is perforated diverticulitis he will need a partial colectomy with creation of a temporary colostomy.  If the small bowel perforation he may not need an ostomy if anastomosis can be done safely.  I discussed with patient risk of bleeding, infection, injury to adjacent organ, needing of subsequent surgeries, among others.  The patient reports understood and agreed to proceed with emergent exploratory laparotomy.   Arnold Long, MD

## 2021-04-26 NOTE — Anesthesia Procedure Notes (Signed)
Procedure Name: Intubation Date/Time: 04/26/2021 2:23 AM Performed by: Lendon Colonel, CRNA Pre-anesthesia Checklist: Patient identified, Patient being monitored, Timeout performed, Emergency Drugs available and Suction available Patient Re-evaluated:Patient Re-evaluated prior to induction Oxygen Delivery Method: Circle system utilized Preoxygenation: Pre-oxygenation with 100% oxygen Induction Type: IV induction and Rapid sequence Laryngoscope Size: 3 and McGraph Grade View: Grade I Tube type: Oral Tube size: 7.0 mm Number of attempts: 1 Airway Equipment and Method: Stylet Placement Confirmation: ETT inserted through vocal cords under direct vision, positive ETCO2 and breath sounds checked- equal and bilateral Secured at: 21 cm Tube secured with: Tape Dental Injury: Teeth and Oropharynx as per pre-operative assessment

## 2021-04-26 NOTE — Consult Note (Addendum)
WOC consult requested for new ileostomy which was performed today; our team will perform first post-op pouch change and teaching session tomorrow as requested. We will attempt to contact family members to determine time of teaching session on Fri. Cammie Mcgee MSN, RN, CWOCN, [place], CNS [phone]

## 2021-04-26 NOTE — Consult Note (Signed)
PHARMACY - PHYSICIAN COMMUNICATION CRITICAL VALUE ALERT - BLOOD CULTURE IDENTIFICATION (BCID)  Timothy Cameron is an 56 y.o. male who presented to [facility] on 04/26/2021 with a chief complaint of abdominal pain  Assessment:  1 out of 4 staph epi mecA/C (include suspected source if known)  Name of physician (or Provider) Contacted: [place]  Current antibiotics: pip/tazo   Changes to prescribed antibiotics recommended:  MD does not want to start vancomycin at this time. Continue with current abx.   Results for orders placed or performed during the hospital encounter of 04/26/21  Blood Culture ID Panel (Reflexed) (Collected: 04/26/2021 12:40 AM)  Result Value Ref Range   Enterococcus faecalis NOT DETECTED NOT DETECTED   Enterococcus Faecium NOT DETECTED NOT DETECTED   Listeria monocytogenes NOT DETECTED NOT DETECTED   Staphylococcus species DETECTED (A) NOT DETECTED   Staphylococcus aureus (BCID) NOT DETECTED NOT DETECTED   Staphylococcus epidermidis DETECTED (A) NOT DETECTED   Staphylococcus lugdunensis NOT DETECTED NOT DETECTED   Streptococcus species NOT DETECTED NOT DETECTED   Streptococcus agalactiae NOT DETECTED NOT DETECTED   Streptococcus pneumoniae NOT DETECTED NOT DETECTED   Streptococcus pyogenes NOT DETECTED NOT DETECTED   A.calcoaceticus-baumannii NOT DETECTED NOT DETECTED   Bacteroides fragilis NOT DETECTED NOT DETECTED   Enterobacterales NOT DETECTED NOT DETECTED   Enterobacter cloacae complex NOT DETECTED NOT DETECTED   Escherichia coli NOT DETECTED NOT DETECTED   Klebsiella aerogenes NOT DETECTED NOT DETECTED   Klebsiella oxytoca NOT DETECTED NOT DETECTED   Klebsiella pneumoniae NOT DETECTED NOT DETECTED   Proteus species NOT DETECTED NOT DETECTED   Salmonella species NOT DETECTED NOT DETECTED   Serratia marcescens NOT DETECTED NOT DETECTED   Haemophilus influenzae NOT DETECTED NOT DETECTED   Neisseria meningitidis NOT DETECTED NOT DETECTED   Pseudomonas  aeruginosa NOT DETECTED NOT DETECTED   Stenotrophomonas maltophilia NOT DETECTED NOT DETECTED   Candida albicans NOT DETECTED NOT DETECTED   Candida auris NOT DETECTED NOT DETECTED   Candida glabrata NOT DETECTED NOT DETECTED   Candida krusei NOT DETECTED NOT DETECTED   Candida parapsilosis NOT DETECTED NOT DETECTED   Candida tropicalis NOT DETECTED NOT DETECTED   Cryptococcus neoformans/gattii NOT DETECTED NOT DETECTED   Methicillin resistance mecA/C DETECTED (A) NOT DETECTED    [name] 04/26/2021  9:37 PM

## 2021-04-26 NOTE — Progress Notes (Signed)
Inpatient Diabetes Program Recommendations  AACE/ADA: New Consensus Statement on Inpatient Glycemic Control (2015)  Target Ranges:  Prepandial:   less than 140 mg/dL      Peak postprandial:   less than 180 mg/dL (1-2 hours)      Critically ill patients:  140 - 180 mg/dL   Lab Results  Component Value Date   GLUCAP 285 (H) 04/26/2021   HGBA1C 7.3 (H) 03/09/2016    Review of Glycemic Control  Latest Reference Range & Units 04/26/21 06:45 04/26/21 08:57  Glucose-Capillary 70 - 99 mg/dL 641 (H) 583 (H)  (H): Data is abnormally high  Diabetes history: DM2 Outpatient Diabetes medications: Glipizide 10 QD Current orders for Inpatient glycemic control: Novolog 0-15 units TID  Inpatient Diabetes Program Recommendations:    Novolog 0-15 units Q4H while NPO  Will continue to follow while inpatient.  Thank you, Dulce Sellar, MSN, RN Diabetes Coordinator Inpatient Diabetes Program [phone] (team pager from 8a-5p)

## 2021-04-26 NOTE — Anesthesia Preprocedure Evaluation (Signed)
Anesthesia Evaluation  Patient identified by MRN, date of birth, ID band Patient awake    Reviewed: Allergy & Precautions, NPO status , Patient's Chart, lab work & pertinent test results  Airway Mallampati: II  TM Distance: >3 FB     Dental  (+) Chipped   Pulmonary shortness of breath, neg sleep apnea, neg COPD, neg recent URI, Current Smoker,    Pulmonary exam normal        Cardiovascular Exercise Tolerance: Good hypertension, (-) angina(-) Past MI and (-) Cardiac Stents Normal cardiovascular exam(-) dysrhythmias (-) Valvular Problems/Murmurs     Neuro/Psych negative neurological ROS  negative psych ROS   GI/Hepatic (+)     substance abuse (history of cocaine abuse, last UDS was positive)  cocaine use, Lower GI bleed   Endo/Other  diabetes  Renal/GU negative Renal ROS  negative genitourinary   Musculoskeletal negative musculoskeletal ROS (+)   Abdominal Normal abdominal exam  (+)   Peds negative pediatric ROS (+)  Hematology negative hematology ROS (+)   Anesthesia Other Findings Past Medical History: No date: Diabetes (HCC) No date: High cholesterol No date: Hypertension  Patient last ate at 10pm and has a history of cocaine abuse, but the surgeon is declaring it an emergency for bowel perforation so we will proceed at this time.  Reproductive/Obstetrics                             Anesthesia Physical  Anesthesia Plan  ASA: 2 and emergent  Anesthesia Plan: General   Post-op Pain Management:    Induction: Intravenous, Rapid sequence and Cricoid pressure planned  PONV Risk Score and Plan: 1  Airway Management Planned: Oral ETT  Additional Equipment:   Intra-op Plan:   Post-operative Plan: Extubation in OR  Informed Consent: I have reviewed the patients History and Physical, chart, labs and discussed the procedure including the risks, benefits and alternatives for the  proposed anesthesia with the patient or authorized representative who has indicated his/her understanding and acceptance.     Dental advisory given  Plan Discussed with: CRNA and Surgeon  Anesthesia Plan Comments:         Anesthesia Quick Evaluation

## 2021-04-26 NOTE — Consult Note (Signed)
Medical consult   Timothy Cameron ZOX:[id number] DOB: [birth year] DOA: 04/26/2021   Reason for consult: management of medical problems   Requesting physician: Dr Hazle Quantintron-Diaz  HPI: Timothy Cameron is a 56 y.o. male with medical history significant DM, HTN, HLD, admitted to the surgical service for laparotomy secondary to diverticulitis with perforation.  Patient is being taken to the OR at the time of this consultation and denies complaints outside of presenting symptoms of right-sided lower quadrant pain and nausea.  He denies a history of CAD, CHF, A. fib, syncope, seizures, stroke, COPD or OSA.  He denies cough or shortness of breath and at baseline is good exercise tolerance.  Review of Systems: As per HPI otherwise all other systems on review of systems negative.   Assessment/Plan    Bowel perforation (HCC) -Management per surgery    Diabetes (HCC) - Sliding scale insulin coverage    HTN (hypertension) - Hydralazine IV as needed while NPO    HLD (hyperlipidemia) - Hold meds until oral route established      Past Medical History:  Diagnosis Date   Diabetes (HCC)    High cholesterol    Hypertension     Past Surgical History:  Procedure Laterality Date   ABDOMINAL SURGERY     COLONOSCOPY N/A 03/09/2016   Procedure: COLONOSCOPY;  Surgeon: Charlott RakesVincent Schooler, MD;  [facility];  Service: Endoscopy;  Laterality: N/A;     reports that he has been smoking cigars. He has never used smokeless tobacco. He reports current alcohol use of about 30.0 standard drinks per week. He reports current drug use. Drugs: Cocaine and Marijuana.  No Known Allergies  History reviewed. No pertinent family history.    Prior to Admission medications   Medication Sig Start Date End Date Taking? Authorizing Provider  albuterol (PROVENTIL) (2.5 MG/3ML) 0.083% nebulizer solution Take 3 mLs (2.5 mg total) by nebulization every 6 (six) hours as needed for wheezing or shortness of  breath. 11/09/17   Enid DerryWagner, Ashley, PA-C  atorvastatin (LIPITOR) 20 MG tablet Take 20 mg by mouth at bedtime.    [provider]  azithromycin (ZITHROMAX Z-PAK) 250 MG tablet 1 tablet (250 mg) once daily on Days 2 through 5. 11/21/18   Emily FilbertWilliams, Jonathan E, MD  glipiZIDE (GLUCOTROL XL) 10 MG 24 hr tablet Take 1 tablet (10 mg total) by mouth at bedtime. 06/20/18   Minna AntisPaduchowski, Kevin, MD  HYDROcodone-acetaminophen (NORCO/VICODIN) 5-325 MG tablet Take 1 tablet by mouth every 6 (six) hours as needed for moderate pain. 09/28/17   Tommi RumpsSummers, Rhonda L, PA-C  ibuprofen (ADVIL) 800 MG tablet Take 1 tablet (800 mg total) by mouth every 8 (eight) hours as needed. 11/23/18   Miguel AschoffMonks, Sarah L., MD  ketorolac (TORADOL) 10 MG tablet Take 1 tablet (10 mg total) by mouth every 6 (six) hours as needed for moderate pain. 09/28/17   Tommi RumpsSummers, Rhonda L, PA-C  losartan (COZAAR) 25 MG tablet Take 1 tablet (25 mg total) by mouth daily. 06/20/18   Minna AntisPaduchowski, Kevin, MD  methocarbamol (ROBAXIN) 500 MG tablet Take 1 tablet (500 mg total) by mouth 4 (four) times daily. 09/28/17   Tommi RumpsSummers, Rhonda L, PA-C  predniSONE (DELTASONE) 10 MG tablet Take 6 tablets on day 1, take 5 tablets on day 2, take 4 tablets on day 3, take 3 tablets on day 4, take 2 tablets on day 5, take 1 tablet on day 6 11/09/17   Enid DerryWagner, Ashley, PA-C  predniSONE (DELTASONE) 50 MG tablet Take 1  tablet by mouth daily 11/21/18   Emily Filbert, MD  psyllium (METAMUCIL) 58.6 % packet Take 1 packet by mouth at bedtime.    [provider]    Physical Exam: Vitals:   04/25/21 2222 04/26/21 0035 04/26/21 0100 04/26/21 0130  BP:  132/89 (!) 137/91 128/84  Pulse:  89 92 94  Resp:  18 (!) 31 (!) 29  Temp:      TempSrc:      SpO2:  98% 97% 94%  Weight: 99.8 kg     Height: 5\' 11"  (1.803 m)      Constitutional: Alert and oriented x 3 . Not in any apparent distress HEENT:      Head: Normocephalic and atraumatic.         Eyes: PERLA, EOMI, Conjunctivae are  normal. Sclera is non-icteric.       Mouth/Throat: Mucous membranes are moist.       Neck: Supple with no signs of meningismus. Cardiovascular: Regular rate and rhythm. No murmurs, gallops, or rubs. 2+ symmetrical distal pulses are present . No JVD. No  LE edema Respiratory: Respiratory effort normal .Lungs sounds clear bilaterally. No wheezes, crackles, or rhonchi.  Gastrointestinal: Deferred to surgeon Genitourinary: No CVA tenderness. Musculoskeletal: Nontender with normal range of motion in all extremities. No cyanosis, or erythema of extremities. Neurologic:  Face is symmetric. Moving all extremities. No gross focal neurologic deficits . Skin: Skin is warm, dry.  No rash or ulcers Psychiatric: Mood and affect are appropriate    Labs on Admission: I have personally reviewed following labs and imaging studies  CBC: Recent Labs  Lab 04/25/21 2232  WBC 18.5*  NEUTROABS 15.8*  HGB 18.6*  HCT 52.2*  MCV 91.3  PLT 216   Basic Metabolic Panel: Recent Labs  Lab 04/25/21 2232  NA 133*  K 3.8  CL 100  CO2 21*  GLUCOSE 280*  BUN 16  CREATININE 1.28*  CALCIUM 9.0   GFR: Estimated Creatinine Clearance: 78.5 mL/min (A) (by C-G formula based on SCr of 1.28 mg/dL (H)). Liver Function Tests: Recent Labs  Lab 04/25/21 2232  AST 32  ALT 28  ALKPHOS 51  BILITOT 1.6*  PROT 7.4  ALBUMIN 4.3   Recent Labs  Lab 04/25/21 2232  LIPASE 29   No results for input(s): AMMONIA in the last 168 hours. Coagulation Profile: No results for input(s): INR, PROTIME in the last 168 hours. Cardiac Enzymes: No results for input(s): CKTOTAL, CKMB, CKMBINDEX, TROPONINI in the last 168 hours. BNP (last 3 results) No results for input(s): PROBNP in the last 8760 hours. HbA1C: No results for input(s): HGBA1C in the last 72 hours. CBG: No results for input(s): GLUCAP in the last 168 hours. Lipid Profile: No results for input(s): CHOL, HDL, LDLCALC, TRIG, CHOLHDL, LDLDIRECT in the last 72  hours. Thyroid Function Tests: No results for input(s): TSH, T4TOTAL, FREET4, T3FREE, THYROIDAB in the last 72 hours. Anemia Panel: No results for input(s): VITAMINB12, FOLATE, FERRITIN, TIBC, IRON, RETICCTPCT in the last 72 hours. Urine analysis:    Component Value Date/Time   COLORURINE YELLOW (A) 01/04/2018 1400   APPEARANCEUR CLEAR (A) 01/04/2018 1400   APPEARANCEUR Hazy 11/25/2012 1712   LABSPEC 1.023 01/04/2018 1400   LABSPEC 1.026 11/25/2012 1712   PHURINE 5.0 01/04/2018 1400   GLUCOSEU NEGATIVE 01/04/2018 1400   GLUCOSEU Negative 11/25/2012 1712   HGBUR NEGATIVE 01/04/2018 1400   BILIRUBINUR NEGATIVE 01/04/2018 1400   BILIRUBINUR Negative 11/25/2012 1712   KETONESUR NEGATIVE 01/04/2018  1400   PROTEINUR NEGATIVE 01/04/2018 1400   NITRITE NEGATIVE 01/04/2018 1400   LEUKOCYTESUR NEGATIVE 01/04/2018 1400   LEUKOCYTESUR Negative 11/25/2012 1712    Radiological Exams on Admission: CT ABDOMEN PELVIS W CONTRAST  Result Date: 04/26/2021 CLINICAL DATA:  RLQ abdominal pain (Age >= 14y) EXAM: CT ABDOMEN AND PELVIS WITH CONTRAST TECHNIQUE: Multidetector CT imaging of the abdomen and pelvis was performed using the standard protocol following bolus administration of intravenous contrast. CONTRAST:  OMNIPAQUE IOHEXOL 300 MG/ML  SOLN COMPARISON:  None. FINDINGS: Lower chest: No acute abnormality. Hepatobiliary: No focal liver abnormality. No gallstones, gallbladder wall thickening, or pericholecystic fluid. No biliary dilatation. Pancreas: No focal lesion. Normal pancreatic contour. No surrounding inflammatory changes. No main pancreatic ductal dilatation. Spleen: Normal in size without focal abnormality. Adrenals/Urinary Tract: No adrenal nodule bilaterally. Bilateral kidneys enhance symmetrically. No hydronephrosis. No hydroureter. The urinary bladder is unremarkable. Stomach/Bowel: Stomach is within normal limits. No evidence of bowel wall thickening or dilatation. No pneumatosis.  Scattered colonic diverticulosis. Suggestion of appendectomy. Vascular/Lymphatic: No definite portal venous or mesenteric venous gas. The main portal, splenic, superior mesenteric veins are patent. No abdominal aorta or iliac aneurysm. Mild atherosclerotic plaque of the aorta and its branches. No abdominal, pelvic, or inguinal lymphadenopathy. Reproductive: Prostate is unremarkable. Other: Small volume free fluid and small volume pneumoperitoneum. Query origin of pneumoperitoneum and free fluid from a perforation involving the small bowel or rectum (5:43). No organized fluid collection. Musculoskeletal: Small fat containing umbilical hernia with an abdominal defect of 2.1 cm. Small fat containing left spigelian hernia just inferior to the level of the umbilicus with an abdominal defect of 0.9 cm. Possible tiny fat containing left inguinal hernia. No suspicious lytic or blastic osseous lesions. No acute displaced fracture. Multilevel degenerative changes of the spine. IMPRESSION: 1. Small volume pneumoperitoneum and small volume ascites likely originating from a bowel perforation. Likely origin of perforation includes the small bowel or rectum in the anterior lower abdomen/pelvis. 2. Scattered colonic diverticulosis with no acute diverticulitis. 3. Small fat containing umbilical hernia with an abdominal defect of 2.1 cm. Small fat containing left spigelian hernia just inferior to the level of the umbilicus with an abdominal defect of 0.9 cm. Possible tiny fat containing left inguinal hernia. These results were called by telephone at the time of interpretation on 04/25/2021 at 11:57 pm to provider Dr. Elesa Massed, who verbally acknowledged these results. Electronically Signed   By: Tish Frederickson M.D.   On: 04/26/2021 00:03     Thank you for this consultation  Time spent:40 minutes  Andris Baumann MD [facility]   04/26/2021, 3:42 AM

## 2021-04-27 ENCOUNTER — Inpatient Hospital Stay: Payer: 59

## 2021-04-27 DIAGNOSIS — J1282 Pneumonia due to coronavirus disease 2019: Secondary | ICD-10-CM | POA: Diagnosis present

## 2021-04-27 DIAGNOSIS — K631 Perforation of intestine (nontraumatic): Secondary | ICD-10-CM | POA: Diagnosis not present

## 2021-04-27 DIAGNOSIS — J9811 Atelectasis: Secondary | ICD-10-CM | POA: Diagnosis not present

## 2021-04-27 DIAGNOSIS — U071 COVID-19: Secondary | ICD-10-CM | POA: Diagnosis present

## 2021-04-27 LAB — BASIC METABOLIC PANEL
Anion gap: 6 (ref 5–15)
BUN: 18 mg/dL (ref 6–20)
CO2: 24 mmol/L (ref 22–32)
Calcium: 8 mg/dL — ABNORMAL LOW (ref 8.9–10.3)
Chloride: 106 mmol/L (ref 98–111)
Creatinine, Ser: 0.8 mg/dL (ref 0.61–1.24)
GFR, Estimated: >60 mL/min (ref >60)
Glucose, Bld: 187 mg/dL — ABNORMAL HIGH (ref 70–99)
Potassium: 3.7 mmol/L (ref 3.5–5.1)
Sodium: 136 mmol/L (ref 135–145)

## 2021-04-27 LAB — MAGNESIUM: Magnesium: 2 mg/dL (ref 1.7–2.4)

## 2021-04-27 LAB — GLUCOSE, CAPILLARY
Glucose-Capillary: 186 mg/dL — ABNORMAL HIGH (ref 70–99)
Glucose-Capillary: 156 mg/dL — ABNORMAL HIGH (ref 70–99)
Glucose-Capillary: 134 mg/dL — ABNORMAL HIGH (ref 70–99)
Glucose-Capillary: 186 mg/dL — ABNORMAL HIGH (ref 70–99)

## 2021-04-27 LAB — CBC
HCT: 47.1 % (ref 39.0–52.0)
Hemoglobin: 16.1 g/dL (ref 13.0–17.0)
MCH: 31.6 pg (ref 26.0–34.0)
MCHC: 34.2 g/dL (ref 30.0–36.0)
MCV: 92.5 fL (ref 80.0–100.0)
Platelets: 139 10*3/uL — ABNORMAL LOW (ref 150–400)
RBC: 5.09 MIL/uL (ref 4.22–5.81)
RDW: 12.3 % (ref 11.5–15.5)
WBC: 14.5 10*3/uL — ABNORMAL HIGH (ref 4.0–10.5)
nRBC: 0 % (ref 0.0–0.2)

## 2021-04-27 LAB — LACTIC ACID, PLASMA
Lactic Acid, Venous: 1.2 mmol/L (ref 0.5–1.9)
Lactic Acid, Venous: 1.3 mmol/L (ref 0.5–1.9)

## 2021-04-27 LAB — PHOSPHORUS: Phosphorus: 2.5 mg/dL (ref 2.5–4.6)

## 2021-04-27 MED ORDER — SODIUM CHLORIDE 0.9 % IV SOLN
200.0000 mg | Freq: Once | INTRAVENOUS | Status: AC
  Administered 2021-04-27: 200 mg via INTRAVENOUS
  Filled 2021-04-27: qty 200

## 2021-04-27 MED ORDER — ALBUTEROL SULFATE HFA 108 (90 BASE) MCG/ACT IN AERS
2.0000 | INHALATION_SPRAY | Freq: Four times a day (QID) | RESPIRATORY_TRACT | Status: DC
  Administered 2021-04-27 – 2021-05-03 (×21): 2 via RESPIRATORY_TRACT
  Filled 2021-04-27: qty 6.7

## 2021-04-27 MED ORDER — SODIUM CHLORIDE 0.9 % IV SOLN
100.0000 mg | Freq: Every day | INTRAVENOUS | Status: AC
  Administered 2021-04-28 – 2021-05-01 (×4): 100 mg via INTRAVENOUS
  Filled 2021-04-27: qty 20
  Filled 2021-04-27: qty 100
  Filled 2021-04-27 (×2): qty 20

## 2021-04-27 MED ORDER — IPRATROPIUM BROMIDE HFA 17 MCG/ACT IN AERS
2.0000 | INHALATION_SPRAY | Freq: Four times a day (QID) | RESPIRATORY_TRACT | Status: DC
  Administered 2021-04-27 – 2021-05-03 (×21): 2 via RESPIRATORY_TRACT
  Filled 2021-04-27: qty 12.9

## 2021-04-27 MED ORDER — SODIUM CHLORIDE 3 % IN NEBU
4.0000 mL | INHALATION_SOLUTION | Freq: Every day | RESPIRATORY_TRACT | Status: AC
  Administered 2021-04-28 – 2021-04-29 (×2): 4 mL via RESPIRATORY_TRACT
  Filled 2021-04-27 (×3): qty 4

## 2021-04-27 MED ORDER — DEXAMETHASONE SODIUM PHOSPHATE 10 MG/ML IJ SOLN
6.0000 mg | INTRAMUSCULAR | Status: DC
  Administered 2021-04-27 – 2021-05-01 (×5): 6 mg via INTRAVENOUS
  Filled 2021-04-27 (×5): qty 1

## 2021-04-27 MED ORDER — SODIUM CHLORIDE 0.9 % IV SOLN
500.0000 mg | INTRAVENOUS | Status: DC
  Administered 2021-04-27 – 2021-04-28 (×2): 500 mg via INTRAVENOUS
  Filled 2021-04-27 (×2): qty 5
  Filled 2021-04-27: qty 500

## 2021-04-27 MED ORDER — KETOROLAC TROMETHAMINE 30 MG/ML IJ SOLN
30.0000 mg | Freq: Four times a day (QID) | INTRAMUSCULAR | Status: AC
  Administered 2021-04-27 – 2021-05-01 (×20): 30 mg via INTRAVENOUS
  Filled 2021-04-27 (×20): qty 1

## 2021-04-27 MED ORDER — GUAIFENESIN ER 600 MG PO TB12
600.0000 mg | ORAL_TABLET | Freq: Two times a day (BID) | ORAL | Status: DC
  Administered 2021-04-27 – 2021-05-03 (×13): 600 mg via ORAL
  Filled 2021-04-27 (×13): qty 1

## 2021-04-27 NOTE — Consult Note (Addendum)
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  Stoma type/location: RLQ ileostomy Stomal assessment/size: 1 1/2" pink budded just above skin level with a rod Peristomal assessment:  Intact Treatment options for stomal/peristomal skin: Barrier ring Output: thin serous drainage Ostomy pouching: 2pc. 2 1/4" placed a 2 pc 2 3/4" pouch today.  Education provided:  Explained role of ostomy nurse and creation of stoma  Explained stoma characteristics (budded, flush, color, texture, care) Demonstrated pouch change (cutting new skin barrier, measuring stoma, cleaning peristomal skin and stoma, use of barrier ring) Education on emptying when 1/3 to 1/2 full and how to empty Demonstrated "burping" flatus from pouch Demonstrated use of wick to clean spout  Discussed bathing, diet, gas, medication use, constipation, diarrhea, dehydration  Handout given "Eating with an Ostomy" ([employer] [place] www.ostomy.org (2022)    Discussed food blockage-handout given Discussed treatment of peristomal skin (ostomy powder, skin barrier wipes) Provided patient with [employer] Answered patient/family questions:   Will need pouch [employer] [place] # [phone]) Skin barrier [employer] [place] # [phone]) Barrier ring [employer] [place] # [phone])  5 sets of supplies left in room.  I recommend/discussed the [facility] outpatient ostomy clinic as an outpatient resource.  IF MD agrees this would be beneficial, please fax referral, or enter electronically in Epic the referral. (Fax- [phone])    WOC will follow again on Monday.  Enrolled patient in [employer] DC program: Yes  Welcome to [facility]! Thank you for choosing to enroll. A member of our team will be in touch shortly to explain our services, which include:  Personalized support from a Artist,  supplier, and insurance information Condition-specific information and connection to community resources If you have questions now, or in the future, please call us at [phone].    Renaldo Reel Katrinka Blazing, MSN, RN, CMSRN, Angus Seller, [facility] Pager [phone]

## 2021-04-27 NOTE — Progress Notes (Signed)
[employer] New onset hypoxia. Atelectasis vs PNA left lower lobe.  Currently requiring NRB to improve oxygen saturation.  Splinting breaths due to pain. Encouraged to work with day team to find pain regimen effective to control pain and increase mobility, incentive spirometer and flutter valve

## 2021-04-27 NOTE — TOC CM/SW Note (Signed)
Patient on airborne isolation. CSW left Ostomy Clinic info for patient, asked RN to deliver to bedside. Attempted to call into patient's room to discuss DC planning. No answer. Asked RN to have patient call me when able.  Alfonso Ramus, [place] [phone]

## 2021-04-27 NOTE — Progress Notes (Signed)
Patient ID: Timothy Cameron, male   DOB: 1965-05-27, 56 y.o.   MRN: [id number]     [facility] Day(s): 1.   Interval History: Patient seen and examined, no acute events or new complaints overnight. Patient reports having significant pain in surgical area.  Pain aggravated by coughing.  There is slight improvement of the pain.  Denies any nausea or vomiting.  Denies passing gas with ileostomy.  Vital signs in last 24 hours: [min-max] current  Temp:  [98.6 F (37 C)-99.7 F (37.6 C)] 98.6 F (37 C) (01/06 1504) Pulse Rate:  [80-111] 80 (01/06 1504) Resp:  [18-20] 18 (01/06 1504) BP: (122-159)/(73-92) 123/79 (01/06 1504) SpO2:  [90 %-99 %] 99 % (01/06 1504)     Height: 5\' 11"  (180.3 cm) Weight: 99.8 kg BMI (Calculated): 30.7   Physical Exam:  Constitutional: alert, cooperative and no distress  Respiratory: breathing non-labored at rest  Cardiovascular: regular rate and sinus rhythm  Gastrointestinal: soft, tender, and distended  Labs:  CBC Latest Ref Rng & Units 04/27/2021 04/25/2021 11/20/2018  WBC 4.0 - 10.5 K/uL 14.5(H) 18.5(H) 8.0  Hemoglobin 13.0 - 17.0 g/dL 16.1 18.6(H) 16.3  Hematocrit 39.0 - 52.0 % 47.1 52.2(H) 45.9  Platelets 150 - 400 K/uL 139(L) 216 159   CMP Latest Ref Rng & Units 04/27/2021 04/25/2021 11/20/2018  Glucose 70 - 99 mg/dL 187(H) 280(H) 209(H)  BUN 6 - 20 mg/dL 18 16 14   Creatinine 0.61 - 1.24 mg/dL 0.80 1.28(H) 1.04  Sodium 135 - 145 mmol/L 136 133(L) 138  Potassium 3.5 - 5.1 mmol/L 3.7 3.8 3.7  Chloride 98 - 111 mmol/L 106 100 104  CO2 22 - 32 mmol/L 24 21(L) 24  Calcium 8.9 - 10.3 mg/dL 8.0(L) 9.0 9.1  Total Protein 6.5 - 8.1 g/dL - 7.4 -  Total Bilirubin 0.3 - 1.2 mg/dL - 1.6(H) -  Alkaline Phos 38 - 126 U/L - 51 -  AST 15 - 41 U/L - 32 -  ALT 0 - 44 U/L - 28 -    Imaging studies: No new pertinent imaging studies   Assessment/Plan:  56 y.o. male with perforated diverticulitis 1 Day Post-Op s/p partial colectomy, appendectomy,  laparotomy, complicated by pertinent comorbidities including uncontrolled diabetes, hypertension,.  Evaluated today.  He has expected postoperative pain.  Slowly improving pain adding today due to low.  Also he was changed to Dilaudid.  He has been having difficulty breathing.  CT scan today shows atelectasis.  This is expected due to the pain.  Agree with supplemental oxygenation and incentive spirometer.  Patient also COVID-positive.  Patient evaluated by pulmonology.  Appreciate recommendations.  Today ambulate patient has some night shifts and oral meds with sips of water.  We will continue with IV antibiotic therapy.  Due to severe purulent feculent peritonitis.  We will continue with aggressive pain management.   Appreciate hospitalist management of medical comorbidities.   Arnold Long, MD

## 2021-04-27 NOTE — Progress Notes (Addendum)
[facility] Progress Note  Patient: Timothy Cameron    [id number]  DOA: 04/26/2021     Date of Service: the patient was seen and examined on 04/27/2021  Chief Complaint  Patient presents with   Abdominal Pain   Brief hospital course: Timothy Cameron is a 56 y.o. 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  Patient is being taken to the OR at the time of this consultation and denies complaints outside of presenting symptoms of right-sided lower quadrant pain and nausea.  He denies a history of CAD, CHF, A. fib, syncope, seizures, stroke, COPD or OSA.  He denies cough or shortness of breath and at baseline is good exercise tolerance  Assessment and Plan:  Bowel perforation ([place]) S/p ex lap, JP drain intact Continue Zosyn for peritonitis NG tube intact with intermittent suction Patient is n.p.o. Continue IV fluid for hydration -Management per surgery   COVID-19 viral infection positive, incidental finding CXR shows possible left lung collapse CT chest: complete atelectasis in the left lower lobe, possibly due to mucous plugging in the central bronchi. Small left pleuraleffusion. There are patchy ground-glass infiltrates in the right lung suggesting possible multifocal pneumonia Started remdesivir for 5 days and Decadron IV for 10 days Started Mucinex 600 p.o. twice daily Continue incentive spirometry and flutter valve Continue chest PT Pulmonary critical care consulted for possible bronchoscopy Completed left atelectasis, hypertonic saline nebulized and instead respiratory ordered by PCCM   Diabetes (Timothy Cameron) - Sliding scale insulin coverage   HTN (hypertension) - Hydralazine IV as needed while NPO   HLD (hyperlipidemia) - Hold meds until oral route established  Body mass index is 30.68 kg/m.  Interventions:     Diet: NPO, advance  per primary team General surgery DVT Prophylaxis: Subcutaneous Lovenox   Advance goals of care discussion: Full code  Family Communication: family was NOT present at bedside, at the time of interview.  The pt provided permission to discuss medical plan with the family. Opportunity was given to ask question and all questions were answered satisfactorily.   Disposition:  Pt is from Home, admitted with bowel perforation for ex lap under general surgery, still n.p.o., NG tube with intermittent suction, which precludes a safe discharge. Discharge to home, when clinically stable.  Subjective: Overnight patient became very hypoxic and was placed on supplemental oxygen 6 L via nasal cannula.  Patient was complaining of shortness of breath and phlegm production, complaining of abdominal pain with coughing. Denied any chest pain or palpitations. Was hungry and was asking for food, was referred to general surgery primary team   Physical Exam: General:  alert oriented to time, place, and person.  Appear in moderate distress, affect appropriate Eyes: PERRLA ENT: Oral Mucosa Clear, moist  Neck: no JVD,  Cardiovascular: S1 and S2 Present, no Murmur,  Respiratory: Increased respiratory effort, decreased breath sounds on the left side, bilateral crackles, no significant wheezing appreciated.   Abdomen: Bowel sounds sluggish, soft and generalized tenderness, colostomy intact, JP drain intact, dressing soaked with dried blood, no active bleeding or discharge. Skin: No rashes Extremities: No pedal edema, no calf tenderness Neurologic: without any new focal findings Gait not checked due to patient safety concerns  Vitals:   04/27/21 0925 04/27/21 0935 04/27/21 1112 04/27/21 1504  BP: 139/85  122/73 123/79  Pulse: 92  84 80  Resp: 20  18 18   Temp:  99 F (37.2 C)  98.8 F (37.1 C) 98.6 F (37 C)  TempSrc: Oral  Oral   SpO2: 96% 95% 97% 99%  Weight:      Height:        Intake/Output Summary  (Last 24 hours) at 04/27/2021 1553 Last data filed at 04/27/2021 1154 Gross per 24 hour  Intake 2299.1 ml  Output 1020 ml  Net 1279.1 ml   Filed Weights   04/25/21 2222  Weight: 99.8 kg    Data Reviewed: I have personally reviewed and interpreted daily labs, tele strips, imagings as discussed above. I reviewed all nursing notes, pharmacy notes, vitals, pertinent old records I have discussed plan of care as described above with RN and patient/family.  CBC: Recent Labs  Lab 04/25/21 2232 04/27/21 0500  WBC 18.5* 14.5*  NEUTROABS 15.8*  --   HGB 18.6* 16.1  HCT 52.2* 47.1  MCV 91.3 92.5  PLT 216 [id number]*   Basic Metabolic Panel: Recent Labs  Lab 04/25/21 2232 04/27/21 0500  NA 133* 136  K 3.8 3.7  CL 100 106  CO2 21* 24  GLUCOSE 280* 187*  BUN 16 18  CREATININE 1.28* 0.80  CALCIUM 9.0 8.0*  MG  --  2.0  PHOS  --  2.5    Studies: CT CHEST WO CONTRAST  Result Date: 04/27/2021 CLINICAL DATA:  Difficulty breathing, infiltrate left lung EXAM: CT CHEST WITHOUT CONTRAST TECHNIQUE: Multidetector CT imaging of the chest was performed following the standard protocol without IV contrast. COMPARISON:  Chest radiograph done earlier today FINDINGS: Cardiovascular: There are few tiny coronary artery calcifications. Mediastinum/Nodes: No significant lymphadenopathy seen. Lungs/Pleura: There is 3.6 cm focal ground-glass density in the anterolateral aspect of right upper lobe. There are other smaller foci of ground-glass densities in the medial right mid lung fields and posterior segment of right upper lobe. There are small foci of ground-glass infiltrates in the right middle lobe and right lower lobe. There are linear densities in the posterior right lower lung fields. There is complete atelectasis of left lower lobe. There are filling defects in the left main bronchus. No significant focal abnormality is seen in the left upper lobe. There is minimal left pleural effusion. There is no  pneumothorax. Upper Abdomen: Tip of enteric tube is seen in the stomach. There is small amount of pneumoperitoneum adjacent to the liver. Pneumoperitoneum was also seen in the previous CT abdomen done on 04/25/2021. Increased density in the lumen of gallbladder may be related to vicarious contrast excretion into the bile or suggest presence of sludge or tiny stones. There is no wall thickening in gallbladder. Musculoskeletal: Unremarkable. IMPRESSION: There is complete atelectasis in the left lower lobe, possibly due to mucous plugging in the central bronchi. Small left pleural effusion. There are patchy ground-glass infiltrates in the right lung suggesting possible multifocal pneumonia. Small amount of pneumoperitoneum is noted adjacent to the liver which may suggest recent abdominal surgery or suggest bowel perforation. Other findings as described in the body of the report. Electronically Signed   By: Elmer Picker M.D.   On: 04/27/2021 11:01   DG Chest Port 1 View  Result Date: 04/27/2021 CLINICAL DATA:  56 year old male positive COVID-19. Shortness of breath. EXAM: PORTABLE CHEST 1 VIEW COMPARISON:  04/26/2021 portable chest and earlier. FINDINGS: Portable AP semi upright view at 0543 hours. Stable enteric tube. Mildly lower lung volumes and interval substantial opacification of the left medial and lower lung. Some left hilar air bronchograms. Some leftward shift  of the mediastinum. Visible mediastinal contours remain normal. Obscured left hemidiaphragm. No pneumothorax. Right lung appears negative. No pleural effusion is evident. No acute osseous abnormality identified. IMPRESSION: 1. Moderate collapse and/or consolidation of the left lung since 04/26/2021. 2. Negative right lung.  Stable enteric tube. Electronically Signed   By: Genevie Ann M.D.   On: 04/27/2021 06:43    Scheduled Meds:  albuterol  2 puff Inhalation Q6H   Chlorhexidine Gluconate Cloth  6 each Topical Daily   dexamethasone (DECADRON)  injection  6 mg Intravenous Q24H   enoxaparin (LOVENOX) injection  0.5 mg/kg Subcutaneous Q24H   gabapentin  300 mg Oral BID   guaiFENesin  600 mg Oral BID   insulin aspart  0-15 Units Subcutaneous TID WC   ipratropium  2 puff Inhalation Q6H   ketorolac  30 mg Intravenous Q6H   pantoprazole (PROTONIX) IV  40 mg Intravenous QHS   sodium chloride HYPERTONIC  4 mL Nebulization Daily   Continuous Infusions:  sodium chloride 50 mL/hr at 04/27/21 1504   azithromycin     piperacillin-tazobactam (ZOSYN)  IV 3.375 g (04/27/21 1507)   [START ON 04/28/2021] remdesivir 100 mg in NS 100 mL     PRN Meds: hydrALAZINE, HYDROcodone-acetaminophen, HYDROmorphone (DILAUDID) injection, ondansetron **OR** ondansetron (ZOFRAN) IV  Time spent: 35 minutes  Author: Val Riles. MD [facility] 04/27/2021 3:53 PM  To reach On-call, see care teams to locate the attending and reach out to them via [email or web address]. If 7PM-7AM, please contact night-coverage If you still have difficulty reaching the attending provider, please page the [facility] (Director on Call) for [facility] on amion for assistance.

## 2021-04-27 NOTE — Consult Note (Signed)
PULMONOLOGY         Date: 04/27/2021,   MRN# [id number] Timothy Cameron [facility] 04/22/1966     AdmissionWeight: 99.8 kg                 CurrentWeight: 99.8 kg   Referring physician: Dr Lucianne Muss   CHIEF COMPLAINT:   Left lung complete atelectasis with mucus plugging.    HISTORY OF PRESENT ILLNESS   This is a pleasant male with hx of DM , dyslipidemia, HTN, admitted with abd pain evening of 04/26/21.  He was noted to have signs and symptoms of sepsis with circulatory shock responsive to IVF.  He had CT imaging abdomen which revealed perforated viscus. He was seen by surgery and is s/p operative intervention with ex lap and sigmoid colectomy and appendectomy.    PAST MEDICAL HISTORY   Past Medical History:  Diagnosis Date   Diabetes (HCC)    High cholesterol    Hypertension      SURGICAL HISTORY   Past Surgical History:  Procedure Laterality Date   ABDOMINAL SURGERY     APPENDECTOMY  04/26/2021   Procedure: APPENDECTOMY;  Surgeon: Carolan Shiver, MD;  Location: [facility] ORS;  Service: General;;   COLON RESECTION SIGMOID  04/26/2021   Procedure: partial colectomy with anastomosis;  Surgeon: Carolan Shiver, MD;  Location: [facility] ORS;  Service: General;;   COLONOSCOPY N/A 03/09/2016   Procedure: COLONOSCOPY;  Surgeon: Charlott Rakes, MD;  Location: [facility] ENDOSCOPY;  Service: Endoscopy;  Laterality: N/A;   ILEOSTOMY  04/26/2021   Procedure: ILEOSTOMY Creation;  Surgeon: Carolan Shiver, MD;  Location: [facility] ORS;  Service: General;;   INCISIONAL HERNIA REPAIR  04/26/2021   Procedure: HERNIA REPAIR INCISIONAL;  Surgeon: Carolan Shiver, MD;  Location: [facility] ORS;  Service: General;;     FAMILY HISTORY   History reviewed. No pertinent family history.   SOCIAL HISTORY   Social History   Tobacco Use   Smoking status: Every Day    Types: Cigars    Last attempt to quit: 10/23/2017    Years since quitting: 3.5   Smokeless tobacco: Never  Vaping  Use   Vaping Use: Never used  Substance Use Topics   Alcohol use: Yes    Alcohol/week: 30.0 standard drinks    Types: 30 Cans of beer per week   Drug use: Yes    Types: Cocaine, Marijuana     MEDICATIONS    Home Medication:    Current Medication:  Current Facility-Administered Medications:    0.9 %  sodium chloride infusion, , Intravenous, Continuous, Gillis Santa, MD, Last Rate: 75 mL/hr at 04/27/21 1138, Infusion Verify at 04/27/21 1138   albuterol (VENTOLIN HFA) 108 (90 Base) MCG/ACT inhaler 2 puff, 2 puff, Inhalation, Q6H, Lucianne Muss, Dileep, MD   azithromycin (ZITHROMAX) 500 mg in sodium chloride 0.9 % 250 mL IVPB, 500 mg, Intravenous, Q24H, Gillis Santa, MD   Chlorhexidine Gluconate Cloth 2 % PADS 6 each, 6 each, Topical, Daily, Cintron-Diaz, Edgardo, MD, 6 each at 04/27/21 0911   dexamethasone (DECADRON) injection 6 mg, 6 mg, Intravenous, Q24H, Lucianne Muss, Dileep, MD   enoxaparin (LOVENOX) injection 50 mg, 0.5 mg/kg, Subcutaneous, Q24H, Cintron-Diaz, Edgardo, MD, 50 mg at 04/27/21 7353   gabapentin (NEURONTIN) capsule 300 mg, 300 mg, Oral, BID, Cintron-Diaz, Edgardo, MD   guaiFENesin (MUCINEX) 12 hr tablet 600 mg, 600 mg, Oral, BID, Lucianne Muss, Dileep, MD   hydrALAZINE (APRESOLINE) injection 5 mg, 5 mg, Intravenous, Q4H PRN, Carolan Shiver, MD   HYDROcodone-acetaminophen (  NORCO/VICODIN) 5-325 MG per tablet 1-2 tablet, 1-2 tablet, Oral, Q4H PRN, Herbert Pun, MD   HYDROmorphone (DILAUDID) injection 1 mg, 1 mg, Intravenous, Q4H PRN, Piscoya, Jose, MD, 1 mg at 04/27/21 1143   insulin aspart (novoLOG) injection 0-15 Units, 0-15 Units, Subcutaneous, TID WC, Herbert Pun, MD, 3 Units at 04/27/21 1144   ipratropium (ATROVENT HFA) inhaler 2 puff, 2 puff, Inhalation, Q6H, Val Riles, MD   ketorolac (TORADOL) 30 MG/ML injection 30 mg, 30 mg, Intravenous, Q6H, Cintron-Diaz, Edgardo, MD, 30 mg at 04/27/21 1141   ondansetron (ZOFRAN-ODT) disintegrating  tablet 4 mg, 4 mg, Oral, Q6H PRN **OR** ondansetron (ZOFRAN) injection 4 mg, 4 mg, Intravenous, Q6H PRN, Windell Moment, Edgardo, MD   pantoprazole (PROTONIX) injection 40 mg, 40 mg, Intravenous, QHS, Cintron-Diaz, Edgardo, MD, 40 mg at 04/26/21 2005   piperacillin-tazobactam (ZOSYN) IVPB 3.375 g, 3.375 g, Intravenous, Q8H, Herbert Pun, MD, Stopped at 04/27/21 [id number]    ALLERGIES   Patient has no known allergies.     REVIEW OF SYSTEMS    Review of Systems:  Gen:  Denies  fever, sweats, chills weigh loss  HEENT: Denies blurred vision, double vision, ear pain, eye pain, hearing loss, nose bleeds, sore throat Cardiac:  No dizziness, chest pain or heaviness, chest tightness,edema Resp:   Denies cough or sputum porduction, shortness of breath,wheezing, hemoptysis,  Gi: Denies swallowing difficulty, stomach pain, nausea or vomiting, diarrhea, constipation, bowel incontinence Gu:  Denies bladder incontinence, burning urine Ext:   Denies Joint pain, stiffness or swelling Skin: Denies  skin rash, easy bruising or bleeding or hives Endoc:  Denies polyuria, polydipsia , polyphagia or weight change Psych:   Denies depression, insomnia or hallucinations   Other:  All other systems negative   VS: BP 122/73 (BP Location: Right Arm)    Pulse 84    Temp 98.8 F (37.1 C) (Oral)    Resp 18    Ht 5\' 11"  (1.803 m)    Wt 99.8 kg    SpO2 97%    BMI 30.68 kg/m      PHYSICAL EXAM    GENERAL:NAD, no fevers, chills, no weakness no fatigue HEAD: Normocephalic, atraumatic.  EYES: Pupils equal, round, reactive to light. Extraocular muscles intact. No scleral icterus.  MOUTH: Moist mucosal membrane. Dentition intact. No abscess noted.  EAR, NOSE, THROAT: Clear without exudates. No external lesions.  NECK: Supple. No thyromegaly. No nodules. No JVD.  PULMONARY: Decreased air entry worse on left  CARDIOVASCULAR: S1 and S2. Regular rate and rhythm. No murmurs, rubs, or gallops. No edema. Pedal  pulses 2+ bilaterally.  GASTROINTESTINAL: No masses. Positive bowel sounds. No hepatosplenomegaly.  +surgical wound with dressing tender to touch MUSCULOSKELETAL: No swelling, clubbing, or edema. Range of motion full in all extremities.  NEUROLOGIC: Cranial nerves II through XII are intact. No gross focal neurological deficits. Sensation intact. Reflexes intact.  SKIN: No ulceration, lesions, rashes, or cyanosis. Skin warm and dry. Turgor intact.  PSYCHIATRIC: Mood, affect within normal limits. The patient is awake, alert and oriented x 3. Insight, judgment intact.       IMAGING    CT CHEST WO CONTRAST  Result Date: 04/27/2021 CLINICAL DATA:  Difficulty breathing, infiltrate left lung EXAM: CT CHEST WITHOUT CONTRAST TECHNIQUE: Multidetector CT imaging of the chest was performed following the standard protocol without IV contrast. COMPARISON:  Chest radiograph done earlier today FINDINGS: Cardiovascular: There are few tiny coronary artery calcifications. Mediastinum/Nodes: No significant lymphadenopathy seen. Lungs/Pleura: There is 3.6 cm focal ground-glass density  in the anterolateral aspect of right upper lobe. There are other smaller foci of ground-glass densities in the medial right mid lung fields and posterior segment of right upper lobe. There are small foci of ground-glass infiltrates in the right middle lobe and right lower lobe. There are linear densities in the posterior right lower lung fields. There is complete atelectasis of left lower lobe. There are filling defects in the left main bronchus. No significant focal abnormality is seen in the left upper lobe. There is minimal left pleural effusion. There is no pneumothorax. Upper Abdomen: Tip of enteric tube is seen in the stomach. There is small amount of pneumoperitoneum adjacent to the liver. Pneumoperitoneum was also seen in the previous CT abdomen done on 04/25/2021. Increased density in the lumen of gallbladder may be related to  vicarious contrast excretion into the bile or suggest presence of sludge or tiny stones. There is no wall thickening in gallbladder. Musculoskeletal: Unremarkable. IMPRESSION: There is complete atelectasis in the left lower lobe, possibly due to mucous plugging in the central bronchi. Small left pleural effusion. There are patchy ground-glass infiltrates in the right lung suggesting possible multifocal pneumonia. Small amount of pneumoperitoneum is noted adjacent to the liver which may suggest recent abdominal surgery or suggest bowel perforation. Other findings as described in the body of the report. Electronically Signed   By: Elmer Picker M.D.   On: 04/27/2021 11:01   CT ABDOMEN PELVIS W CONTRAST  Result Date: 04/26/2021 CLINICAL DATA:  RLQ abdominal pain (Age >= 14y) EXAM: CT ABDOMEN AND PELVIS WITH CONTRAST TECHNIQUE: Multidetector CT imaging of the abdomen and pelvis was performed using the standard protocol following bolus administration of intravenous contrast. CONTRAST:  115mL OMNIPAQUE IOHEXOL 300 MG/ML  SOLN COMPARISON:  None. FINDINGS: Lower chest: No acute abnormality. Hepatobiliary: No focal liver abnormality. No gallstones, gallbladder wall thickening, or pericholecystic fluid. No biliary dilatation. Pancreas: No focal lesion. Normal pancreatic contour. No surrounding inflammatory changes. No main pancreatic ductal dilatation. Spleen: Normal in size without focal abnormality. Adrenals/Urinary Tract: No adrenal nodule bilaterally. Bilateral kidneys enhance symmetrically. No hydronephrosis. No hydroureter. The urinary bladder is unremarkable. Stomach/Bowel: Stomach is within normal limits. No evidence of bowel wall thickening or dilatation. No pneumatosis. Scattered colonic diverticulosis. Suggestion of appendectomy. Vascular/Lymphatic: No definite portal venous or mesenteric venous gas. The main portal, splenic, superior mesenteric veins are patent. No abdominal aorta or iliac aneurysm. Mild  atherosclerotic plaque of the aorta and its branches. No abdominal, pelvic, or inguinal lymphadenopathy. Reproductive: Prostate is unremarkable. Other: Small volume free fluid and small volume pneumoperitoneum. Query origin of pneumoperitoneum and free fluid from a perforation involving the small bowel or rectum (5:43). No organized fluid collection. Musculoskeletal: Small fat containing umbilical hernia with an abdominal defect of 2.1 cm. Small fat containing left spigelian hernia just inferior to the level of the umbilicus with an abdominal defect of 0.9 cm. Possible tiny fat containing left inguinal hernia. No suspicious lytic or blastic osseous lesions. No acute displaced fracture. Multilevel degenerative changes of the spine. IMPRESSION: 1. Small volume pneumoperitoneum and small volume ascites likely originating from a bowel perforation. Likely origin of perforation includes the small bowel or rectum in the anterior lower abdomen/pelvis. 2. Scattered colonic diverticulosis with no acute diverticulitis. 3. Small fat containing umbilical hernia with an abdominal defect of 2.1 cm. Small fat containing left spigelian hernia just inferior to the level of the umbilicus with an abdominal defect of 0.9 cm. Possible tiny fat containing left inguinal hernia.  These results were called by telephone at the time of interpretation on 04/25/2021 at 11:57 pm to provider Dr. Leonides Schanz, who verbally acknowledged these results. Electronically Signed   By: Iven Finn M.D.   On: 04/26/2021 00:03   DG Chest Port 1 View  Result Date: 04/27/2021 CLINICAL DATA:  55 year old male positive COVID-19. Shortness of breath. EXAM: PORTABLE CHEST 1 VIEW COMPARISON:  04/26/2021 portable chest and earlier. FINDINGS: Portable AP semi upright view at 0543 hours. Stable enteric tube. Mildly lower lung volumes and interval substantial opacification of the left medial and lower lung. Some left hilar air bronchograms. Some leftward shift of the  mediastinum. Visible mediastinal contours remain normal. Obscured left hemidiaphragm. No pneumothorax. Right lung appears negative. No pleural effusion is evident. No acute osseous abnormality identified. IMPRESSION: 1. Moderate collapse and/or consolidation of the left lung since 04/26/2021. 2. Negative right lung.  Stable enteric tube. Electronically Signed   By: Genevie Ann M.D.   On: 04/27/2021 06:43   DG Chest Port 1 View  Result Date: 04/26/2021 CLINICAL DATA:  Short of breath, COVID-19 positive EXAM: PORTABLE CHEST 1 VIEW COMPARISON:  11/20/2018, 04/25/2021 FINDINGS: Single frontal view of the chest demonstrates enteric catheter passing below diaphragm tip excluded by collimation. Cardiac silhouette is stable. Increased consolidation at the left lung base with associated volume loss consistent with atelectasis. Small left pleural effusion is not excluded. No pneumothorax. No acute bony abnormalities. IMPRESSION: 1. Developing left basilar consolidation and associated volume loss, consistent with atelectasis. 2. Enteric catheter, tip excluded by collimation. Electronically Signed   By: Randa Ngo M.D.   On: 04/26/2021 15:18      ASSESSMENT/PLAN     Acute hypoxemic respiratory failure Acute COVID19 infection -Remdesevir antiviral - pharmacy protocol 5 d -vitamin C -zinc -decadron 6mg  IV daily  -encourage to use IS and Acapella device for bronchopulmonary hygiene when able -d/c hepatotoxic medications while on remdesevir -supportive care with ICU telemetry monitoring -PT/OT when possible -procalcitonin, CRP and ferritin trending  Complete left atelectasis    Hypertonic saline nebulized 3% 68ml once daily   -incentive spirometry    Thank you for allowing me to participate in the care of this patient.  Total face to face encounter time for this patient visit was >45 min. >50% of the time was  spent in counseling and coordination of care.   Patient/Family are satisfied with care plan and  all questions have been answered.  This document was prepared using Dragon voice recognition software and may include unintentional dictation errors.     Ottie Glazier, M.D.  Division of [facility]

## 2021-04-27 NOTE — Progress Notes (Signed)
RT called to bedside to place patient on HFNC due to desaturation. Patient placed on 15L HFNC, SAT 96%. Per NP, okay to hold on ABG due to improvement of oxygenation status at this time. Will continue to monitor.

## 2021-04-27 NOTE — Progress Notes (Addendum)
Inpatient Diabetes Program Recommendations  AACE/ADA: New Consensus Statement on Inpatient Glycemic Control  Target Ranges:  Prepandial:   less than 140 mg/dL      Peak postprandial:   less than 180 mg/dL (1-2 hours)      Critically ill patients:  140 - 180 mg/dL    Latest Reference Range & Units 04/27/21 08:51  Glucose-Capillary 70 - 99 mg/dL 186 (H)     Latest Reference Range & Units 04/26/21 08:57 04/26/21 11:11 04/26/21 16:00 04/26/21 21:58  Glucose-Capillary 70 - 99 mg/dL 285 (H) 203 (H) 143 (H) 184 (H)   Review of Glycemic Control  Diabetes history: DM2 Outpatient Diabetes medications: Glipizide 10 mg daily Current orders for Inpatient glycemic control: Novolog 0-15 units TID with meals  Inpatient Diabetes Program Recommendations:    Insulin: Please consider changing CBGs to Q4H and Novolog to 0-15 units Q4H.  Thanks, Barnie Alderman, RN, MSN, CDE Diabetes Coordinator Inpatient Diabetes Program [phone] (Team Pager from 8am to 5pm)

## 2021-04-27 NOTE — Consult Note (Signed)
PHARMACY - PHYSICIAN COMMUNICATION CRITICAL VALUE ALERT - BLOOD CULTURE IDENTIFICATION (BCID)  Timothy Cameron is an 56 y.o. male who presented to [facility] on 04/26/2021 with a chief complaint of abdominal pain  Assessment:   1/5 PM - 1 out of 4 bottles with GPC (aerobic bottle), BCID detected MRSE 1/6 notified that there is a GPR in 1 of 4 bottles (in the anaerobic bottle)   Name of physician (or Provider) Contacted: Dr Peyton Najjar  Current antibiotics: pip/tazo   Changes to prescribed antibiotics recommended:  Continue piperacillin/tazobactam.  GPR may represent contaminant since in same sets as the [facility], but with recent bowel perforation, may be clostridia species (pip/tazo would have activity against this)  Results for orders placed or performed during the hospital encounter of 04/26/21  Blood Culture ID Panel (Reflexed) (Collected: 04/26/2021 12:40 AM)  Result Value Ref Range   Enterococcus faecalis NOT DETECTED NOT DETECTED   Enterococcus Faecium NOT DETECTED NOT DETECTED   Listeria monocytogenes NOT DETECTED NOT DETECTED   Staphylococcus species DETECTED (A) NOT DETECTED   Staphylococcus aureus (BCID) NOT DETECTED NOT DETECTED   Staphylococcus epidermidis DETECTED (A) NOT DETECTED   Staphylococcus lugdunensis NOT DETECTED NOT DETECTED   Streptococcus species NOT DETECTED NOT DETECTED   Streptococcus agalactiae NOT DETECTED NOT DETECTED   Streptococcus pneumoniae NOT DETECTED NOT DETECTED   Streptococcus pyogenes NOT DETECTED NOT DETECTED   A.calcoaceticus-baumannii NOT DETECTED NOT DETECTED   Bacteroides fragilis NOT DETECTED NOT DETECTED   Enterobacterales NOT DETECTED NOT DETECTED   Enterobacter cloacae complex NOT DETECTED NOT DETECTED   Escherichia coli NOT DETECTED NOT DETECTED   Klebsiella aerogenes NOT DETECTED NOT DETECTED   Klebsiella oxytoca NOT DETECTED NOT DETECTED   Klebsiella pneumoniae NOT DETECTED NOT DETECTED   Proteus species NOT DETECTED NOT DETECTED    Salmonella species NOT DETECTED NOT DETECTED   Serratia marcescens NOT DETECTED NOT DETECTED   Haemophilus influenzae NOT DETECTED NOT DETECTED   Neisseria meningitidis NOT DETECTED NOT DETECTED   Pseudomonas aeruginosa NOT DETECTED NOT DETECTED   Stenotrophomonas maltophilia NOT DETECTED NOT DETECTED   Candida albicans NOT DETECTED NOT DETECTED   Candida auris NOT DETECTED NOT DETECTED   Candida glabrata NOT DETECTED NOT DETECTED   Candida krusei NOT DETECTED NOT DETECTED   Candida parapsilosis NOT DETECTED NOT DETECTED   Candida tropicalis NOT DETECTED NOT DETECTED   Cryptococcus neoformans/gattii NOT DETECTED NOT DETECTED   Methicillin resistance mecA/C DETECTED (A) NOT DETECTED   Doreene Eland, PharmD, BCPS, BCIDP Work Cell: [phone] 04/27/2021 2:59 PM

## 2021-04-28 ENCOUNTER — Inpatient Hospital Stay: Payer: 59

## 2021-04-28 DIAGNOSIS — K631 Perforation of intestine (nontraumatic): Secondary | ICD-10-CM | POA: Diagnosis not present

## 2021-04-28 LAB — URINALYSIS, ROUTINE W REFLEX MICROSCOPIC
Bilirubin Urine: NEGATIVE
Glucose, UA: >=500 mg/dL — AB
Hgb urine dipstick: NEGATIVE
Ketones, ur: 20 mg/dL — AB
Leukocytes,Ua: NEGATIVE
Nitrite: NEGATIVE
Protein, ur: NEGATIVE mg/dL
Specific Gravity, Urine: 1.03 (ref 1.005–1.030)
Squamous Epithelial / HPF: NONE SEEN (ref 0–5)
pH: 6 (ref 5.0–8.0)

## 2021-04-28 LAB — BLOOD CULTURE ID PANEL (REFLEXED) - BCID2

## 2021-04-28 LAB — MAGNESIUM: Magnesium: 2.1 mg/dL (ref 1.7–2.4)

## 2021-04-28 LAB — GLUCOSE, CAPILLARY
Glucose-Capillary: 167 mg/dL — ABNORMAL HIGH (ref 70–99)
Glucose-Capillary: 188 mg/dL — ABNORMAL HIGH (ref 70–99)
Glucose-Capillary: 224 mg/dL — ABNORMAL HIGH (ref 70–99)
Glucose-Capillary: 283 mg/dL — ABNORMAL HIGH (ref 70–99)

## 2021-04-28 LAB — BASIC METABOLIC PANEL
Anion gap: 9 (ref 5–15)
BUN: 27 mg/dL — ABNORMAL HIGH (ref 6–20)
CO2: 21 mmol/L — ABNORMAL LOW (ref 22–32)
Calcium: 8.2 mg/dL — ABNORMAL LOW (ref 8.9–10.3)
Chloride: 104 mmol/L (ref 98–111)
Creatinine, Ser: 0.75 mg/dL (ref 0.61–1.24)
GFR, Estimated: >60 mL/min (ref >60)
Glucose, Bld: 206 mg/dL — ABNORMAL HIGH (ref 70–99)
Potassium: 4.1 mmol/L (ref 3.5–5.1)
Sodium: 134 mmol/L — ABNORMAL LOW (ref 135–145)

## 2021-04-28 LAB — CBC
HCT: 41.8 % (ref 39.0–52.0)
Hemoglobin: 14.7 g/dL (ref 13.0–17.0)
MCH: 32.9 pg (ref 26.0–34.0)
MCHC: 35.2 g/dL (ref 30.0–36.0)
MCV: 93.5 fL (ref 80.0–100.0)
Platelets: 142 10*3/uL — ABNORMAL LOW (ref 150–400)
RBC: 4.47 MIL/uL (ref 4.22–5.81)
RDW: 12.1 % (ref 11.5–15.5)
WBC: 13.5 10*3/uL — ABNORMAL HIGH (ref 4.0–10.5)
nRBC: 0 % (ref 0.0–0.2)

## 2021-04-28 LAB — HEPATIC FUNCTION PANEL
ALT: 16 U/L (ref 0–44)
AST: 15 U/L (ref 15–41)
Albumin: 2.8 g/dL — ABNORMAL LOW (ref 3.5–5.0)
Alkaline Phosphatase: 40 U/L (ref 38–126)
Bilirubin, Direct: 0.3 mg/dL — ABNORMAL HIGH (ref 0.0–0.2)
Indirect Bilirubin: 0.9 mg/dL (ref 0.3–0.9)
Total Bilirubin: 1.2 mg/dL (ref 0.3–1.2)
Total Protein: 5.9 g/dL — ABNORMAL LOW (ref 6.5–8.1)

## 2021-04-28 LAB — PHOSPHORUS: Phosphorus: 3.4 mg/dL (ref 2.5–4.6)

## 2021-04-28 NOTE — Progress Notes (Signed)
Patient ID: Timothy Cameron, male   DOB: August 12, 1965, 56 y.o.   MRN: [id number]     [place] Hospital Day(s): 2.   Interval History: Patient seen and examined, no acute events or new complaints overnight. Patient reports feeling better. Pain better controlled. Improved cough. Doing insentive spirometer better. Some bile in ileostomy bag.  Vital signs in last 24 hours: [min-max] current  Temp:  [98 F (36.7 C)-99.3 F (37.4 C)] 98.2 F (36.8 C) (01/07 0843) Pulse Rate:  [54-84] 65 (01/07 0843) Resp:  [18-20] 18 (01/07 0843) BP: (119-123)/(73-84) 121/84 (01/07 0843) SpO2:  [97 %-100 %] 100 % (01/07 0843)     Height: 5\' 11"  (180.3 cm) Weight: 99.8 kg BMI (Calculated): 30.7   Physical Exam:  Constitutional: alert, cooperative and no distress  Respiratory: breathing non-labored at rest  Cardiovascular: regular rate and sinus rhythm  Gastrointestinal: soft, non-tender, and mild-distended  Labs:  CBC Latest Ref Rng & Units 04/28/2021 04/27/2021 04/25/2021  WBC 4.0 - 10.5 K/uL 13.5(H) 14.5(H) 18.5(H)  Hemoglobin 13.0 - 17.0 g/dL 14.7 16.1 18.6(H)  Hematocrit 39.0 - 52.0 % 41.8 47.1 52.2(H)  Platelets 150 - 400 K/uL 142(L) 139(L) 216   CMP Latest Ref Rng & Units 04/28/2021 04/27/2021 04/25/2021  Glucose 70 - 99 mg/dL 206(H) 187(H) 280(H)  BUN 6 - 20 mg/dL 27(H) 18 16  Creatinine 0.61 - 1.24 mg/dL 0.75 0.80 1.28(H)  Sodium 135 - 145 mmol/L 134(L) 136 133(L)  Potassium 3.5 - 5.1 mmol/L 4.1 3.7 3.8  Chloride 98 - 111 mmol/L 104 106 100  CO2 22 - 32 mmol/L 21(L) 24 21(L)  Calcium 8.9 - 10.3 mg/dL 8.2(L) 8.0(L) 9.0  Total Protein 6.5 - 8.1 g/dL 5.9(L) - 7.4  Total Bilirubin 0.3 - 1.2 mg/dL 1.2 - 1.6(H)  Alkaline Phos 38 - 126 U/L 40 - 51  AST 15 - 41 U/L 15 - 32  ALT 0 - 44 U/L 16 - 28    Imaging studies: No new pertinent imaging studies   Assessment/Plan:  56 y.o. male with perforated diverticulitis 2 Day Post-Op s/p partial colectomy, appendectomy, laparotomy, complicated by  pertinent comorbidities including uncontrolled diabetes, hypertension.  Patient recovering slowly. No fever. Decreasing WBC count. Improved pain and cough. Some bilious output on ileostomy. Will clamp NGT and start clear liquid trial.   Encourage to ambulate  Continue IV abx therapy and pain management.   Appreciate Hospitalist and Pulmonologist for recommendations.   Arnold Long, MD

## 2021-04-28 NOTE — Progress Notes (Signed)
[facility] Progress Note  Patient: Timothy Cameron    YFV:[id number]  DOA: 04/26/2021     Date of Service: the patient was seen and examined on 04/28/2021  Chief Complaint  Patient presents with   Abdominal Pain   Brief hospital course: RAMON ZANDERS is a 56 y.o. 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  Patient is being taken to the OR at the time of this consultation and denies complaints outside of presenting symptoms of right-sided lower quadrant pain and nausea.  He denies a history of CAD, CHF, A. fib, syncope, seizures, stroke, COPD or OSA.  He denies cough or shortness of breath and at baseline is good exercise tolerance  Assessment and Plan:  Bowel perforation (HCC) S/p ex lap, JP drain intact Continue Zosyn for peritonitis NG tube clamped today by general surgery, started clear liquid trial Continue IV fluid for hydration -Management per surgery   COVID-19 viral infection positive, incidental finding CXR shows possible left lung collapse CT chest: complete atelectasis in the left lower lobe, possibly due to mucous plugging in the central bronchi. Small left pleuraleffusion. There are patchy ground-glass infiltrates in the right lung suggesting possible multifocal pneumonia Started remdesivir for 5 days and Decadron IV for 10 days Started Mucinex 600 p.o. twice daily Continue incentive spirometry and flutter valve Continue chest PT Pulmonary critical care consulted for possible bronchoscopy Completed left atelectasis, hypertonic saline nebulized and instead respiratory ordered by [facility] 1/7 CXR Significant interval improvement with near complete resolution of the left mid to lower lung opacities   Diabetes (HCC) - Sliding scale insulin coverage   HTN (hypertension) - Hydralazine IV as needed while NPO   HLD  (hyperlipidemia) - Hold meds until oral route established  Body mass index is 30.68 kg/m.  Interventions:     Diet: NPO, advance per primary team General surgery DVT Prophylaxis: Subcutaneous Lovenox   Advance goals of care discussion: Full code  Family Communication: family was NOT present at bedside, at the time of interview.  The pt provided permission to discuss medical plan with the family. Opportunity was given to ask question and all questions were answered satisfactorily.   Disposition:  Pt is from Home, admitted with bowel perforation for ex lap under general surgery, still n.p.o., NG tube with intermittent suction, which precludes a safe discharge. Discharge to home, when clinically stable.  Subjective: No significant overnight events, patient's respiratory status is getting better, still requiring 6/10 via nasal cannula.  Patient feels better.  Pain is under control now. NG tube is still intact but it will be clamped today and patient will be started on clear liquid diet. Patient denies any chest palpitation, no any other complaints.   Physical Exam: General:  alert oriented to time, place, and person.  Appear in moderate distress, affect appropriate Eyes: PERRLA ENT: Oral Mucosa Clear, moist  Neck: no JVD,  Cardiovascular: S1 and S2 Present, no Murmur,  Respiratory: normal respiratory effort, mild crackles bibasilar area, no significant wheezing appreciated.   Abdomen: Bowel sounds sluggish, soft and generalized tenderness, colostomy intact, JP drain intact, dressing soaked with dried blood, no active bleeding or discharge. Skin: No rashes Extremities: No pedal edema, no calf tenderness Neurologic: without any new focal findings Gait not checked due to patient safety concerns  Vitals:   04/27/21 2255 04/28/21 0359 04/28/21 0843 04/28/21 1458  BP: 123/81 119/73  121/84 133/87  Pulse:  (!) 54 65 68  Resp: 20 18 18 16   Temp: 98 F (36.7 C) 99.3 F (37.4 C) 98.2  F (36.8 C) 98.3 F (36.8 C)  TempSrc:    Oral  SpO2:  100% 100% 95%  Weight:      Height:        Intake/Output Summary (Last 24 hours) at 04/28/2021 1522 Last data filed at 04/28/2021 0900 Gross per 24 hour  Intake 1071.4 ml  Output 490 ml  Net 581.4 ml   Filed Weights   04/25/21 2222  Weight: 99.8 kg    Data Reviewed: I have personally reviewed and interpreted daily labs, tele strips, imagings as discussed above. I reviewed all nursing notes, pharmacy notes, vitals, pertinent old records I have discussed plan of care as described above with RN and patient/family.  CBC: Recent Labs  Lab 04/25/21 2232 04/27/21 0500 04/28/21 0604  WBC 18.5* 14.5* 13.5*  NEUTROABS 15.8*  --   --   HGB 18.6* 16.1 14.7  HCT 52.2* 47.1 41.8  MCV 91.3 92.5 93.5  PLT 216 139* 142*   Basic Metabolic Panel: Recent Labs  Lab 04/25/21 2232 04/27/21 0500 04/28/21 0604  NA 133* 136 134*  K 3.8 3.7 4.1  CL 100 106 104  CO2 21* 24 21*  GLUCOSE 280* 187* 206*  BUN 16 18 27*  CREATININE 1.28* 0.80 0.75  CALCIUM 9.0 8.0* 8.2*  MG  --  2.0 2.1  PHOS  --  2.5 3.4    Studies: DG Chest Port 1 View  Result Date: 04/28/2021 CLINICAL DATA:  COVID positive patient. Follow-up left atelectasis. Clinical symptoms improving. EXAM: PORTABLE CHEST 1 VIEW COMPARISON:  04/27/2021 and older exams. FINDINGS: There has been a significant interval improvement in lung aeration, with most of the previously seen left mid to lower lung opacity resolving. There are subtle residual areas of opacity bilaterally. No new lung abnormalities. No convincing pleural effusion.  No pneumothorax. Cardiac silhouette is normal in size. Normal mediastinal and hilar contours. Nasal/orogastric tube is stable, tip in the mid stomach. IMPRESSION: 1. Significant interval improvement with near complete resolution of the left mid to lower lung opacities. Electronically Signed   By: 06/25/2021 M.D.   On: 04/28/2021 10:12    Scheduled  Meds:  albuterol  2 puff Inhalation Q6H   dexamethasone (DECADRON) injection  6 mg Intravenous Q24H   enoxaparin (LOVENOX) injection  0.5 mg/kg Subcutaneous Q24H   gabapentin  300 mg Oral BID   guaiFENesin  600 mg Oral BID   insulin aspart  0-15 Units Subcutaneous TID WC   ipratropium  2 puff Inhalation Q6H   ketorolac  30 mg Intravenous Q6H   pantoprazole (PROTONIX) IV  40 mg Intravenous QHS   sodium chloride HYPERTONIC  4 mL Nebulization Daily   Continuous Infusions:  sodium chloride 50 mL/hr at 04/28/21 0311   azithromycin 500 mg (04/28/21 1457)   piperacillin-tazobactam (ZOSYN)  IV 3.375 g (04/28/21 1303)   remdesivir 100 mg in NS 100 mL 100 mg (04/28/21 1043)   PRN Meds: hydrALAZINE, HYDROcodone-acetaminophen, HYDROmorphone (DILAUDID) injection, ondansetron **OR** ondansetron (ZOFRAN) IV  Time spent: 35 minutes  Author: 06/26/21. MD [facility] 04/28/2021 3:22 PM  To reach On-call, see care teams to locate the attending and reach out to them via [email or web address]. If 7PM-7AM, please contact night-coverage If you still have difficulty reaching the attending provider, please page the [facility] (Director on Call) for [facility] on  amion for assistance.

## 2021-04-28 NOTE — TOC CM/SW Note (Addendum)
Attempted call into patient's room again to discuss [facility] resource and assess for other resource needs. No answer. Asked RN to give patient CSW's number to call. [facility] resource sheet given yesterday. [facility] RN note says [facility] is recommendation for follow up. MD will need to put in referral.   1:35- Spoke to patient via phone. Patient confirms he got the information on the [facility] in [place] and will follow up with them as needed. Patient says he can drive himself to appointments.  PCP is Dr. Aletta Edouard at [facility] and uses their Pharmacy.  Patient declines HHRN. Patient agreed to reach out with needs.  Oleh Genin, [place]

## 2021-04-28 NOTE — Consult Note (Signed)
PHARMACY - PHYSICIAN COMMUNICATION CRITICAL VALUE ALERT - BLOOD CULTURE IDENTIFICATION (BCID)  Timothy Cameron is an 56 y.o. male who presented to [facility] on 04/26/2021 with a chief complaint of abdominal pain  Assessment:   1/5 PM - 1 out of 4 bottles with GPC (aerobic bottle), BCID detected MRSE 1/6 notified that there is a GPR in 1 of 4 bottles (in the anaerobic bottle) - Lab called with an update. They do not see any GPR but it is a possible GNR.   Name of physician (or Provider) Contacted: Dr Maia Plan  Current antibiotics: pip/tazo   Changes to prescribed antibiotics recommended:  Continue piperacillin/tazobactam.   Results for orders placed or performed during the hospital encounter of 04/26/21  Blood Culture ID Panel (Reflexed) (Collected: 04/27/2021 12:40 AM)  Result Value Ref Range   Enterococcus faecalis NOT DETECTED NOT DETECTED   Enterococcus Faecium NOT DETECTED NOT DETECTED   Listeria monocytogenes NOT DETECTED NOT DETECTED   Staphylococcus species NOT DETECTED NOT DETECTED   Staphylococcus aureus (BCID) NOT DETECTED NOT DETECTED   Staphylococcus epidermidis NOT DETECTED NOT DETECTED   Staphylococcus lugdunensis NOT DETECTED NOT DETECTED   Streptococcus species NOT DETECTED NOT DETECTED   Streptococcus agalactiae NOT DETECTED NOT DETECTED   Streptococcus pneumoniae NOT DETECTED NOT DETECTED   Streptococcus pyogenes NOT DETECTED NOT DETECTED   A.calcoaceticus-baumannii NOT DETECTED NOT DETECTED   Bacteroides fragilis NOT DETECTED NOT DETECTED   Enterobacterales NOT DETECTED NOT DETECTED   Enterobacter cloacae complex NOT DETECTED NOT DETECTED   Escherichia coli NOT DETECTED NOT DETECTED   Klebsiella aerogenes NOT DETECTED NOT DETECTED   Klebsiella oxytoca NOT DETECTED NOT DETECTED   Klebsiella pneumoniae NOT DETECTED NOT DETECTED   Proteus species NOT DETECTED NOT DETECTED   Salmonella species NOT DETECTED NOT DETECTED   Serratia marcescens NOT DETECTED NOT  DETECTED   Haemophilus influenzae NOT DETECTED NOT DETECTED   Neisseria meningitidis NOT DETECTED NOT DETECTED   Pseudomonas aeruginosa NOT DETECTED NOT DETECTED   Stenotrophomonas maltophilia NOT DETECTED NOT DETECTED   Candida albicans NOT DETECTED NOT DETECTED   Candida auris NOT DETECTED NOT DETECTED   Candida glabrata NOT DETECTED NOT DETECTED   Candida krusei NOT DETECTED NOT DETECTED   Candida parapsilosis NOT DETECTED NOT DETECTED   Candida tropicalis NOT DETECTED NOT DETECTED   Cryptococcus neoformans/gattii NOT DETECTED NOT DETECTED   Timothy Cameron, PharmD, BCPS, BCIDP Work Cell: [phone] 04/28/2021 9:00 AM

## 2021-04-28 NOTE — Progress Notes (Signed)
PULMONOLOGY         Date: 04/28/2021,   MRN# [id number] Timothy Cameron [facility] 01-22-1966     AdmissionWeight: 99.8 kg                 CurrentWeight: 99.8 kg   Referring physician: Dr Dwyane Dee   CHIEF COMPLAINT:   Left lung complete atelectasis with mucus plugging.    HISTORY OF PRESENT ILLNESS   This is a pleasant male with hx of DM , dyslipidemia, HTN, admitted with abd pain evening of 04/26/21.  He was noted to have signs and symptoms of sepsis with circulatory shock responsive to IVF.  He had CT imaging abdomen which revealed perforated viscus. He was seen by surgery and is s/p operative intervention with ex lap and sigmoid colectomy and appendectomy.   04/28/21- patient improved clinically. Continue current care plan. No need for bronchoscopy at this time. Left sided infiltrate/atelectasis as below.     Narrative & Impression  CLINICAL DATA:  COVID positive patient. Follow-up left atelectasis. Clinical symptoms improving.   EXAM: PORTABLE CHEST 1 VIEW   COMPARISON:  04/27/2021 and older exams.   FINDINGS: There has been a significant interval improvement in lung aeration, with most of the previously seen left mid to lower lung opacity resolving. There are subtle residual areas of opacity bilaterally. No new lung abnormalities.   No convincing pleural effusion.  No pneumothorax.   Cardiac silhouette is normal in size. Normal mediastinal and hilar contours.   Nasal/orogastric tube is stable, tip in the mid stomach.   IMPRESSION: 1. Significant interval improvement with near complete resolution of the left mid to lower lung opacities.     Electronically Signed   By: Lajean Manes M.D.   On: 04/28/2021 10:12      PAST MEDICAL HISTORY   Past Medical History:  Diagnosis Date   Diabetes ([place])    High cholesterol    Hypertension      SURGICAL HISTORY   Past Surgical History:  Procedure Laterality Date   ABDOMINAL SURGERY     APPENDECTOMY  04/26/2021    Procedure: APPENDECTOMY;  Surgeon: Herbert Pun, MD;  Location: [facility] ORS;  Service: General;;   COLON RESECTION SIGMOID  04/26/2021   Procedure: partial colectomy with anastomosis;  Surgeon: Herbert Pun, MD;  Location: [facility] ORS;  Service: General;;   COLONOSCOPY N/A 03/09/2016   Procedure: COLONOSCOPY;  Surgeon: Wilford Corner, MD;  Location: [facility] ENDOSCOPY;  Service: Endoscopy;  Laterality: N/A;   ILEOSTOMY  04/26/2021   Procedure: ILEOSTOMY Creation;  Surgeon: Herbert Pun, MD;  Location: [facility] ORS;  Service: General;;   INCISIONAL HERNIA REPAIR  04/26/2021   Procedure: HERNIA REPAIR INCISIONAL;  Surgeon: Herbert Pun, MD;  Location: [facility] ORS;  Service: General;;     FAMILY HISTORY   History reviewed. No pertinent family history.   SOCIAL HISTORY   Social History   Tobacco Use   Smoking status: Every Day    Types: Cigars    Last attempt to quit: 10/23/2017    Years since quitting: 3.5   Smokeless tobacco: Never  Vaping Use   Vaping Use: Never used  Substance Use Topics   Alcohol use: Yes    Alcohol/week: 30.0 standard drinks    Types: 30 Cans of beer per week   Drug use: Yes    Types: Cocaine, Marijuana     MEDICATIONS    Home Medication:    Current Medication:  Current Facility-Administered Medications:  0.9 %  sodium chloride infusion, , Intravenous, Continuous, Val Riles, MD, Last Rate: 50 mL/hr at 04/28/21 0311, Infusion Verify at 04/28/21 0311   albuterol (VENTOLIN HFA) 108 (90 Base) MCG/ACT inhaler 2 puff, 2 puff, Inhalation, Q6H, Val Riles, MD, 2 puff at 04/28/21 0848   azithromycin (ZITHROMAX) 500 mg in sodium chloride 0.9 % 250 mL IVPB, 500 mg, Intravenous, Q24H, Val Riles, MD, Stopped at 04/27/21 1746   dexamethasone (DECADRON) injection 6 mg, 6 mg, Intravenous, Q24H, Val Riles, MD, 6 mg at 04/27/21 1508   enoxaparin (LOVENOX) injection 50 mg, 0.5 mg/kg, Subcutaneous, Q24H, Cintron-Diaz, Edgardo, MD, 50  mg at 04/28/21 0846   gabapentin (NEURONTIN) capsule 300 mg, 300 mg, Oral, BID, Herbert Pun, MD, 300 mg at 04/27/21 2111   guaiFENesin (MUCINEX) 12 hr tablet 600 mg, 600 mg, Oral, BID, Val Riles, MD, 600 mg at 04/27/21 2111   hydrALAZINE (APRESOLINE) injection 5 mg, 5 mg, Intravenous, Q4H PRN, Herbert Pun, MD   HYDROcodone-acetaminophen (NORCO/VICODIN) 5-325 MG per tablet 1-2 tablet, 1-2 tablet, Oral, Q4H PRN, Herbert Pun, MD   HYDROmorphone (DILAUDID) injection 1 mg, 1 mg, Intravenous, Q4H PRN, Piscoya, Jose, MD, 1 mg at 04/28/21 0847   insulin aspart (novoLOG) injection 0-15 Units, 0-15 Units, Subcutaneous, TID WC, Herbert Pun, MD, 3 Units at 04/28/21 0849   ipratropium (ATROVENT HFA) inhaler 2 puff, 2 puff, Inhalation, Q6H, Val Riles, MD, 2 puff at 04/28/21 0849   ketorolac (TORADOL) 30 MG/ML injection 30 mg, 30 mg, Intravenous, Q6H, Cintron-Diaz, Edgardo, MD, 30 mg at 04/28/21 0449   ondansetron (ZOFRAN-ODT) disintegrating tablet 4 mg, 4 mg, Oral, Q6H PRN **OR** ondansetron (ZOFRAN) injection 4 mg, 4 mg, Intravenous, Q6H PRN, Herbert Pun, MD   pantoprazole (PROTONIX) injection 40 mg, 40 mg, Intravenous, QHS, Cintron-Diaz, Reeves Forth, MD, 40 mg at 04/27/21 2110   piperacillin-tazobactam (ZOSYN) IVPB 3.375 g, 3.375 g, Intravenous, Q8H, Cintron-Diaz, Reeves Forth, MD, Last Rate: 12.5 mL/hr at 04/28/21 0451, 3.375 g at 04/28/21 0451   [COMPLETED] remdesivir 200 mg in sodium chloride 0.9% 250 mL IVPB, 200 mg, Intravenous, Once, Last Rate: 580 mL/hr at 04/27/21 1511, 200 mg at 04/27/21 1511 **FOLLOWED BY** remdesivir 100 mg in sodium chloride 0.9 % 100 mL IVPB, 100 mg, Intravenous, Daily, Darrick Penna, RPH   sodium chloride HYPERTONIC 3 % nebulizer solution 4 mL, 4 mL, Nebulization, Daily, Lanney Gins, Milton Sagona, MD, 4 mL at 04/28/21 0841    ALLERGIES   Patient has no known allergies.     REVIEW OF SYSTEMS    Review of Systems:  Gen:  Denies   fever, sweats, chills weigh loss  HEENT: Denies blurred vision, double vision, ear pain, eye pain, hearing loss, nose bleeds, sore throat Cardiac:  No dizziness, chest pain or heaviness, chest tightness,edema Resp:   Denies cough or sputum porduction, shortness of breath,wheezing, hemoptysis,  Gi: Denies swallowing difficulty, stomach pain, nausea or vomiting, diarrhea, constipation, bowel incontinence Gu:  Denies bladder incontinence, burning urine Ext:   Denies Joint pain, stiffness or swelling Skin: Denies  skin rash, easy bruising or bleeding or hives Endoc:  Denies polyuria, polydipsia , polyphagia or weight change Psych:   Denies depression, insomnia or hallucinations   Other:  All other systems negative   VS: BP 121/84 (BP Location: Left Arm)    Pulse 65    Temp 98.2 F (36.8 C)    Resp 18    Ht 5\' 11"  (1.803 m)    Wt 99.8 kg    SpO2 100%  BMI 30.68 kg/m      PHYSICAL EXAM    GENERAL:NAD, no fevers, chills, no weakness no fatigue HEAD: Normocephalic, atraumatic.  EYES: Pupils equal, round, reactive to light. Extraocular muscles intact. No scleral icterus.  MOUTH: Moist mucosal membrane. Dentition intact. No abscess noted.  EAR, NOSE, THROAT: Clear without exudates. No external lesions.  NECK: Supple. No thyromegaly. No nodules. No JVD.  PULMONARY: Decreased air entry worse on left  CARDIOVASCULAR: S1 and S2. Regular rate and rhythm. No murmurs, rubs, or gallops. No edema. Pedal pulses 2+ bilaterally.  GASTROINTESTINAL: No masses. Positive bowel sounds. No hepatosplenomegaly.  +surgical wound with dressing tender to touch MUSCULOSKELETAL: No swelling, clubbing, or edema. Range of motion full in all extremities.  NEUROLOGIC: Cranial nerves II through XII are intact. No gross focal neurological deficits. Sensation intact. Reflexes intact.  SKIN: No ulceration, lesions, rashes, or cyanosis. Skin warm and dry. Turgor intact.  PSYCHIATRIC: Mood, affect within normal limits. The  patient is awake, alert and oriented x 3. Insight, judgment intact.       IMAGING    CT CHEST WO CONTRAST  Result Date: 04/27/2021 CLINICAL DATA:  Difficulty breathing, infiltrate left lung EXAM: CT CHEST WITHOUT CONTRAST TECHNIQUE: Multidetector CT imaging of the chest was performed following the standard protocol without IV contrast. COMPARISON:  Chest radiograph done earlier today FINDINGS: Cardiovascular: There are few tiny coronary artery calcifications. Mediastinum/Nodes: No significant lymphadenopathy seen. Lungs/Pleura: There is 3.6 cm focal ground-glass density in the anterolateral aspect of right upper lobe. There are other smaller foci of ground-glass densities in the medial right mid lung fields and posterior segment of right upper lobe. There are small foci of ground-glass infiltrates in the right middle lobe and right lower lobe. There are linear densities in the posterior right lower lung fields. There is complete atelectasis of left lower lobe. There are filling defects in the left main bronchus. No significant focal abnormality is seen in the left upper lobe. There is minimal left pleural effusion. There is no pneumothorax. Upper Abdomen: Tip of enteric tube is seen in the stomach. There is small amount of pneumoperitoneum adjacent to the liver. Pneumoperitoneum was also seen in the previous CT abdomen done on 04/25/2021. Increased density in the lumen of gallbladder may be related to vicarious contrast excretion into the bile or suggest presence of sludge or tiny stones. There is no wall thickening in gallbladder. Musculoskeletal: Unremarkable. IMPRESSION: There is complete atelectasis in the left lower lobe, possibly due to mucous plugging in the central bronchi. Small left pleural effusion. There are patchy ground-glass infiltrates in the right lung suggesting possible multifocal pneumonia. Small amount of pneumoperitoneum is noted adjacent to the liver which may suggest recent abdominal  surgery or suggest bowel perforation. Other findings as described in the body of the report. Electronically Signed   By: Elmer Picker M.D.   On: 04/27/2021 11:01   CT ABDOMEN PELVIS W CONTRAST  Result Date: 04/26/2021 CLINICAL DATA:  RLQ abdominal pain (Age >= 14y) EXAM: CT ABDOMEN AND PELVIS WITH CONTRAST TECHNIQUE: Multidetector CT imaging of the abdomen and pelvis was performed using the standard protocol following bolus administration of intravenous contrast. CONTRAST:  142mL OMNIPAQUE IOHEXOL 300 MG/ML  SOLN COMPARISON:  None. FINDINGS: Lower chest: No acute abnormality. Hepatobiliary: No focal liver abnormality. No gallstones, gallbladder wall thickening, or pericholecystic fluid. No biliary dilatation. Pancreas: No focal lesion. Normal pancreatic contour. No surrounding inflammatory changes. No main pancreatic ductal dilatation. Spleen: Normal in size without focal  abnormality. Adrenals/Urinary Tract: No adrenal nodule bilaterally. Bilateral kidneys enhance symmetrically. No hydronephrosis. No hydroureter. The urinary bladder is unremarkable. Stomach/Bowel: Stomach is within normal limits. No evidence of bowel wall thickening or dilatation. No pneumatosis. Scattered colonic diverticulosis. Suggestion of appendectomy. Vascular/Lymphatic: No definite portal venous or mesenteric venous gas. The main portal, splenic, superior mesenteric veins are patent. No abdominal aorta or iliac aneurysm. Mild atherosclerotic plaque of the aorta and its branches. No abdominal, pelvic, or inguinal lymphadenopathy. Reproductive: Prostate is unremarkable. Other: Small volume free fluid and small volume pneumoperitoneum. Query origin of pneumoperitoneum and free fluid from a perforation involving the small bowel or rectum (5:43). No organized fluid collection. Musculoskeletal: Small fat containing umbilical hernia with an abdominal defect of 2.1 cm. Small fat containing left spigelian hernia just inferior to the level of  the umbilicus with an abdominal defect of 0.9 cm. Possible tiny fat containing left inguinal hernia. No suspicious lytic or blastic osseous lesions. No acute displaced fracture. Multilevel degenerative changes of the spine. IMPRESSION: 1. Small volume pneumoperitoneum and small volume ascites likely originating from a bowel perforation. Likely origin of perforation includes the small bowel or rectum in the anterior lower abdomen/pelvis. 2. Scattered colonic diverticulosis with no acute diverticulitis. 3. Small fat containing umbilical hernia with an abdominal defect of 2.1 cm. Small fat containing left spigelian hernia just inferior to the level of the umbilicus with an abdominal defect of 0.9 cm. Possible tiny fat containing left inguinal hernia. These results were called by telephone at the time of interpretation on 04/25/2021 at 11:57 pm to provider Dr. Leonides Schanz, who verbally acknowledged these results. Electronically Signed   By: Iven Finn M.D.   On: 04/26/2021 00:03   DG Chest Port 1 View  Result Date: 04/28/2021 CLINICAL DATA:  COVID positive patient. Follow-up left atelectasis. Clinical symptoms improving. EXAM: PORTABLE CHEST 1 VIEW COMPARISON:  04/27/2021 and older exams. FINDINGS: There has been a significant interval improvement in lung aeration, with most of the previously seen left mid to lower lung opacity resolving. There are subtle residual areas of opacity bilaterally. No new lung abnormalities. No convincing pleural effusion.  No pneumothorax. Cardiac silhouette is normal in size. Normal mediastinal and hilar contours. Nasal/orogastric tube is stable, tip in the mid stomach. IMPRESSION: 1. Significant interval improvement with near complete resolution of the left mid to lower lung opacities. Electronically Signed   By: Lajean Manes M.D.   On: 04/28/2021 10:12   DG Chest Port 1 View  Result Date: 04/27/2021 CLINICAL DATA:  56 year old male positive COVID-19. Shortness of breath. EXAM:  PORTABLE CHEST 1 VIEW COMPARISON:  04/26/2021 portable chest and earlier. FINDINGS: Portable AP semi upright view at 0543 hours. Stable enteric tube. Mildly lower lung volumes and interval substantial opacification of the left medial and lower lung. Some left hilar air bronchograms. Some leftward shift of the mediastinum. Visible mediastinal contours remain normal. Obscured left hemidiaphragm. No pneumothorax. Right lung appears negative. No pleural effusion is evident. No acute osseous abnormality identified. IMPRESSION: 1. Moderate collapse and/or consolidation of the left lung since 04/26/2021. 2. Negative right lung.  Stable enteric tube. Electronically Signed   By: Genevie Ann M.D.   On: 04/27/2021 06:43   DG Chest Port 1 View  Result Date: 04/26/2021 CLINICAL DATA:  Short of breath, COVID-19 positive EXAM: PORTABLE CHEST 1 VIEW COMPARISON:  11/20/2018, 04/25/2021 FINDINGS: Single frontal view of the chest demonstrates enteric catheter passing below diaphragm tip excluded by collimation. Cardiac silhouette is stable. Increased  consolidation at the left lung base with associated volume loss consistent with atelectasis. Small left pleural effusion is not excluded. No pneumothorax. No acute bony abnormalities. IMPRESSION: 1. Developing left basilar consolidation and associated volume loss, consistent with atelectasis. 2. Enteric catheter, tip excluded by collimation. Electronically Signed   By: Randa Ngo M.D.   On: 04/26/2021 15:18      ASSESSMENT/PLAN     Acute hypoxemic respiratory failure Acute COVID19 infection -Remdesevir antiviral - pharmacy protocol 5 d -vitamin C -zinc -decadron 6mg  IV daily  -encourage to use IS and Acapella device for bronchopulmonary hygiene when able -d/c hepatotoxic medications while on remdesevir -supportive care with ICU telemetry monitoring -PT/OT when possible -procalcitonin, CRP and ferritin trending  Complete left atelectasis    Hypertonic saline  nebulized 3% 50ml once daily   -incentive spirometry    Thank you for allowing me to participate in the care of this patient.  Total face to face encounter time for this patient visit was >45 min. >50% of the time was  spent in counseling and coordination of care.   Patient/Family are satisfied with care plan and all questions have been answered.  This document was prepared using Dragon voice recognition software and may include unintentional dictation errors.     Ottie Glazier, M.D.  Division of [facility]

## 2021-04-29 DIAGNOSIS — K631 Perforation of intestine (nontraumatic): Secondary | ICD-10-CM | POA: Diagnosis not present

## 2021-04-29 LAB — HEPATIC FUNCTION PANEL
ALT: 15 U/L (ref 0–44)
AST: 11 U/L — ABNORMAL LOW (ref 15–41)
Albumin: 2.5 g/dL — ABNORMAL LOW (ref 3.5–5.0)
Alkaline Phosphatase: 40 U/L (ref 38–126)
Bilirubin, Direct: 0.2 mg/dL (ref 0.0–0.2)
Indirect Bilirubin: 0.6 mg/dL (ref 0.3–0.9)
Total Bilirubin: 0.8 mg/dL (ref 0.3–1.2)
Total Protein: 5.6 g/dL — ABNORMAL LOW (ref 6.5–8.1)

## 2021-04-29 LAB — BASIC METABOLIC PANEL
Anion gap: 6 (ref 5–15)
BUN: 27 mg/dL — ABNORMAL HIGH (ref 6–20)
CO2: 24 mmol/L (ref 22–32)
Calcium: 8.4 mg/dL — ABNORMAL LOW (ref 8.9–10.3)
Chloride: 106 mmol/L (ref 98–111)
Creatinine, Ser: 0.81 mg/dL (ref 0.61–1.24)
GFR, Estimated: >60 mL/min (ref >60)
Glucose, Bld: 248 mg/dL — ABNORMAL HIGH (ref 70–99)
Potassium: 4.1 mmol/L (ref 3.5–5.1)
Sodium: 136 mmol/L (ref 135–145)

## 2021-04-29 LAB — GLUCOSE, CAPILLARY
Glucose-Capillary: 179 mg/dL — ABNORMAL HIGH (ref 70–99)
Glucose-Capillary: 168 mg/dL — ABNORMAL HIGH (ref 70–99)
Glucose-Capillary: 236 mg/dL — ABNORMAL HIGH (ref 70–99)
Glucose-Capillary: 261 mg/dL — ABNORMAL HIGH (ref 70–99)

## 2021-04-29 LAB — CBC
HCT: 40.1 % (ref 39.0–52.0)
Hemoglobin: 13.9 g/dL (ref 13.0–17.0)
MCH: 32 pg (ref 26.0–34.0)
MCHC: 34.7 g/dL (ref 30.0–36.0)
MCV: 92.2 fL (ref 80.0–100.0)
Platelets: 171 10*3/uL (ref 150–400)
RBC: 4.35 MIL/uL (ref 4.22–5.81)
RDW: 11.9 % (ref 11.5–15.5)
WBC: 10.7 10*3/uL — ABNORMAL HIGH (ref 4.0–10.5)
nRBC: 0 % (ref 0.0–0.2)

## 2021-04-29 LAB — AEROBIC/ANAEROBIC CULTURE W GRAM STAIN (SURGICAL/DEEP WOUND): Gram Stain: NONE SEEN

## 2021-04-29 LAB — PHOSPHORUS: Phosphorus: 2.7 mg/dL (ref 2.5–4.6)

## 2021-04-29 LAB — MAGNESIUM: Magnesium: 2.1 mg/dL (ref 1.7–2.4)

## 2021-04-29 MED ORDER — AZITHROMYCIN 250 MG PO TABS
500.0000 mg | ORAL_TABLET | Freq: Every day | ORAL | Status: AC
  Administered 2021-04-29 – 2021-05-01 (×3): 500 mg via ORAL
  Filled 2021-04-29 (×3): qty 2

## 2021-04-29 MED ORDER — PANTOPRAZOLE SODIUM 40 MG PO TBEC
40.0000 mg | DELAYED_RELEASE_TABLET | Freq: Every day | ORAL | Status: DC
  Administered 2021-04-29 – 2021-05-02 (×4): 40 mg via ORAL
  Filled 2021-04-29 (×4): qty 1

## 2021-04-29 MED ORDER — AZITHROMYCIN 250 MG PO TABS: 500.0000 mg | ORAL_TABLET | Freq: Every day | ORAL | Status: DC

## 2021-04-29 NOTE — Progress Notes (Signed)
[facility] Progress Note  Patient: Timothy Cameron    AQT:[id number]  DOA: 04/26/2021     Date of Service: the patient was seen and examined on 04/29/2021  Chief Complaint  Patient presents with   Abdominal Pain   Brief hospital course: KREG EARHART is a 56 y.o. 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  Patient is being taken to the OR at the time of this consultation and denies complaints outside of presenting symptoms of right-sided lower quadrant pain and nausea.  He denies a history of CAD, CHF, A. fib, syncope, seizures, stroke, COPD or OSA.  He denies cough or shortness of breath and at baseline is good exercise tolerance  Assessment and Plan:  Bowel perforation (HCC) S/p ex lap, JP drain intact Continue Zosyn for peritonitis NPO again could not tolerate clear liquid diet, abdomen was distended and still awaiting for return of bowel functions.   Continue IV fluid for hydration -Management per surgery    COVID-19 viral infection positive, incidental finding CXR shows possible left lung collapse CT chest: complete atelectasis in the left lower lobe, possibly due to mucous plugging in the central bronchi. Small left pleuraleffusion. There are patchy ground-glass infiltrates in the right lung suggesting possible multifocal pneumonia Started remdesivir for 5 days and Decadron IV for 10 days Started Mucinex 600 p.o. twice daily Continue incentive spirometry and flutter valve Continue chest PT Pulmonary critical care consulted for possible bronchoscopy Completed left atelectasis, hypertonic saline nebulized and instead respiratory ordered by [facility] 1/7 CXR Significant interval improvement with near complete resolution of the left mid to lower lung opacities   Diabetes (HCC) - Sliding scale insulin coverage   HTN (hypertension) -  Hydralazine IV as needed while NPO   HLD (hyperlipidemia) - Hold meds until oral route established  Body mass index is 30.68 kg/m.  Interventions:     Diet: NPO, advance per primary team General surgery DVT Prophylaxis: Subcutaneous Lovenox   Advance goals of care discussion: Full code  Family Communication: family was NOT present at bedside, at the time of interview.  The pt provided permission to discuss medical plan with the family. Opportunity was given to ask question and all questions were answered satisfactorily.   Disposition:  Pt is from Home, admitted with bowel perforation for ex lap under general surgery, still n.p.o., NG tube with intermittent suction, which precludes a safe discharge. Discharge to home, when clinically stable.  Subjective: No significant overnight events, patient breathing is much better now, supplemental O2 admission has been off, saturating well on room air.  Abdominal pain is well controlled with medications.  Patient could not tolerate clinical diet and abdomen was distended so patient will be back on NG tube with intermittent suction and n.p.o. until bowel functions return.   Physical Exam: General:  alert oriented to time, place, and person.  Appear in moderate distress, affect appropriate Eyes: PERRLA ENT: Oral Mucosa Clear, moist  Neck: no JVD,  Cardiovascular: S1 and S2 Present, no Murmur,  Respiratory: normal respiratory effort, mild crackles bibasilar area, no significant wheezing appreciated.   Abdomen: Bowel sounds sluggish, soft and generalized tenderness, colostomy intact, JP drain intact, dressing soaked with dried blood, no active bleeding or discharge. Skin: No rashes Extremities: No pedal edema, no calf tenderness Neurologic: without any new focal findings Gait not checked due to patient safety concerns  Vitals:  04/28/21 2012 04/29/21 0427 04/29/21 0813 04/29/21 0859  BP: 140/84 139/85  (!) 150/96  Pulse: 70 (!) 58  (!) 59   Resp: 20 20  16   Temp: 98.9 F (37.2 C) 98 F (36.7 C)  97.8 F (36.6 C)  TempSrc:      SpO2: 96% 98% 98% 98%  Weight:      Height:        Intake/Output Summary (Last 24 hours) at 04/29/2021 1338 Last data filed at 04/29/2021 06/27/2021 Gross per 24 hour  Intake 1257.65 ml  Output 700 ml  Net 557.65 ml   Filed Weights   04/25/21 2222  Weight: 99.8 kg    Data Reviewed: I have personally reviewed and interpreted daily labs, tele strips, imagings as discussed above. I reviewed all nursing notes, pharmacy notes, vitals, pertinent old records I have discussed plan of care as described above with RN and patient/family.  CBC: Recent Labs  Lab 04/25/21 2232 04/27/21 0500 04/28/21 0604 04/29/21 0504  WBC 18.5* 14.5* 13.5* 10.7*  NEUTROABS 15.8*  --   --   --   HGB 18.6* 16.1 14.7 13.9  HCT 52.2* 47.1 41.8 40.1  MCV 91.3 92.5 93.5 92.2  PLT 216 139* 142* 171   Basic Metabolic Panel: Recent Labs  Lab 04/25/21 2232 04/27/21 0500 04/28/21 0604 04/29/21 0504  NA 133* 136 134* 136  K 3.8 3.7 4.1 4.1  CL 100 106 104 106  CO2 21* 24 21* 24  GLUCOSE 280* 187* 206* 248*  BUN 16 18 27* 27*  CREATININE 1.28* 0.80 0.75 0.81  CALCIUM 9.0 8.0* 8.2* 8.4*  MG  --  2.0 2.1 2.1  PHOS  --  2.5 3.4 2.7    Studies: No results found.  Scheduled Meds:  albuterol  2 puff Inhalation Q6H   dexamethasone (DECADRON) injection  6 mg Intravenous Q24H   enoxaparin (LOVENOX) injection  0.5 mg/kg Subcutaneous Q24H   gabapentin  300 mg Oral BID   guaiFENesin  600 mg Oral BID   insulin aspart  0-15 Units Subcutaneous TID WC   ipratropium  2 puff Inhalation Q6H   ketorolac  30 mg Intravenous Q6H   pantoprazole (PROTONIX) IV  40 mg Intravenous QHS   sodium chloride HYPERTONIC  4 mL Nebulization Daily   Continuous Infusions:  sodium chloride 50 mL/hr at 04/28/21 0311   azithromycin Stopped (04/28/21 1900)   piperacillin-tazobactam (ZOSYN)  IV 3.375 g (04/29/21 0552)   remdesivir 100 mg in NS  100 mL 100 mg (04/29/21 0955)   PRN Meds: hydrALAZINE, HYDROcodone-acetaminophen, HYDROmorphone (DILAUDID) injection, ondansetron **OR** ondansetron (ZOFRAN) IV  Time spent: 35 minutes  Author: 06/27/21. MD [facility] 04/29/2021 1:38 PM  To reach On-call, see care teams to locate the attending and reach out to them via [email or web address]. If 7PM-7AM, please contact night-coverage If you still have difficulty reaching the attending provider, please page the [facility] (Director on Call) for [facility] on amion for assistance.

## 2021-04-29 NOTE — Progress Notes (Signed)
Patient ID: VERBON ALSBROOKS, male   DOB: 12-Aug-1965, 56 y.o.   MRN: [id number]     [place] [facility] Day(s): 3.   Interval History: Patient seen and examined, no acute events or new complaints overnight. Patient reports feeling tight in the abdomen.  Also complaining of hiccups.  Pain continues to slowly improve.  Denies any fever or chills.  Denies passing gas through the ileostomy.  Vital signs in last 24 hours: [min-max] current  Temp:  [97.8 F (36.6 C)-98.9 F (37.2 C)] 97.8 F (36.6 C) (01/08 0859) Pulse Rate:  [58-70] 59 (01/08 0859) Resp:  [16-20] 16 (01/08 0859) BP: (133-150)/(84-96) 150/96 (01/08 0859) SpO2:  [95 %-98 %] 98 % (01/08 0859) FiO2 (%):  [21 %] 21 % (01/08 0813)     Height: 5\' 11"  (180.3 cm) Weight: 99.8 kg BMI (Calculated): 30.7   Physical Exam:  Constitutional: alert, cooperative and no distress  Respiratory: breathing non-labored at rest  Cardiovascular: regular rate and sinus rhythm  Gastrointestinal: soft, mild-tender, and distended  Labs:  CBC Latest Ref Rng & Units 04/29/2021 04/28/2021 04/27/2021  WBC 4.0 - 10.5 K/uL 10.7(H) 13.5(H) 14.5(H)  Hemoglobin 13.0 - 17.0 g/dL 13.9 14.7 16.1  Hematocrit 39.0 - 52.0 % 40.1 41.8 47.1  Platelets 150 - 400 K/uL 171 142(L) 139(L)   CMP Latest Ref Rng & Units 04/29/2021 04/28/2021 04/27/2021  Glucose 70 - 99 mg/dL 248(H) 206(H) 187(H)  BUN 6 - 20 mg/dL 27(H) 27(H) 18  Creatinine 0.61 - 1.24 mg/dL 0.81 0.75 0.80  Sodium 135 - 145 mmol/L 136 134(L) 136  Potassium 3.5 - 5.1 mmol/L 4.1 4.1 3.7  Chloride 98 - 111 mmol/L 106 104 106  CO2 22 - 32 mmol/L 24 21(L) 24  Calcium 8.9 - 10.3 mg/dL 8.4(L) 8.2(L) 8.0(L)  Total Protein 6.5 - 8.1 g/dL 5.6(L) 5.9(L) -  Total Bilirubin 0.3 - 1.2 mg/dL 0.8 1.2 -  Alkaline Phos 38 - 126 U/L 40 40 -  AST 15 - 41 U/L 11(L) 15 -  ALT 0 - 44 U/L 15 16 -    Imaging studies: No new pertinent imaging studies   Assessment/Plan:  56 y.o. male with perforated diverticulitis 3  Day Post-Op s/p partial colectomy, appendectomy, laparotomy, complicated by pertinent comorbidities including uncontrolled diabetes, hypertension.  Patient continues to recover slowly.  Today was found with postoperative ileus.  This is expected after this type of surgery with severe peritonitis.  I put his NGT back to suction.  We will keep him n.p.o. again with ice chips and sips with meds.  Encouraged the patient to get out of bed.  The wound is dry and clean.  No sign of infection at this moment.  Drainage is serosanguineous.  White blood cell continue trending down.  We will continue with IV antibiotic therapy.  Adequate hemoglobin.  Continue with uncontrolled diabetes.  Appreciate hospitalist and management of his diabetes.  Appreciated pulmonology management of respiratory symptoms.  Arnold Long, MD

## 2021-04-29 NOTE — Progress Notes (Signed)
PHARMACIST - PHYSICIAN COMMUNICATION   CONCERNING: IV to Oral Route Change Policy  RECOMMENDATION: This patient is receiving pantoprazole and azithromycin by the intravenous route.  Based on criteria approved by the Pharmacy and Therapeutics Committee, the intravenous medication(s) is/are being converted to the equivalent oral dose form(s).   DESCRIPTION: These criteria include for pantoprazole: The patient is eating (either orally or via tube) and/or has been taking other orally administered medications for a least 24 hours The patient has no evidence of active gastrointestinal bleeding or impaired GI absorption (gastrectomy, short bowel, patient on TNA or NPO).  These criteria include for azithromycin: Patient being treated for a respiratory tract infection, urinary tract infection, cellulitis or clostridium difficile associated diarrhea if on metronidazole The patient is not neutropenic and does not exhibit a GI malabsorption state The patient is eating (either orally or via tube) and/or has been taking other orally administered medications for a least 24 hours The patient is improving clinically and has a Tmax < 100.5  If you have questions about this conversion, please contact the Pharmacy Department    Rico Junker, [facility] 04/29/2021 1:48 PM

## 2021-04-29 NOTE — Progress Notes (Signed)
PULMONOLOGY         Date: 04/29/2021,   MRN# [id number] [facility] 1966-02-24     AdmissionWeight: 99.8 kg                 CurrentWeight: 99.8 kg   Referring physician: Dr Dwyane Dee   CHIEF COMPLAINT:   Left lung complete atelectasis with mucus plugging.    HISTORY OF PRESENT ILLNESS   This is a pleasant male with hx of DM , dyslipidemia, HTN, admitted with abd pain evening of 04/26/21.  He was noted to have signs and symptoms of sepsis with circulatory shock responsive to IVF.  He had CT imaging abdomen which revealed perforated viscus. He was seen by surgery and is s/p operative intervention with ex lap and sigmoid colectomy and appendectomy.   04/28/21- patient improved clinically. Continue current care plan. No need for bronchoscopy at this time. Left sided infiltrate/atelectasis as below.     Narrative & Impression  CLINICAL DATA:  COVID positive patient. Follow-up left atelectasis. Clinical symptoms improving.   EXAM: PORTABLE CHEST 1 VIEW   COMPARISON:  04/27/2021 and older exams.   FINDINGS: There has been a significant interval improvement in lung aeration, with most of the previously seen left mid to lower lung opacity resolving. There are subtle residual areas of opacity bilaterally. No new lung abnormalities.   No convincing pleural effusion.  No pneumothorax.   Cardiac silhouette is normal in size. Normal mediastinal and hilar contours.   Nasal/orogastric tube is stable, tip in the mid stomach.   IMPRESSION: 1. Significant interval improvement with near complete resolution of the left mid to lower lung opacities.     Electronically Signed   By: Lajean Manes M.D.   On: 04/28/2021 10:12      04/29/21- patient doing well on room air. Continue bronchopulmonary hygiene and PT.     PAST MEDICAL HISTORY   Past Medical History:  Diagnosis Date   Diabetes ([place])    High cholesterol    Hypertension      SURGICAL HISTORY   Past  Surgical History:  Procedure Laterality Date   ABDOMINAL SURGERY     APPENDECTOMY  04/26/2021   Procedure: APPENDECTOMY;  Surgeon: Herbert Pun, MD;  Location: [facility] ORS;  Service: General;;   COLON RESECTION SIGMOID  04/26/2021   Procedure: partial colectomy with anastomosis;  Surgeon: Herbert Pun, MD;  Location: [facility] ORS;  Service: General;;   COLONOSCOPY N/A 03/09/2016   Procedure: COLONOSCOPY;  Surgeon: Wilford Corner, MD;  Location: [facility] ENDOSCOPY;  Service: Endoscopy;  Laterality: N/A;   ILEOSTOMY  04/26/2021   Procedure: ILEOSTOMY Creation;  Surgeon: Herbert Pun, MD;  Location: [facility] ORS;  Service: General;;   INCISIONAL HERNIA REPAIR  04/26/2021   Procedure: HERNIA REPAIR INCISIONAL;  Surgeon: Herbert Pun, MD;  Location: [facility] ORS;  Service: General;;     FAMILY HISTORY   History reviewed. No pertinent family history.   SOCIAL HISTORY   Social History   Tobacco Use   Smoking status: Every Day    Types: Cigars    Last attempt to quit: 10/23/2017    Years since quitting: 3.5   Smokeless tobacco: Never  Vaping Use   Vaping Use: Never used  Substance Use Topics   Alcohol use: Yes    Alcohol/week: 30.0 standard drinks    Types: 30 Cans of beer per week   Drug use: Yes    Types: Cocaine, Marijuana     MEDICATIONS  Home Medication:    Current Medication:  Current Facility-Administered Medications:    0.9 %  sodium chloride infusion, , Intravenous, Continuous, Gillis SantaKumar, Dileep, MD, Last Rate: 50 mL/hr at 04/28/21 0311, Infusion Verify at 04/28/21 0311   albuterol (VENTOLIN HFA) 108 (90 Base) MCG/ACT inhaler 2 puff, 2 puff, Inhalation, Q6H, Gillis SantaKumar, Dileep, MD, 2 puff at 04/29/21 0947   azithromycin (ZITHROMAX) 500 mg in sodium chloride 0.9 % 250 mL IVPB, 500 mg, Intravenous, Q24H, Gillis SantaKumar, Dileep, MD, Stopping Infusion hung by another clincian at 04/28/21 1900   dexamethasone (DECADRON) injection 6 mg, 6 mg, Intravenous, Q24H, Gillis SantaKumar, Dileep,  MD, 6 mg at 04/28/21 1301   enoxaparin (LOVENOX) injection 50 mg, 0.5 mg/kg, Subcutaneous, Q24H, Cintron-Diaz, Edgardo, MD, 50 mg at 04/29/21 0945   gabapentin (NEURONTIN) capsule 300 mg, 300 mg, Oral, BID, Carolan Shiverintron-Diaz, Edgardo, MD, 300 mg at 04/29/21 0946   guaiFENesin (MUCINEX) 12 hr tablet 600 mg, 600 mg, Oral, BID, Gillis SantaKumar, Dileep, MD, 600 mg at 04/29/21 0946   hydrALAZINE (APRESOLINE) injection 5 mg, 5 mg, Intravenous, Q4H PRN, Carolan Shiverintron-Diaz, Edgardo, MD   HYDROcodone-acetaminophen (NORCO/VICODIN) 5-325 MG per tablet 1-2 tablet, 1-2 tablet, Oral, Q4H PRN, Carolan Shiverintron-Diaz, Edgardo, MD, 2 tablet at 04/28/21 1612   HYDROmorphone (DILAUDID) injection 1 mg, 1 mg, Intravenous, Q4H PRN, Piscoya, Jose, MD, 1 mg at 04/29/21 1121   insulin aspart (novoLOG) injection 0-15 Units, 0-15 Units, Subcutaneous, TID WC, Carolan Shiverintron-Diaz, Edgardo, MD, 3 Units at 04/29/21 1241   ipratropium (ATROVENT HFA) inhaler 2 puff, 2 puff, Inhalation, Q6H, Gillis SantaKumar, Dileep, MD, 2 puff at 04/29/21 0947   ketorolac (TORADOL) 30 MG/ML injection 30 mg, 30 mg, Intravenous, Q6H, Cintron-Diaz, Edgardo, MD, 30 mg at 04/29/21 1241   ondansetron (ZOFRAN-ODT) disintegrating tablet 4 mg, 4 mg, Oral, Q6H PRN **OR** ondansetron (ZOFRAN) injection 4 mg, 4 mg, Intravenous, Q6H PRN, Hazle Quantintron-Diaz, Edgardo, MD   pantoprazole (PROTONIX) injection 40 mg, 40 mg, Intravenous, QHS, Cintron-Diaz, Edgardo, MD, 40 mg at 04/28/21 2101   piperacillin-tazobactam (ZOSYN) IVPB 3.375 g, 3.375 g, Intravenous, Q8H, Cintron-Diaz, Edgardo, MD, Last Rate: 12.5 mL/hr at 04/29/21 0552, 3.375 g at 04/29/21 0552   [COMPLETED] remdesivir 200 mg in sodium chloride 0.9% 250 mL IVPB, 200 mg, Intravenous, Once, Last Rate: 580 mL/hr at 04/27/21 1511, 200 mg at 04/27/21 1511 **FOLLOWED BY** remdesivir 100 mg in sodium chloride 0.9 % 100 mL IVPB, 100 mg, Intravenous, Daily, Selinda EonMoore, Anderson S, RPH, Last Rate: 200 mL/hr at 04/29/21 0955, 100 mg at 04/29/21 0955   sodium chloride HYPERTONIC 3  % nebulizer solution 4 mL, 4 mL, Nebulization, Daily, Vida RiggerAleskerov, Ivylynn Hoppes, MD, 4 mL at 04/29/21 0810    ALLERGIES   Patient has no known allergies.     REVIEW OF SYSTEMS    Review of Systems:  Gen:  Denies  fever, sweats, chills weigh loss  HEENT: Denies blurred vision, double vision, ear pain, eye pain, hearing loss, nose bleeds, sore throat Cardiac:  No dizziness, chest pain or heaviness, chest tightness,edema Resp:   Denies cough or sputum porduction, shortness of breath,wheezing, hemoptysis,  Gi: Denies swallowing difficulty, stomach pain, nausea or vomiting, diarrhea, constipation, bowel incontinence Gu:  Denies bladder incontinence, burning urine Ext:   Denies Joint pain, stiffness or swelling Skin: Denies  skin rash, easy bruising or bleeding or hives Endoc:  Denies polyuria, polydipsia , polyphagia or weight change Psych:   Denies depression, insomnia or hallucinations   Other:  All other systems negative   VS: BP (!) 150/96 (BP Location: Left Arm)  Pulse (!) 59    Temp 97.8 F (36.6 C)    Resp 16    Ht 5\' 11"  (1.803 m)    Wt 99.8 kg    SpO2 98%    BMI 30.68 kg/m      PHYSICAL EXAM    GENERAL:NAD, no fevers, chills, no weakness no fatigue HEAD: Normocephalic, atraumatic.  EYES: Pupils equal, round, reactive to light. Extraocular muscles intact. No scleral icterus.  MOUTH: Moist mucosal membrane. Dentition intact. No abscess noted.  EAR, NOSE, THROAT: Clear without exudates. No external lesions.  NECK: Supple. No thyromegaly. No nodules. No JVD.  PULMONARY: Decreased air entry worse on left  CARDIOVASCULAR: S1 and S2. Regular rate and rhythm. No murmurs, rubs, or gallops. No edema. Pedal pulses 2+ bilaterally.  GASTROINTESTINAL: No masses. Positive bowel sounds. No hepatosplenomegaly.  +surgical wound with dressing tender to touch MUSCULOSKELETAL: No swelling, clubbing, or edema. Range of motion full in all extremities.  NEUROLOGIC: Cranial nerves II through XII  are intact. No gross focal neurological deficits. Sensation intact. Reflexes intact.  SKIN: No ulceration, lesions, rashes, or cyanosis. Skin warm and dry. Turgor intact.  PSYCHIATRIC: Mood, affect within normal limits. The patient is awake, alert and oriented x 3. Insight, judgment intact.       IMAGING    CT CHEST WO CONTRAST  Result Date: 04/27/2021 CLINICAL DATA:  Difficulty breathing, infiltrate left lung EXAM: CT CHEST WITHOUT CONTRAST TECHNIQUE: Multidetector CT imaging of the chest was performed following the standard protocol without IV contrast. COMPARISON:  Chest radiograph done earlier today FINDINGS: Cardiovascular: There are few tiny coronary artery calcifications. Mediastinum/Nodes: No significant lymphadenopathy seen. Lungs/Pleura: There is 3.6 cm focal ground-glass density in the anterolateral aspect of right upper lobe. There are other smaller foci of ground-glass densities in the medial right mid lung fields and posterior segment of right upper lobe. There are small foci of ground-glass infiltrates in the right middle lobe and right lower lobe. There are linear densities in the posterior right lower lung fields. There is complete atelectasis of left lower lobe. There are filling defects in the left main bronchus. No significant focal abnormality is seen in the left upper lobe. There is minimal left pleural effusion. There is no pneumothorax. Upper Abdomen: Tip of enteric tube is seen in the stomach. There is small amount of pneumoperitoneum adjacent to the liver. Pneumoperitoneum was also seen in the previous CT abdomen done on 04/25/2021. Increased density in the lumen of gallbladder may be related to vicarious contrast excretion into the bile or suggest presence of sludge or tiny stones. There is no wall thickening in gallbladder. Musculoskeletal: Unremarkable. IMPRESSION: There is complete atelectasis in the left lower lobe, possibly due to mucous plugging in the central bronchi.  Small left pleural effusion. There are patchy ground-glass infiltrates in the right lung suggesting possible multifocal pneumonia. Small amount of pneumoperitoneum is noted adjacent to the liver which may suggest recent abdominal surgery or suggest bowel perforation. Other findings as described in the body of the report. Electronically Signed   By: Elmer Picker M.D.   On: 04/27/2021 11:01   CT ABDOMEN PELVIS W CONTRAST  Result Date: 04/26/2021 CLINICAL DATA:  RLQ abdominal pain (Age >= 14y) EXAM: CT ABDOMEN AND PELVIS WITH CONTRAST TECHNIQUE: Multidetector CT imaging of the abdomen and pelvis was performed using the standard protocol following bolus administration of intravenous contrast. CONTRAST:  133mL OMNIPAQUE IOHEXOL 300 MG/ML  SOLN COMPARISON:  None. FINDINGS: Lower chest: No acute abnormality.  Hepatobiliary: No focal liver abnormality. No gallstones, gallbladder wall thickening, or pericholecystic fluid. No biliary dilatation. Pancreas: No focal lesion. Normal pancreatic contour. No surrounding inflammatory changes. No main pancreatic ductal dilatation. Spleen: Normal in size without focal abnormality. Adrenals/Urinary Tract: No adrenal nodule bilaterally. Bilateral kidneys enhance symmetrically. No hydronephrosis. No hydroureter. The urinary bladder is unremarkable. Stomach/Bowel: Stomach is within normal limits. No evidence of bowel wall thickening or dilatation. No pneumatosis. Scattered colonic diverticulosis. Suggestion of appendectomy. Vascular/Lymphatic: No definite portal venous or mesenteric venous gas. The main portal, splenic, superior mesenteric veins are patent. No abdominal aorta or iliac aneurysm. Mild atherosclerotic plaque of the aorta and its branches. No abdominal, pelvic, or inguinal lymphadenopathy. Reproductive: Prostate is unremarkable. Other: Small volume free fluid and small volume pneumoperitoneum. Query origin of pneumoperitoneum and free fluid from a perforation  involving the small bowel or rectum (5:43). No organized fluid collection. Musculoskeletal: Small fat containing umbilical hernia with an abdominal defect of 2.1 cm. Small fat containing left spigelian hernia just inferior to the level of the umbilicus with an abdominal defect of 0.9 cm. Possible tiny fat containing left inguinal hernia. No suspicious lytic or blastic osseous lesions. No acute displaced fracture. Multilevel degenerative changes of the spine. IMPRESSION: 1. Small volume pneumoperitoneum and small volume ascites likely originating from a bowel perforation. Likely origin of perforation includes the small bowel or rectum in the anterior lower abdomen/pelvis. 2. Scattered colonic diverticulosis with no acute diverticulitis. 3. Small fat containing umbilical hernia with an abdominal defect of 2.1 cm. Small fat containing left spigelian hernia just inferior to the level of the umbilicus with an abdominal defect of 0.9 cm. Possible tiny fat containing left inguinal hernia. These results were called by telephone at the time of interpretation on 04/25/2021 at 11:57 pm to provider Dr. Leonides Schanz, who verbally acknowledged these results. Electronically Signed   By: Iven Finn M.D.   On: 04/26/2021 00:03   DG Chest Port 1 View  Result Date: 04/28/2021 CLINICAL DATA:  COVID positive patient. Follow-up left atelectasis. Clinical symptoms improving. EXAM: PORTABLE CHEST 1 VIEW COMPARISON:  04/27/2021 and older exams. FINDINGS: There has been a significant interval improvement in lung aeration, with most of the previously seen left mid to lower lung opacity resolving. There are subtle residual areas of opacity bilaterally. No new lung abnormalities. No convincing pleural effusion.  No pneumothorax. Cardiac silhouette is normal in size. Normal mediastinal and hilar contours. Nasal/orogastric tube is stable, tip in the mid stomach. IMPRESSION: 1. Significant interval improvement with near complete resolution of the  left mid to lower lung opacities. Electronically Signed   By: Lajean Manes M.D.   On: 04/28/2021 10:12   DG Chest Port 1 View  Result Date: 04/27/2021 CLINICAL DATA:  56 year old male positive COVID-19. Shortness of breath. EXAM: PORTABLE CHEST 1 VIEW COMPARISON:  04/26/2021 portable chest and earlier. FINDINGS: Portable AP semi upright view at 0543 hours. Stable enteric tube. Mildly lower lung volumes and interval substantial opacification of the left medial and lower lung. Some left hilar air bronchograms. Some leftward shift of the mediastinum. Visible mediastinal contours remain normal. Obscured left hemidiaphragm. No pneumothorax. Right lung appears negative. No pleural effusion is evident. No acute osseous abnormality identified. IMPRESSION: 1. Moderate collapse and/or consolidation of the left lung since 04/26/2021. 2. Negative right lung.  Stable enteric tube. Electronically Signed   By: Genevie Ann M.D.   On: 04/27/2021 06:43   DG Chest Port 1 View  Result Date: 04/26/2021 CLINICAL  DATA:  Short of breath, COVID-19 positive EXAM: PORTABLE CHEST 1 VIEW COMPARISON:  11/20/2018, 04/25/2021 FINDINGS: Single frontal view of the chest demonstrates enteric catheter passing below diaphragm tip excluded by collimation. Cardiac silhouette is stable. Increased consolidation at the left lung base with associated volume loss consistent with atelectasis. Small left pleural effusion is not excluded. No pneumothorax. No acute bony abnormalities. IMPRESSION: 1. Developing left basilar consolidation and associated volume loss, consistent with atelectasis. 2. Enteric catheter, tip excluded by collimation. Electronically Signed   By: Randa Ngo M.D.   On: 04/26/2021 15:18      ASSESSMENT/PLAN     Acute hypoxemic respiratory failure Acute COVID19 infection -Remdesevir antiviral - pharmacy protocol 5 d -vitamin C -zinc -decadron 6mg  IV daily  -encourage to use IS and Acapella device for bronchopulmonary  hygiene when able monitoring -PT/OT when possible -procalcitonin, CRP and ferritin trending   Complete left atelectasis    Hypertonic saline nebulized 3% 50ml once daily   -incentive spirometry  -improved substantially   Thank you for allowing me to participate in the care of this patient.   Patient/Family are satisfied with care plan and all questions have been answered.   This document was prepared using Dragon voice recognition software and may include unintentional dictation errors.     Ottie Glazier, M.D.  Division of [place]

## 2021-04-30 ENCOUNTER — Inpatient Hospital Stay: Payer: 59

## 2021-04-30 DIAGNOSIS — K631 Perforation of intestine (nontraumatic): Secondary | ICD-10-CM | POA: Diagnosis not present

## 2021-04-30 LAB — BASIC METABOLIC PANEL
Anion gap: 6 (ref 5–15)
BUN: 28 mg/dL — ABNORMAL HIGH (ref 6–20)
CO2: 26 mmol/L (ref 22–32)
Calcium: 8.2 mg/dL — ABNORMAL LOW (ref 8.9–10.3)
Chloride: 106 mmol/L (ref 98–111)
Creatinine, Ser: 0.84 mg/dL (ref 0.61–1.24)
GFR, Estimated: >60 mL/min (ref >60)
Glucose, Bld: 207 mg/dL — ABNORMAL HIGH (ref 70–99)
Potassium: 4 mmol/L (ref 3.5–5.1)
Sodium: 138 mmol/L (ref 135–145)

## 2021-04-30 LAB — HEPATIC FUNCTION PANEL
ALT: 15 U/L (ref 0–44)
AST: 12 U/L — ABNORMAL LOW (ref 15–41)
Albumin: 2.5 g/dL — ABNORMAL LOW (ref 3.5–5.0)
Alkaline Phosphatase: 38 U/L (ref 38–126)
Bilirubin, Direct: 0.2 mg/dL (ref 0.0–0.2)
Indirect Bilirubin: 0.6 mg/dL (ref 0.3–0.9)
Total Bilirubin: 0.8 mg/dL (ref 0.3–1.2)
Total Protein: 5.5 g/dL — ABNORMAL LOW (ref 6.5–8.1)

## 2021-04-30 LAB — GLUCOSE, CAPILLARY
Glucose-Capillary: 164 mg/dL — ABNORMAL HIGH (ref 70–99)
Glucose-Capillary: 139 mg/dL — ABNORMAL HIGH (ref 70–99)
Glucose-Capillary: 250 mg/dL — ABNORMAL HIGH (ref 70–99)
Glucose-Capillary: 276 mg/dL — ABNORMAL HIGH (ref 70–99)

## 2021-04-30 LAB — PHOSPHORUS: Phosphorus: 4.3 mg/dL (ref 2.5–4.6)

## 2021-04-30 LAB — SURGICAL PATHOLOGY

## 2021-04-30 LAB — CBC
HCT: 40.4 % (ref 39.0–52.0)
Hemoglobin: 14 g/dL (ref 13.0–17.0)
MCH: 31.7 pg (ref 26.0–34.0)
MCHC: 34.7 g/dL (ref 30.0–36.0)
MCV: 91.6 fL (ref 80.0–100.0)
Platelets: 186 10*3/uL (ref 150–400)
RBC: 4.41 MIL/uL (ref 4.22–5.81)
RDW: 11.9 % (ref 11.5–15.5)
WBC: 6.9 10*3/uL (ref 4.0–10.5)
nRBC: 0 % (ref 0.0–0.2)

## 2021-04-30 LAB — MAGNESIUM: Magnesium: 1.9 mg/dL (ref 1.7–2.4)

## 2021-04-30 MED ORDER — ATORVASTATIN CALCIUM 20 MG PO TABS
20.0000 mg | ORAL_TABLET | Freq: Every day | ORAL | Status: DC
  Administered 2021-04-30 – 2021-05-02 (×3): 20 mg via ORAL
  Filled 2021-04-30 (×3): qty 1

## 2021-04-30 NOTE — Progress Notes (Signed)
Patient ID: NICKHOLAS MOUND, male   DOB: Feb 09, 1966, 56 y.o.   MRN: [id number]     [place] Hospital Day(s): 4.   Interval History: Patient seen and examined, no acute events or new complaints overnight. Patient reports slowly feeling better but continues with some tightness when drinking liquids.  Pain slowly getting under better control.  Denies significant coughing.  Vital signs in last 24 hours: [min-max] current  Temp:  [98 F (36.7 C)-98.4 F (36.9 C)] 98.4 F (36.9 C) (01/09 1757) Pulse Rate:  [60-62] 62 (01/09 1757) Resp:  [16-20] 16 (01/09 1757) BP: (150-154)/(91-105) 154/105 (01/09 1757) SpO2:  [98 %] 98 % (01/09 1757)     Height: 5\' 11"  (180.3 cm) Weight: 99.8 kg BMI (Calculated): 30.7   Physical Exam:  Constitutional: alert, cooperative and no distress  Respiratory: breathing non-labored at rest  Cardiovascular: regular rate and sinus rhythm  Gastrointestinal: soft, non-tender, and distended  Labs:  CBC Latest Ref Rng & Units 04/30/2021 04/29/2021 04/28/2021  WBC 4.0 - 10.5 K/uL 6.9 10.7(H) 13.5(H)  Hemoglobin 13.0 - 17.0 g/dL 14.0 13.9 14.7  Hematocrit 39.0 - 52.0 % 40.4 40.1 41.8  Platelets 150 - 400 K/uL 186 171 142(L)   CMP Latest Ref Rng & Units 04/30/2021 04/29/2021 04/28/2021  Glucose 70 - 99 mg/dL 207(H) 248(H) 206(H)  BUN 6 - 20 mg/dL 28(H) 27(H) 27(H)  Creatinine 0.61 - 1.24 mg/dL 0.84 0.81 0.75  Sodium 135 - 145 mmol/L 138 136 134(L)  Potassium 3.5 - 5.1 mmol/L 4.0 4.1 4.1  Chloride 98 - 111 mmol/L 106 106 104  CO2 22 - 32 mmol/L 26 24 21(L)  Calcium 8.9 - 10.3 mg/dL 8.2(L) 8.4(L) 8.2(L)  Total Protein 6.5 - 8.1 g/dL 5.5(L) 5.6(L) 5.9(L)  Total Bilirubin 0.3 - 1.2 mg/dL 0.8 0.8 1.2  Alkaline Phos 38 - 126 U/L 38 40 40  AST 15 - 41 U/L 12(L) 11(L) 15  ALT 0 - 44 U/L 15 15 16     Imaging studies: No new pertinent imaging studies   Assessment/Plan:  56 y.o. male with perforated diverticulitis 4 Day Post-Op s/p partial colectomy, appendectomy,  laparotomy, complicated by pertinent comorbidities including uncontrolled diabetes, hypertension.  She continues to recover slowly but adequately.  Continue with intermittent postoperative ileus.  Still with some gas and bilious output but still moving slower than what he can drink.  Every time that he tried to drinks he get tighter and distended.  If he is placed back to suction he started passing gas and bilious output with improvement of abdominal distention.  I tried to put him in clear liquid this morning but he had recurrent ileus.  Patient back to low intermittent suction.  We will continue with this trials until patient has a better bowel function.  Pain better controlled.  However cell count is normal.  No fever.  Adequate drain output.  Patient having adequate urine output.  Appreciate hospitalist and pulmonology evaluation and management of medical comorbidities.  Arnold Long, MD

## 2021-04-30 NOTE — Progress Notes (Signed)
PULMONOLOGY         Date: 04/30/2021,   MRN# [id number] Trandon Longtin [facility] December 23, 1965     AdmissionWeight: 56 kg                 CurrentWeight: 56 kg   Referring physician: Dr Dwyane Dee   CHIEF COMPLAINT:   Left lung complete atelectasis with mucus plugging.    HISTORY OF PRESENT ILLNESS   This is a pleasant male with hx of DM , dyslipidemia, HTN, admitted with abd pain evening of 04/26/21.  He was noted to have signs and symptoms of sepsis with circulatory shock responsive to IVF.  He had CT imaging abdomen which revealed perforated viscus. He was seen by surgery and is s/p operative intervention with ex lap and sigmoid colectomy and appendectomy.   04/28/21- patient improved clinically. Continue current care plan. No need for bronchoscopy at this time. Left sided infiltrate/atelectasis as below.     Narrative & Impression  CLINICAL DATA:  COVID positive patient. Follow-up left atelectasis. Clinical symptoms improving.   EXAM: PORTABLE CHEST 1 VIEW   COMPARISON:  04/27/2021 and older exams.   FINDINGS: There has been a significant interval improvement in lung aeration, with most of the previously seen left mid to lower lung opacity resolving. There are subtle residual areas of opacity bilaterally. No new lung abnormalities.   No convincing pleural effusion.  No pneumothorax.   Cardiac silhouette is normal in size. Normal mediastinal and hilar contours.   Nasal/orogastric tube is stable, tip in the mid stomach.   IMPRESSION: 1. Significant interval improvement with near complete resolution of the left mid to lower lung opacities.     Electronically Signed   By: Lajean Manes M.D.   On: 04/28/2021 10:12      04/29/21- patient doing well on room air. Continue bronchopulmonary hygiene and PT.     04/30/21- patient is very appreciative of improvement.  He reports resolution of cough and left chest discomfort. He is on room air, reports overall clinical  improvement.  He found help at home post DC.  He is walking with less pain/dyspnea.  Repeat CXR today in process.   PAST MEDICAL HISTORY   Past Medical History:  Diagnosis Date   Diabetes ([place])    High cholesterol    Hypertension      SURGICAL HISTORY   Past Surgical History:  Procedure Laterality Date   ABDOMINAL SURGERY     APPENDECTOMY  04/26/2021   Procedure: APPENDECTOMY;  Surgeon: Herbert Pun, MD;  Location: [facility] ORS;  Service: General;;   COLON RESECTION SIGMOID  04/26/2021   Procedure: partial colectomy with anastomosis;  Surgeon: Herbert Pun, MD;  Location: [facility] ORS;  Service: General;;   COLONOSCOPY N/A 03/09/2016   Procedure: COLONOSCOPY;  Surgeon: Wilford Corner, MD;  Location: [facility] ENDOSCOPY;  Service: Endoscopy;  Laterality: N/A;   ILEOSTOMY  04/26/2021   Procedure: ILEOSTOMY Creation;  Surgeon: Herbert Pun, MD;  Location: [facility] ORS;  Service: General;;   INCISIONAL HERNIA REPAIR  04/26/2021   Procedure: HERNIA REPAIR INCISIONAL;  Surgeon: Herbert Pun, MD;  Location: [facility] ORS;  Service: General;;     FAMILY HISTORY   History reviewed. No pertinent family history.   SOCIAL HISTORY   Social History   Tobacco Use   Smoking status: Every Day    Types: Cigars    Last attempt to quit: 10/23/2017    Years since quitting: 3.5   Smokeless tobacco: Never  Vaping Use  Vaping Use: Never used  Substance Use Topics   Alcohol use: Yes    Alcohol/week: 30.0 standard drinks    Types: 30 Cans of beer per week   Drug use: Yes    Types: Cocaine, Marijuana     MEDICATIONS    Home Medication:    Current Medication:  Current Facility-Administered Medications:    0.9 %  sodium chloride infusion, , Intravenous, Continuous, Val Riles, MD, Last Rate: 50 mL/hr at 04/30/21 0258, Infusion Verify at 04/30/21 0258   albuterol (VENTOLIN HFA) 108 (90 Base) MCG/ACT inhaler 2 puff, 2 puff, Inhalation, Q6H, Val Riles, MD, 2 puff at  04/30/21 0248   azithromycin (ZITHROMAX) tablet 500 mg, 500 mg, Oral, Daily, Cintron-Diaz, Reeves Forth, MD, 500 mg at 04/29/21 1507   dexamethasone (DECADRON) injection 6 mg, 6 mg, Intravenous, Q24H, Val Riles, MD, 6 mg at 04/29/21 1506   enoxaparin (LOVENOX) injection 50 mg, 0.5 mg/kg, Subcutaneous, Q24H, Cintron-Diaz, Edgardo, MD, 50 mg at 04/29/21 0945   gabapentin (NEURONTIN) capsule 300 mg, 300 mg, Oral, BID, Herbert Pun, MD, 300 mg at 04/29/21 2027   guaiFENesin (MUCINEX) 12 hr tablet 600 mg, 600 mg, Oral, BID, Val Riles, MD, 600 mg at 04/29/21 2027   hydrALAZINE (APRESOLINE) injection 5 mg, 5 mg, Intravenous, Q4H PRN, Herbert Pun, MD   HYDROcodone-acetaminophen (NORCO/VICODIN) 5-325 MG per tablet 1-2 tablet, 1-2 tablet, Oral, Q4H PRN, Herbert Pun, MD, 2 tablet at 04/28/21 1612   HYDROmorphone (DILAUDID) injection 1 mg, 1 mg, Intravenous, Q4H PRN, Piscoya, Jose, MD, 1 mg at 04/30/21 0249   insulin aspart (novoLOG) injection 0-15 Units, 0-15 Units, Subcutaneous, TID WC, Herbert Pun, MD, 5 Units at 04/29/21 1755   ipratropium (ATROVENT HFA) inhaler 2 puff, 2 puff, Inhalation, Q6H, Val Riles, MD, 2 puff at 04/30/21 0248   ketorolac (TORADOL) 30 MG/ML injection 30 mg, 30 mg, Intravenous, Q6H, Cintron-Diaz, Edgardo, MD, 30 mg at 04/30/21 0451   ondansetron (ZOFRAN-ODT) disintegrating tablet 4 mg, 4 mg, Oral, Q6H PRN **OR** ondansetron (ZOFRAN) injection 4 mg, 4 mg, Intravenous, Q6H PRN, Cintron-Diaz, Edgardo, MD   pantoprazole (PROTONIX) EC tablet 40 mg, 40 mg, Oral, QHS, Cintron-Diaz, Edgardo, MD, 40 mg at 04/29/21 2027   piperacillin-tazobactam (ZOSYN) IVPB 3.375 g, 3.375 g, Intravenous, Q8H, Cintron-Diaz, Edgardo, MD, Last Rate: 12.5 mL/hr at 04/30/21 0454, 3.375 g at 04/30/21 0454   [COMPLETED] remdesivir 200 mg in sodium chloride 0.9% 250 mL IVPB, 200 mg, Intravenous, Once, Last Rate: 580 mL/hr at 04/27/21 1511, 200 mg at 04/27/21 1511 **FOLLOWED  BY** remdesivir 100 mg in sodium chloride 0.9 % 100 mL IVPB, 100 mg, Intravenous, Daily, Darrick Penna, [facility], Stopped at 04/29/21 1025    ALLERGIES   Patient has no known allergies.     REVIEW OF SYSTEMS    Review of Systems:  Gen:  Denies  fever, sweats, chills weigh loss  HEENT: Denies blurred vision, double vision, ear pain, eye pain, hearing loss, nose bleeds, sore throat Cardiac:  No dizziness, chest pain or heaviness, chest tightness,edema Resp:   Denies cough or sputum porduction, shortness of breath,wheezing, hemoptysis,  Gi: Denies swallowing difficulty, stomach pain, nausea or vomiting, diarrhea, constipation, bowel incontinence Gu:  Denies bladder incontinence, burning urine Ext:   Denies Joint pain, stiffness or swelling Skin: Denies  skin rash, easy bruising or bleeding or hives Endoc:  Denies polyuria, polydipsia , polyphagia or weight change Psych:   Denies depression, insomnia or hallucinations   Other:  All other systems negative   VS:  BP (!) 151/91 (BP Location: Right Arm)    Pulse 60    Temp 98 F (36.7 C)    Resp 18    Ht 5\' 11"  (1.803 m)    Wt 99.8 kg    SpO2 98%    BMI 30.68 kg/m      PHYSICAL EXAM    GENERAL:NAD, no fevers, chills, no weakness no fatigue HEAD: Normocephalic, atraumatic.  EYES: Pupils equal, round, reactive to light. Extraocular muscles intact. No scleral icterus.  MOUTH: Moist mucosal membrane. Dentition intact. No abscess noted.  EAR, NOSE, THROAT: Clear without exudates. No external lesions.  NECK: Supple. No thyromegaly. No nodules. No JVD.  PULMONARY: Decreased air entry worse on left  CARDIOVASCULAR: S1 and S2. Regular rate and rhythm. No murmurs, rubs, or gallops. No edema. Pedal pulses 2+ bilaterally.  GASTROINTESTINAL: No masses. Positive bowel sounds. No hepatosplenomegaly.  +surgical wound with dressing tender to touch MUSCULOSKELETAL: No swelling, clubbing, or edema. Range of motion full in all extremities.   NEUROLOGIC: Cranial nerves II through XII are intact. No gross focal neurological deficits. Sensation intact. Reflexes intact.  SKIN: No ulceration, lesions, rashes, or cyanosis. Skin warm and dry. Turgor intact.  PSYCHIATRIC: Mood, affect within normal limits. The patient is awake, alert and oriented x 3. Insight, judgment intact.       IMAGING    CT CHEST WO CONTRAST  Result Date: 04/27/2021 CLINICAL DATA:  Difficulty breathing, infiltrate left lung EXAM: CT CHEST WITHOUT CONTRAST TECHNIQUE: Multidetector CT imaging of the chest was performed following the standard protocol without IV contrast. COMPARISON:  Chest radiograph done earlier today FINDINGS: Cardiovascular: There are few tiny coronary artery calcifications. Mediastinum/Nodes: No significant lymphadenopathy seen. Lungs/Pleura: There is 3.6 cm focal ground-glass density in the anterolateral aspect of right upper lobe. There are other smaller foci of ground-glass densities in the medial right mid lung fields and posterior segment of right upper lobe. There are small foci of ground-glass infiltrates in the right middle lobe and right lower lobe. There are linear densities in the posterior right lower lung fields. There is complete atelectasis of left lower lobe. There are filling defects in the left main bronchus. No significant focal abnormality is seen in the left upper lobe. There is minimal left pleural effusion. There is no pneumothorax. Upper Abdomen: Tip of enteric tube is seen in the stomach. There is small amount of pneumoperitoneum adjacent to the liver. Pneumoperitoneum was also seen in the previous CT abdomen done on 04/25/2021. Increased density in the lumen of gallbladder may be related to vicarious contrast excretion into the bile or suggest presence of sludge or tiny stones. There is no wall thickening in gallbladder. Musculoskeletal: Unremarkable. IMPRESSION: There is complete atelectasis in the left lower lobe, possibly due to  mucous plugging in the central bronchi. Small left pleural effusion. There are patchy ground-glass infiltrates in the right lung suggesting possible multifocal pneumonia. Small amount of pneumoperitoneum is noted adjacent to the liver which may suggest recent abdominal surgery or suggest bowel perforation. Other findings as described in the body of the report. Electronically Signed   By: Elmer Picker M.D.   On: 04/27/2021 11:01   CT ABDOMEN PELVIS W CONTRAST  Result Date: 04/26/2021 CLINICAL DATA:  RLQ abdominal pain (Age >= 14y) EXAM: CT ABDOMEN AND PELVIS WITH CONTRAST TECHNIQUE: Multidetector CT imaging of the abdomen and pelvis was performed using the standard protocol following bolus administration of intravenous contrast. CONTRAST:  14mL OMNIPAQUE IOHEXOL 300 MG/ML  SOLN  COMPARISON:  None. FINDINGS: Lower chest: No acute abnormality. Hepatobiliary: No focal liver abnormality. No gallstones, gallbladder wall thickening, or pericholecystic fluid. No biliary dilatation. Pancreas: No focal lesion. Normal pancreatic contour. No surrounding inflammatory changes. No main pancreatic ductal dilatation. Spleen: Normal in size without focal abnormality. Adrenals/Urinary Tract: No adrenal nodule bilaterally. Bilateral kidneys enhance symmetrically. No hydronephrosis. No hydroureter. The urinary bladder is unremarkable. Stomach/Bowel: Stomach is within normal limits. No evidence of bowel wall thickening or dilatation. No pneumatosis. Scattered colonic diverticulosis. Suggestion of appendectomy. Vascular/Lymphatic: No definite portal venous or mesenteric venous gas. The main portal, splenic, superior mesenteric veins are patent. No abdominal aorta or iliac aneurysm. Mild atherosclerotic plaque of the aorta and its branches. No abdominal, pelvic, or inguinal lymphadenopathy. Reproductive: Prostate is unremarkable. Other: Small volume free fluid and small volume pneumoperitoneum. Query origin of pneumoperitoneum  and free fluid from a perforation involving the small bowel or rectum (5:43). No organized fluid collection. Musculoskeletal: Small fat containing umbilical hernia with an abdominal defect of 2.1 cm. Small fat containing left spigelian hernia just inferior to the level of the umbilicus with an abdominal defect of 0.9 cm. Possible tiny fat containing left inguinal hernia. No suspicious lytic or blastic osseous lesions. No acute displaced fracture. Multilevel degenerative changes of the spine. IMPRESSION: 1. Small volume pneumoperitoneum and small volume ascites likely originating from a bowel perforation. Likely origin of perforation includes the small bowel or rectum in the anterior lower abdomen/pelvis. 2. Scattered colonic diverticulosis with no acute diverticulitis. 3. Small fat containing umbilical hernia with an abdominal defect of 2.1 cm. Small fat containing left spigelian hernia just inferior to the level of the umbilicus with an abdominal defect of 0.9 cm. Possible tiny fat containing left inguinal hernia. These results were called by telephone at the time of interpretation on 04/25/2021 at 11:57 pm to provider Dr. Leonides Schanz, who verbally acknowledged these results. Electronically Signed   By: Iven Finn M.D.   On: 04/26/2021 00:03   DG Chest Port 1 View  Result Date: 04/28/2021 CLINICAL DATA:  COVID positive patient. Follow-up left atelectasis. Clinical symptoms improving. EXAM: PORTABLE CHEST 1 VIEW COMPARISON:  04/27/2021 and older exams. FINDINGS: There has been a significant interval improvement in lung aeration, with most of the previously seen left mid to lower lung opacity resolving. There are subtle residual areas of opacity bilaterally. No new lung abnormalities. No convincing pleural effusion.  No pneumothorax. Cardiac silhouette is normal in size. Normal mediastinal and hilar contours. Nasal/orogastric tube is stable, tip in the mid stomach. IMPRESSION: 1. Significant interval improvement with  near complete resolution of the left mid to lower lung opacities. Electronically Signed   By: Lajean Manes M.D.   On: 04/28/2021 10:12   DG Chest Port 1 View  Result Date: 04/27/2021 CLINICAL DATA:  56 year old male positive COVID-19. Shortness of breath. EXAM: PORTABLE CHEST 1 VIEW COMPARISON:  04/26/2021 portable chest and earlier. FINDINGS: Portable AP semi upright view at 0543 hours. Stable enteric tube. Mildly lower lung volumes and interval substantial opacification of the left medial and lower lung. Some left hilar air bronchograms. Some leftward shift of the mediastinum. Visible mediastinal contours remain normal. Obscured left hemidiaphragm. No pneumothorax. Right lung appears negative. No pleural effusion is evident. No acute osseous abnormality identified. IMPRESSION: 1. Moderate collapse and/or consolidation of the left lung since 04/26/2021. 2. Negative right lung.  Stable enteric tube. Electronically Signed   By: Genevie Ann M.D.   On: 04/27/2021 06:43   DG  Chest Port 1 View  Result Date: 04/26/2021 CLINICAL DATA:  Short of breath, COVID-19 positive EXAM: PORTABLE CHEST 1 VIEW COMPARISON:  11/20/2018, 04/25/2021 FINDINGS: Single frontal view of the chest demonstrates enteric catheter passing below diaphragm tip excluded by collimation. Cardiac silhouette is stable. Increased consolidation at the left lung base with associated volume loss consistent with atelectasis. Small left pleural effusion is not excluded. No pneumothorax. No acute bony abnormalities. IMPRESSION: 1. Developing left basilar consolidation and associated volume loss, consistent with atelectasis. 2. Enteric catheter, tip excluded by collimation. Electronically Signed   By: Randa Ngo M.D.   On: 04/26/2021 15:18      ASSESSMENT/PLAN     Acute hypoxemic respiratory failure Acute COVID19 infection -Remdesevir antiviral - pharmacy protocol 5 d -vitamin C -zinc -decadron 6mg  IV daily  -encourage to use IS and Acapella  device for bronchopulmonary hygiene when able monitoring -PT/OT when possible -procalcitonin, CRP and ferritin trending   Complete left atelectasis    Hypertonic saline nebulized 3% 38ml once daily   -incentive spirometry  -improved substantially  -repeat CXR    Thank you for allowing me to participate in the care of this patient.      Patient/Family are satisfied with care plan and all questions have been answered.   This document was prepared using Dragon voice recognition software and may include unintentional dictation errors.     Ottie Glazier, M.D.  Division of [facility] [place]

## 2021-04-30 NOTE — Progress Notes (Signed)
[facility] Progress Note  Patient: Timothy Cameron    FXT:[id number]  DOA: 04/26/2021     Date of Service: the patient was seen and examined on 04/30/2021  Chief Complaint  Patient presents with   Abdominal Pain   Brief hospital course: Timothy Cameron is a 56 y.o. 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  Patient is being taken to the OR at the time of this consultation and denies complaints outside of presenting symptoms of right-sided lower quadrant pain and nausea.  He denies a history of CAD, CHF, A. fib, syncope, seizures, stroke, COPD or OSA.  He denies cough or shortness of breath and at baseline is good exercise tolerance  Assessment and Plan:  Bowel perforation (HCC) S/p ex lap, JP drain intact Continue Zosyn for peritonitis Continue IV fluid for hydration -Management per surgery 1/9 clear liquid diet started by general surgery, bowel functions returned, colostomy working well.   COVID-19 viral infection positive, incidental finding CXR shows possible left lung collapse CT chest: complete atelectasis in the left lower lobe, possibly due to mucous plugging in the central bronchi. Small left pleuraleffusion. There are patchy ground-glass infiltrates in the right lung suggesting possible multifocal pneumonia Started remdesivir for 5 days and Decadron IV for 10 days Started Mucinex 600 p.o. twice daily Continue incentive spirometry and flutter valve Continue chest PT Pulmonary critical care consulted for possible bronchoscopy Completed left atelectasis, hypertonic saline nebulized and instead respiratory ordered by [facility] 1/7 CXR Significant interval improvement with near complete resolution of the left mid to lower lung opacities   Diabetes (HCC) - Sliding scale insulin coverage   HTN (hypertension) - Hydralazine IV as needed  while NPO   HLD (hyperlipidemia) - Hold meds until oral route established  Body mass index is 30.68 kg/m.  Interventions:     Diet: Clear liquid diet , advance per primary team General surgery DVT Prophylaxis: Subcutaneous Lovenox   Advance goals of care discussion: Full code  Family Communication: family was NOT present at bedside, at the time of interview.  The pt provided permission to discuss medical plan with the family. Opportunity was given to ask question and all questions were answered satisfactorily.   Disposition:  Pt is from Home, admitted with bowel perforation for ex lap under general surgery, NG tube clamped and clear liquid diet started, advance as per primary team General surgery, which precludes a safe discharge. Discharge to home, when clinically stable.  Subjective: No significant overnight events, patient breathing has been improved, currently saturating well on room air.  Still has some abdominal soreness after surgery, stated that his colostomy is working well and he is hungry, would like to eat something.   Physical Exam: General:  alert oriented to time, place, and person.  Appear in moderate distress, affect appropriate Eyes: PERRLA ENT: Oral Mucosa Clear, moist  Neck: no JVD,  Cardiovascular: S1 and S2 Present, no Murmur,  Respiratory: normal respiratory effort, mild crackles bibasilar area, no significant wheezing appreciated.   Abdomen: Bowel sounds sluggish, soft and generalized tenderness, colostomy intact, JP drain intact, staples intact  Skin: No rashes Extremities: No pedal edema, no calf tenderness Neurologic: without any new focal findings Gait not checked due to patient safety concerns  Vitals:   04/29/21 0859 04/29/21 1802 04/29/21 1956 04/30/21 0257  BP: (!) 150/96 (!) 145/95 (!) 150/92 (!) 151/91  Pulse: Marland Kitchen)  59 61 62 60  Resp: 16 18 20 18   Temp: 97.8 F (36.6 C) 98.3 F (36.8 C) 98.4 F (36.9 C) 98 F (36.7 C)  TempSrc:       SpO2: 98% 98% 98% 98%  Weight:      Height:        Intake/Output Summary (Last 24 hours) at 04/30/2021 1628 Last data filed at 04/30/2021 0258 Gross per 24 hour  Intake 667.92 ml  Output 1465 ml  Net -797.08 ml   Filed Weights   04/25/21 2222  Weight: 99.8 kg    Data Reviewed: I have personally reviewed and interpreted daily labs, tele strips, imagings as discussed above. I reviewed all nursing notes, pharmacy notes, vitals, pertinent old records I have discussed plan of care as described above with RN and patient/family.  CBC: Recent Labs  Lab 04/25/21 2232 04/27/21 0500 04/28/21 0604 04/29/21 0504 04/30/21 0515  WBC 18.5* 14.5* 13.5* 10.7* 6.9  NEUTROABS 15.8*  --   --   --   --   HGB 18.6* 16.1 14.7 13.9 14.0  HCT 52.2* 47.1 41.8 40.1 40.4  MCV 91.3 92.5 93.5 92.2 91.6  PLT 216 139* 142* 171 186   Basic Metabolic Panel: Recent Labs  Lab 04/25/21 2232 04/27/21 0500 04/28/21 0604 04/29/21 0504 04/30/21 0515  NA 133* 136 134* 136 138  K 3.8 3.7 4.1 4.1 4.0  CL 100 106 104 106 106  CO2 21* 24 21* 24 26  GLUCOSE 280* 187* 206* 248* 207*  BUN 16 18 27* 27* 28*  CREATININE 1.28* 0.80 0.75 0.81 0.84  CALCIUM 9.0 8.0* 8.2* 8.4* 8.2*  MG  --  2.0 2.1 2.1 1.9  PHOS  --  2.5 3.4 2.7 4.3    Studies: DG Chest Port 1 View  Result Date: 04/30/2021 CLINICAL DATA:  Left lower lobe infiltrate EXAM: PORTABLE CHEST 1 VIEW COMPARISON:  Radiograph 04/28/2021, chest CT 04/27/2021 FINDINGS: There is a nasogastric tube which passes below the diaphragm, tip excluded by collimation. Unchanged cardiomediastinal silhouette. There is continued improvement in left lower lung aeration. No new airspace disease. No pleural effusion. No pneumothorax. No acute osseous abnormality. IMPRESSION: Continued improvement in left lower lung aeration. No new airspace disease. Electronically Signed   By: 06/25/2021 M.D.   On: 04/30/2021 09:21    Scheduled Meds:  albuterol  2 puff Inhalation Q6H    atorvastatin  20 mg Oral QHS   azithromycin  500 mg Oral Daily   dexamethasone (DECADRON) injection  6 mg Intravenous Q24H   enoxaparin (LOVENOX) injection  0.5 mg/kg Subcutaneous Q24H   gabapentin  300 mg Oral BID   guaiFENesin  600 mg Oral BID   insulin aspart  0-15 Units Subcutaneous TID WC   ipratropium  2 puff Inhalation Q6H   ketorolac  30 mg Intravenous Q6H   pantoprazole  40 mg Oral QHS   Continuous Infusions:  sodium chloride 50 mL/hr at 04/30/21 0258   piperacillin-tazobactam (ZOSYN)  IV 3.375 g (04/30/21 1421)   remdesivir 100 mg in NS 100 mL 100 mg (04/30/21 0913)   PRN Meds: hydrALAZINE, HYDROcodone-acetaminophen, HYDROmorphone (DILAUDID) injection, ondansetron **OR** ondansetron (ZOFRAN) IV  Time spent: 35 minutes  Author: 06/28/21. MD [facility] 04/30/2021 4:28 PM  To reach On-call, see care teams to locate the attending and reach out to them via [email or web address]. If 7PM-7AM, please contact night-coverage If you still have difficulty reaching the attending provider, please page the [facility] (Director on Call)  for [facility] on amion for assistance.

## 2021-04-30 NOTE — Consult Note (Signed)
WOC Nurse ostomy follow up Stoma type/location: RLQ, loop ileostomy  Stomal assessment/size: 1 1/2" pink, budded, support rod in place Peristomal assessment: NA; pouch intact and changed per surgeon over the weekend for assessment  Treatment options for stomal/peristomal skin: 2" skin barrier ring Output liquid green Ostomy pouching: 2pc. 2 1/4" with 2" skin barrier ring Education provided:  Practiced emptying using lock and roll; patient did need some reinforcement today  Demonstrated use of wick to clean spout  Discussed bathing, diet, gas, medication use, risk of blockage and dehydration   Enrolled patient in [employer] Discharge program: Yes Patient still with NG tube but engaged to learn.  Will allow patient to change pouch tomorrow   WOC Nurse will follow along with you for continued support with ostomy teaching and care Jenner Rosier [facility] MSN, RN, [place], CNS, [place] [phone]

## 2021-04-30 NOTE — Anesthesia Postprocedure Evaluation (Signed)
Anesthesia Post Note  Patient: Timothy Cameron  Procedure(s) Performed: APPENDECTOMY (Abdomen) partial colectomy with anastomosis (Abdomen) ILEOSTOMY Creation (Abdomen) HERNIA REPAIR INCISIONAL (Abdomen)  Patient location during evaluation: PACU Anesthesia Type: General Level of consciousness: awake and alert Pain management: pain level controlled Vital Signs Assessment: post-procedure vital signs reviewed and stable Respiratory status: spontaneous breathing, nonlabored ventilation, respiratory function stable and patient connected to nasal cannula oxygen Cardiovascular status: blood pressure returned to baseline and stable Postop Assessment: no apparent nausea or vomiting Anesthetic complications: no   No notable events documented.   Last Vitals:  Vitals:   04/29/21 1956 04/30/21 0257  BP: (!) 150/92 (!) 151/91  Pulse: 62 60  Resp: 20 18  Temp: 36.9 C 36.7 C  SpO2: 98% 98%    Last Pain:  Vitals:   04/30/21 0920  TempSrc:   PainSc: 8                  Lenard Simmer

## 2021-04-30 NOTE — Progress Notes (Signed)
Inpatient Diabetes Program Recommendations  AACE/ADA: New Consensus Statement on Inpatient Glycemic Control  Target Ranges:  Prepandial:   less than 140 mg/dL      Peak postprandial:   less than 180 mg/dL (1-2 hours)      Critically ill patients:  140 - 180 mg/dL    Latest Reference Range & Units 04/29/21 08:56 04/29/21 12:03 04/29/21 17:54 04/29/21 20:23 04/30/21 08:27 04/30/21 11:26  Glucose-Capillary 70 - 99 mg/dL 400 (H) 867 (H) 619 (H) 261 (H) 164 (H) 139 (H)   Review of Glycemic Control  Diabetes history: DM2 Outpatient Diabetes medications: Glipizide 10 mg daily Current orders for Inpatient glycemic control: Novolog 0-15 units TID with meals; Decadron 6 mg Q24H  Inpatient Diabetes Program Recommendations:    Insulin: If steroids are continued, please consider ordering Novolog 2 units TID with meals for meal coverage if patient eats at least 50% of meals.  Thanks, Orlando Penner, RN, MSN, CDE Diabetes Coordinator Inpatient Diabetes Program [phone] (Team Pager from 8am to 5pm)

## 2021-05-01 DIAGNOSIS — K631 Perforation of intestine (nontraumatic): Secondary | ICD-10-CM | POA: Diagnosis not present

## 2021-05-01 DIAGNOSIS — R7881 Bacteremia: Secondary | ICD-10-CM | POA: Diagnosis not present

## 2021-05-01 DIAGNOSIS — E785 Hyperlipidemia, unspecified: Secondary | ICD-10-CM

## 2021-05-01 DIAGNOSIS — J1282 Pneumonia due to coronavirus disease 2019: Secondary | ICD-10-CM

## 2021-05-01 DIAGNOSIS — U071 COVID-19: Secondary | ICD-10-CM | POA: Diagnosis not present

## 2021-05-01 DIAGNOSIS — E1169 Type 2 diabetes mellitus with other specified complication: Secondary | ICD-10-CM | POA: Diagnosis not present

## 2021-05-01 LAB — GLUCOSE, CAPILLARY
Glucose-Capillary: 160 mg/dL — ABNORMAL HIGH (ref 70–99)
Glucose-Capillary: 169 mg/dL — ABNORMAL HIGH (ref 70–99)
Glucose-Capillary: 287 mg/dL — ABNORMAL HIGH (ref 70–99)
Glucose-Capillary: 270 mg/dL — ABNORMAL HIGH (ref 70–99)

## 2021-05-01 LAB — CULTURE, BLOOD (ROUTINE X 2): Culture: NO GROWTH

## 2021-05-01 MED ORDER — METHYLPREDNISOLONE 4 MG PO TBPK: 4.0000 mg | ORAL_TABLET | ORAL | Status: DC

## 2021-05-01 MED ORDER — METHYLPREDNISOLONE 4 MG PO TBPK: 8.0000 mg | ORAL_TABLET | Freq: Every evening | ORAL | Status: DC

## 2021-05-01 MED ORDER — METHYLPREDNISOLONE 4 MG PO TBPK
4.0000 mg | ORAL_TABLET | Freq: Three times a day (TID) | ORAL | Status: DC
  Administered 2021-05-02 (×2): 4 mg via ORAL

## 2021-05-01 MED ORDER — METHYLPREDNISOLONE 4 MG PO TBPK
8.0000 mg | ORAL_TABLET | Freq: Every morning | ORAL | Status: AC
  Administered 2021-05-02: 8 mg via ORAL
  Filled 2021-05-01: qty 21

## 2021-05-01 MED ORDER — LOSARTAN POTASSIUM 25 MG PO TABS
25.0000 mg | ORAL_TABLET | Freq: Every day | ORAL | Status: DC
  Administered 2021-05-01 – 2021-05-03 (×3): 25 mg via ORAL
  Filled 2021-05-01 (×3): qty 1

## 2021-05-01 MED ORDER — METHYLPREDNISOLONE 4 MG PO TBPK
8.0000 mg | ORAL_TABLET | Freq: Every evening | ORAL | Status: AC
  Administered 2021-05-01: 8 mg via ORAL
  Filled 2021-05-01: qty 21

## 2021-05-01 MED ORDER — PIPERACILLIN-TAZOBACTAM 3.375 G IVPB
3.3750 g | Freq: Three times a day (TID) | INTRAVENOUS | Status: DC
  Administered 2021-05-01 – 2021-05-02 (×3): 3.375 g via INTRAVENOUS
  Filled 2021-05-01 (×3): qty 50

## 2021-05-01 MED ORDER — METHYLPREDNISOLONE 4 MG PO TBPK
4.0000 mg | ORAL_TABLET | ORAL | Status: AC
  Administered 2021-05-01: 4 mg via ORAL
  Filled 2021-05-01: qty 21

## 2021-05-01 MED ORDER — METHYLPREDNISOLONE 4 MG PO TBPK: 4.0000 mg | ORAL_TABLET | Freq: Four times a day (QID) | ORAL | Status: DC

## 2021-05-01 MED ORDER — INSULIN ASPART 100 UNIT/ML IJ SOLN
2.0000 [IU] | Freq: Three times a day (TID) | INTRAMUSCULAR | Status: DC
  Administered 2021-05-01 – 2021-05-03 (×8): 2 [IU] via SUBCUTANEOUS
  Filled 2021-05-01 (×5): qty 1

## 2021-05-01 MED ORDER — PIPERACILLIN-TAZOBACTAM 3.375 G IVPB: 3.3750 g | Freq: Three times a day (TID) | INTRAVENOUS | Status: DC

## 2021-05-01 NOTE — Progress Notes (Signed)
Patient ID: Timothy Cameron, male   DOB: 1966/03/15, 56 y.o.   MRN: [id number]     [facility] Day(s): 5.   Interval History: Patient seen and examined, no acute events or new complaints overnight. Patient reports continued having intermittent discomfort in the abdomen.  This is described as intermittent bloating.  Vital signs in last 24 hours: [min-max] current  Temp:  [97.9 F (36.6 C)-98.3 F (36.8 C)] 98.3 F (36.8 C) (01/10 1708) Pulse Rate:  [50-60] 59 (01/10 1708) Resp:  [16-18] 18 (01/10 1708) BP: (136-155)/(90-104) 155/90 (01/10 1708) SpO2:  [98 %-100 %] 98 % (01/10 1708)     Height: 5\' 11"  (180.3 cm) Weight: 99.8 kg BMI (Calculated): 30.7   Physical Exam:  Constitutional: alert, cooperative and no distress  Respiratory: breathing non-labored at rest  Cardiovascular: regular rate and sinus rhythm  Gastrointestinal: soft, non-tender, and mild-distended  Labs:  CBC Latest Ref Rng & Units 04/30/2021 04/29/2021 04/28/2021  WBC 4.0 - 10.5 K/uL 6.9 10.7(H) 13.5(H)  Hemoglobin 13.0 - 17.0 g/dL 14.0 13.9 14.7  Hematocrit 39.0 - 52.0 % 40.4 40.1 41.8  Platelets 150 - 400 K/uL 186 171 142(L)   CMP Latest Ref Rng & Units 04/30/2021 04/29/2021 04/28/2021  Glucose 70 - 99 mg/dL 207(H) 248(H) 206(H)  BUN 6 - 20 mg/dL 28(H) 27(H) 27(H)  Creatinine 0.61 - 1.24 mg/dL 0.84 0.81 0.75  Sodium 135 - 145 mmol/L 138 136 134(L)  Potassium 3.5 - 5.1 mmol/L 4.0 4.1 4.1  Chloride 98 - 111 mmol/L 106 106 104  CO2 22 - 32 mmol/L 26 24 21(L)  Calcium 8.9 - 10.3 mg/dL 8.2(L) 8.4(L) 8.2(L)  Total Protein 6.5 - 8.1 g/dL 5.5(L) 5.6(L) 5.9(L)  Total Bilirubin 0.3 - 1.2 mg/dL 0.8 0.8 1.2  Alkaline Phos 38 - 126 U/L 38 40 40  AST 15 - 41 U/L 12(L) 11(L) 15  ALT 0 - 44 U/L 15 15 16     Imaging studies: No new pertinent imaging studies   Assessment/Plan:  56 y.o. male with perforated diverticulitis 5 Day Post-Op s/p partial colectomy, appendectomy, laparotomy, complicated by pertinent  comorbidities including uncontrolled diabetes, hypertension.  Sigmoid perforation s/p sigmoid resection -Pathology showed ischemic colitis with perforation -White blood cell within normal limit.  No fever -Still with intermittent ileus.  Patient having ileostomy output but still distended and nauseated intermittently. -We will continue with trial of liquids until patient does not feel nauseated and has improved GI function.  Uncontrolled diabetes -Appreciate hospitalist management and recommendations  Positive COVID-19 -Patient with improved symptoms -Appreciate pulmonology management recommendations  Arnold Long, MD

## 2021-05-01 NOTE — Progress Notes (Addendum)
Patient ID: Timothy Cameron, male   DOB: March 26, 1966, 56 y.o.   MRN: [id number] [facility] PROGRESS NOTE  Timothy Cameron:[id number] DOB: 19-Sep-1965 DOA: 04/26/2021 PCP: Preston Fleeting, MD  HPI/Subjective: Patient feeling better.  Still having some abdominal soreness.  Was upset about not getting any breakfast this morning.  He states he is feeling better.  No nausea or vomiting.  Having output through the ostomy.  He admitted with bowel perforation and also found to have COVID-19 infection.  Objective: Vitals:   05/01/21 0306 05/01/21 0854  BP: 136/90 (!) 144/91  Pulse: (!) 51 (!) 50  Resp: 16 18  Temp: 97.9 F (36.6 C) 97.9 F (36.6 C)  SpO2: 100% 98%    Intake/Output Summary (Last 24 hours) at 05/01/2021 1418 Last data filed at 05/01/2021 0606 Gross per 24 hour  Intake --  Output 1621 ml  Net -1621 ml   Filed Weights   04/25/21 2222  Weight: 99.8 kg    ROS: Review of Systems  Respiratory:  Negative for shortness of breath.   Cardiovascular:  Negative for chest pain.  Gastrointestinal:  Positive for abdominal pain. Negative for nausea and vomiting.  Exam: Physical Exam HENT:     Head: Normocephalic.     Mouth/Throat:     Pharynx: No oropharyngeal exudate.  Eyes:     General: Lids are normal.     Conjunctiva/sclera: Conjunctivae normal.  Cardiovascular:     Rate and Rhythm: Normal rate and regular rhythm.     Heart sounds: Normal heart sounds, S1 normal and S2 normal.  Pulmonary:     Breath sounds: No decreased breath sounds, wheezing, rhonchi or rales.  Abdominal:     Palpations: Abdomen is soft.     Tenderness: There is generalized abdominal tenderness.  Musculoskeletal:     Right lower leg: No swelling.     Left lower leg: No swelling.  Skin:    General: Skin is warm.     Findings: No rash.  Neurological:     Mental Status: He is alert and oriented to person, place, and time.      Scheduled Meds:  albuterol  2 puff Inhalation Q6H    atorvastatin  20 mg Oral QHS   dexamethasone (DECADRON) injection  6 mg Intravenous Q24H   enoxaparin (LOVENOX) injection  0.5 mg/kg Subcutaneous Q24H   gabapentin  300 mg Oral BID   guaiFENesin  600 mg Oral BID   insulin aspart  0-15 Units Subcutaneous TID WC   insulin aspart  2 Units Subcutaneous TID WC   ipratropium  2 puff Inhalation Q6H   ketorolac  30 mg Intravenous Q6H   pantoprazole  40 mg Oral QHS   Continuous Infusions:  sodium chloride 50 mL/hr at 04/30/21 1809   piperacillin-tazobactam (ZOSYN)  IV     Brief history.  56 year old man with type 2 diabetes hypertension hyperlipidemia presented to the hospital with abdominal pain and found to have a perforated bowel.  Patient was brought to the operating room by Dr. Hazle Quant on 04/26/2020.  Patient was also found to be COVID-positive.  1 blood culture bottle growing a gram-negative rod.  Assessment/Plan:  COVID-19 penumonia/infection with atelectasis and mucous plugging in the left lung and groundglass opacities/pneumonia in the right lung as per CT scan.  Patient completed remdesivir today on Decadron.  Patient not requiring oxygen. Bacteremia with gram-negative rod in 1 blood culture bottle.  Continue Zosyn.  There is likely an anaerobe from bowel perforation.  Bowel perforation status post exploratory laparotomy, sigmoid colectomy with anastomosis, appendectomy and incisional hernia repair by Dr. Hazle Quant on 04/26/2020.  Diet and activity as per Dr. Trisha Mangle.  Empirically on Zosyn.  This morning NG tube was clamped. Type 2 diabetes mellitus with hyperlipidemia.  On sliding scale insulin.  Last hemoglobin A1c 7.1.  Added short acting insulin prior to meals.  Sugars will be a little higher with being on steroids.  Continue atorvastatin. Essential hypertension.  Restart losartan since blood pressure trending higher.        Code Status:     Code Status Orders  (From admission, onward)           Start     Ordered    04/26/21 0811  Full code  Continuous        04/26/21 0810           Code Status History     Date Active Date Inactive Code Status Order ID Comments User Context   04/26/2021 0112 04/26/2021 0756 Full Code [id number]  Carolan Shiver, MD ED   03/08/2016 1352 03/10/2016 2045 Full Code [id number]  Adrian Saran, MD Inpatient       Disposition Plan: Status is: Inpatient  Case discussed with pharmacist and general surgery  Antibiotics: Zosyn  Vernell Back [facility]  [facility]

## 2021-05-01 NOTE — Evaluation (Signed)
Occupational Therapy Evaluation Patient Details Name: Timothy Cameron MRN: [id number] DOB: 04-08-1966 Today's Date: 05/01/2021   History of Present Illness Patient is a 56 year old male who presented with abdominal pain. Found to have acute perforation of the intestine with emergent exploratory laparotomy performed with sigmoid colectomy and ileostomy performed. COVID positive as incidental finding.   Clinical Impression   Timothy Cameron presents today with reduced endurance and mild/mod abdominal pain. PTA, he lives in a single-story home with a roommate, is IND in ADL and IADL, works full time as a Curator. During today's evaluation, he is able to perform bed mobility, ambulation w/o AD, toileting, grooming, with Mod I (increased time/effort), good balance, steady reaching outside of BOS in sitting and standing. He reports that he feels somewhat weak and has abdominal pain with movement, but, other than that, is back to his baseline level of fxl mobility. He has a friend who will stay with him post DC in case he needs any assistance. He states that he would like to quit smoking and to reduce his alcohol consumption. Provided educ re: strategies and supports for making these changes. Pt and therapist in agreement that no further OT services are indicated at this time. Pt may benefit from referral to smoking cessation program or Rx for nicotine replacement.    Recommendations for follow up therapy are one component of a multi-disciplinary discharge planning process, led by the attending physician.  Recommendations may be updated based on patient status, additional functional criteria and insurance authorization.   Follow Up Recommendations  No OT follow up    Assistance Recommended at Discharge PRN  Patient can return home with the following      Functional Status Assessment  Patient has had a recent decline in their functional status and demonstrates the ability to make significant improvements  in function in a reasonable and predictable amount of time.  Equipment Recommendations  None recommended by OT    Recommendations for Other Services Other (comment) (support for smoking cessation, as needed and per pt's interest)     Precautions / Restrictions Precautions Precautions: None Restrictions Weight Bearing Restrictions: No      Mobility Bed Mobility Overal bed mobility: Modified Independent Bed Mobility: Supine to Sit;Sit to Supine     Supine to sit: Modified independent (Device/Increase time) Sit to supine: Modified independent (Device/Increase time)   General bed mobility comments: increased time and effort with bed mobility, with no physical assistance.    Transfers Overall transfer level: Modified independent Equipment used: None Transfers: Sit to/from Stand Sit to Stand: Modified independent (Device/Increase time)           General transfer comment: increased time, with fluid, safe movements      Balance Overall balance assessment: Needs assistance Sitting-balance support: Feet supported Sitting balance-Timothy Cameron: Good     Standing balance support: No upper extremity supported Standing balance-Timothy Cameron: Good                             ADL either performed or assessed with clinical judgement   ADL Overall ADL's : Modified independent                                             Vision         Perception  Praxis      Pertinent Vitals/Pain Pain Assessment: Faces Pain Score:  (5/10 at rest and 8/10 with mobility) Faces Pain Cameron: Hurts a little bit Pain Location: abdomen Pain Descriptors / Indicators: Aching Pain Intervention(s): Repositioned;Limited activity within patient's tolerance     Hand Dominance     Extremity/Trunk Assessment Upper Extremity Assessment Upper Extremity Assessment: Overall WFL for tasks assessed   Lower Extremity Assessment Lower Extremity Assessment: Overall WFL  for tasks assessed   Cervical / Trunk Assessment Cervical / Trunk Assessment: Normal   Communication Communication Communication: No difficulties   Cognition Arousal/Alertness: Awake/alert Behavior During Therapy: WFL for tasks assessed/performed Overall Cognitive Status: Within Functional Limits for tasks assessed                                 General Comments: patient is alert and oriented, able to follow all directions without difficulty     General Comments  Pt reports "only difficulty" he is having at present is with ostomy bag mgmt. Nurse notified.    Exercises Other Exercises Other Exercises: Bed mobility, transfers, toileting, grooming. Educ re: smoking cessation, harm reduction re: alcohol use.   Shoulder Instructions      Home Living Family/patient expects to be discharged to:: Private residence Living Arrangements: Non-relatives/Friends Available Help at Discharge: Friend(s) Type of Home: Mobile home Home Access: Stairs to enter [employer] of Steps: 4         Bathroom Shower/Tub: Chief Strategy Officer: Standard     Home Equipment: Shower seat          Prior Functioning/Environment Prior Level of Function : Independent/Modified Independent;Working/employed             Mobility Comments: independent and active prior to admission. he is a Curator ADLs Comments: independent        OT Problem List: Decreased strength;Decreased range of motion;Decreased activity tolerance      OT Treatment/Interventions:      OT Goals(Current goals can be found in the care plan section) Acute Rehab OT Goals Patient Stated Goal: to quit smoking OT Goal Formulation: With patient Time For Goal Achievement: 05/15/21 Potential to Achieve Goals: Good  OT Frequency:      Co-evaluation              AM-PAC OT "6 Clicks" Daily Activity     Outcome Measure Help from another person eating meals?: None Help from another  person taking care of personal grooming?: None Help from another person toileting, which includes using toliet, bedpan, or urinal?: None Help from another person bathing (including washing, rinsing, drying)?: None Help from another person to put on and taking off regular upper body clothing?: None Help from another person to put on and taking off regular lower body clothing?: A Little 6 Click Score: 23   End of Session Nurse Communication: Other (comment) (pt struggling with ostomy care)  Activity Tolerance: Patient tolerated treatment well Patient left: in bed;with call bell/phone within reach;with family/visitor present  OT Visit Diagnosis: Muscle weakness (generalized) (M62.81);Pain                Time: 0017-4944 OT Time Calculation (min): 12 min Charges:  OT General Charges $OT Visit: 1 Visit OT Evaluation $OT Eval Low Complexity: 1 Low OT Treatments $Self Care/Home Management : 8-22 mins Latina Craver, PhD, MS, OTR/L 05/01/21, 4:17 PM

## 2021-05-01 NOTE — Consult Note (Signed)
WOC Nurse ostomy follow up Stoma type/location: RLQ, end ileostomy  Stomal assessment/size: 1 1/4" x 1 1/2" oval shaped, with support rod in place sutured around stoma  Peristomal assessment: intact  Treatment options for stomal/peristomal skin: 2" skin barrier ring Output liquid green; patient had emptied just prior to my arrival to the room  Ostomy pouching: 2pc. 2 1/4" with 2" skin barrier ring; off centered to accommodate midline incision with staples Education provided:  Allowed patient to change ostomy pouch today. Patient cut new skin barrier, cleaned skin with assistance for areas he could not see, applied barrier ring with guidance due to site (lying in bed), placed new skin barrier with assist and attached pouch to the skin barrier independently.  He closed lock and roll inially and did not get it closed, we reviewed the importance of the "feeling of the Velcro" lock and roll closure Enrolled patient in [place] [employer] Discharge program: Yes  Today patient reports a friend "[name]" that is going to stay with him for a few days after he goes home.  He reports it would be nice to have her have some teaching just in case he needs a second set of hands at which time I agreed.  He will ask her to be present Thursday and I will notified my teammates of this.  I will not be working Thursday or Friday.  Discussed with CM as well for DC planning needs WOC Nurse will follow along with you for continued support with ostomy teaching and care Timothy Cameron [facility] MSN, RN, [place], CNS, [place] [phone]

## 2021-05-01 NOTE — Progress Notes (Signed)
PULMONOLOGY         Date: 05/01/2021,   MRN# [id number] Derrick Mockler [facility] [birth year]     AdmissionWeight: 99.8 kg                 CurrentWeight: 99.8 kg   Referring physician: Dr Dwyane Dee   CHIEF COMPLAINT:   Left lung complete atelectasis with mucus plugging.    HISTORY OF PRESENT ILLNESS   This is a pleasant male with hx of DM , dyslipidemia, HTN, admitted with abd pain evening of 04/26/21.  He was noted to have signs and symptoms of sepsis with circulatory shock responsive to IVF.  He had CT imaging abdomen which revealed perforated viscus. He was seen by surgery and is s/p operative intervention with ex lap and sigmoid colectomy and appendectomy.   04/28/21- patient improved clinically. Continue current care plan. No need for bronchoscopy at this time. Left sided infiltrate/atelectasis as below.     Narrative & Impression  CLINICAL DATA:  COVID positive patient. Follow-up left atelectasis. Clinical symptoms improving.   EXAM: PORTABLE CHEST 1 VIEW   COMPARISON:  04/27/2021 and older exams.   FINDINGS: There has been a significant interval improvement in lung aeration, with most of the previously seen left mid to lower lung opacity resolving. There are subtle residual areas of opacity bilaterally. No new lung abnormalities.   No convincing pleural effusion.  No pneumothorax.   Cardiac silhouette is normal in size. Normal mediastinal and hilar contours.   Nasal/orogastric tube is stable, tip in the mid stomach.   IMPRESSION: 1. Significant interval improvement with near complete resolution of the left mid to lower lung opacities.     Electronically Signed   By: Lajean Manes M.D.   On: 04/28/2021 10:12      04/29/21- patient doing well on room air. Continue bronchopulmonary hygiene and PT.     04/30/21- patient is very appreciative of improvement.  He reports resolution of cough and left chest discomfort. He is on room air, reports overall clinical  improvement.  He found help at home post DC.  He is walking with less pain/dyspnea.  Repeat CXR today in process.   05/01/21- patient improved clinically, Chest X ray is further improved. Ordered occupational and physical therapy to initiate today if OK with TRH and surgery. May initiate DC planning.   PAST MEDICAL HISTORY   Past Medical History:  Diagnosis Date   Diabetes ([place])    High cholesterol    Hypertension      SURGICAL HISTORY   Past Surgical History:  Procedure Laterality Date   ABDOMINAL SURGERY     APPENDECTOMY  04/26/2021   Procedure: APPENDECTOMY;  Surgeon: Herbert Pun, MD;  Location: [facility] ORS;  Service: General;;   COLON RESECTION SIGMOID  04/26/2021   Procedure: partial colectomy with anastomosis;  Surgeon: Herbert Pun, MD;  Location: [facility] ORS;  Service: General;;   COLONOSCOPY N/A 03/09/2016   Procedure: COLONOSCOPY;  Surgeon: Wilford Corner, MD;  Location: [facility] ENDOSCOPY;  Service: Endoscopy;  Laterality: N/A;   ILEOSTOMY  04/26/2021   Procedure: ILEOSTOMY Creation;  Surgeon: Herbert Pun, MD;  Location: [facility] ORS;  Service: General;;   INCISIONAL HERNIA REPAIR  04/26/2021   Procedure: HERNIA REPAIR INCISIONAL;  Surgeon: Herbert Pun, MD;  Location: [facility] ORS;  Service: General;;     FAMILY HISTORY   History reviewed. No pertinent family history.   SOCIAL HISTORY   Social History   Tobacco Use   Smoking status:  Every Day    Types: Cigars    Last attempt to quit: 10/23/2017    Years since quitting: 3.5   Smokeless tobacco: Never  Vaping Use   Vaping Use: Never used  Substance Use Topics   Alcohol use: Yes    Alcohol/week: 30.0 standard drinks    Types: 30 Cans of beer per week   Drug use: Yes    Types: Cocaine, Marijuana     MEDICATIONS    Home Medication:    Current Medication:  Current Facility-Administered Medications:    0.9 %  sodium chloride infusion, , Intravenous, Continuous, Val Riles, MD, Last  Rate: 50 mL/hr at 04/30/21 1809, New Bag at 04/30/21 1809   albuterol (VENTOLIN HFA) 108 (90 Base) MCG/ACT inhaler 2 puff, 2 puff, Inhalation, Q6H, Val Riles, MD, 2 puff at 05/01/21 0918   atorvastatin (LIPITOR) tablet 20 mg, 20 mg, Oral, QHS, Val Riles, MD, 20 mg at 04/30/21 2019   dexamethasone (DECADRON) injection 6 mg, 6 mg, Intravenous, Q24H, Val Riles, MD, 6 mg at 04/30/21 1414   enoxaparin (LOVENOX) injection 50 mg, 0.5 mg/kg, Subcutaneous, Q24H, Cintron-Diaz, Edgardo, MD, 50 mg at 05/01/21 0919   gabapentin (NEURONTIN) capsule 300 mg, 300 mg, Oral, BID, Herbert Pun, MD, 300 mg at 05/01/21 0918   guaiFENesin (MUCINEX) 12 hr tablet 600 mg, 600 mg, Oral, BID, Val Riles, MD, 600 mg at 05/01/21 [id number]   hydrALAZINE (APRESOLINE) injection 5 mg, 5 mg, Intravenous, Q4H PRN, Herbert Pun, MD   HYDROcodone-acetaminophen (NORCO/VICODIN) 5-325 MG per tablet 1-2 tablet, 1-2 tablet, Oral, Q4H PRN, Herbert Pun, MD, 2 tablet at 04/28/21 1612   HYDROmorphone (DILAUDID) injection 1 mg, 1 mg, Intravenous, Q4H PRN, Piscoya, Jose, MD, 1 mg at 05/01/21 0307   insulin aspart (novoLOG) injection 0-15 Units, 0-15 Units, Subcutaneous, TID WC, Herbert Pun, MD, 3 Units at 05/01/21 0920   insulin aspart (novoLOG) injection 2 Units, 2 Units, Subcutaneous, TID WC, Wieting, Richard, MD, 2 Units at 05/01/21 0919   ipratropium (ATROVENT HFA) inhaler 2 puff, 2 puff, Inhalation, Q6H, Val Riles, MD, 2 puff at 05/01/21 0918   ketorolac (TORADOL) 30 MG/ML injection 30 mg, 30 mg, Intravenous, Q6H, Cintron-Diaz, Edgardo, MD, 30 mg at 05/01/21 0606   ondansetron (ZOFRAN-ODT) disintegrating tablet 4 mg, 4 mg, Oral, Q6H PRN **OR** ondansetron (ZOFRAN) injection 4 mg, 4 mg, Intravenous, Q6H PRN, Cintron-Diaz, Edgardo, MD   pantoprazole (PROTONIX) EC tablet 40 mg, 40 mg, Oral, QHS, Cintron-Diaz, Edgardo, MD, 40 mg at 04/30/21 2019    ALLERGIES   Patient has no known  allergies.     REVIEW OF SYSTEMS    Review of Systems:  Gen:  Denies  fever, sweats, chills weigh loss  HEENT: Denies blurred vision, double vision, ear pain, eye pain, hearing loss, nose bleeds, sore throat Cardiac:  No dizziness, chest pain or heaviness, chest tightness,edema Resp:   Denies cough or sputum porduction, shortness of breath,wheezing, hemoptysis,  Gi: Denies swallowing difficulty, stomach pain, nausea or vomiting, diarrhea, constipation, bowel incontinence Gu:  Denies bladder incontinence, burning urine Ext:   Denies Joint pain, stiffness or swelling Skin: Denies  skin rash, easy bruising or bleeding or hives Endoc:  Denies polyuria, polydipsia , polyphagia or weight change Psych:   Denies depression, insomnia or hallucinations   Other:  All other systems negative   VS: BP (!) 144/91    Pulse (!) 50    Temp 97.9 F (36.6 C)    Resp 18    Ht 5'  11" (1.803 m)    Wt 99.8 kg    SpO2 98%    BMI 30.68 kg/m      PHYSICAL EXAM    GENERAL:NAD, no fevers, chills, no weakness no fatigue HEAD: Normocephalic, atraumatic.  EYES: Pupils equal, round, reactive to light. Extraocular muscles intact. No scleral icterus.  MOUTH: Moist mucosal membrane. Dentition intact. No abscess noted.  EAR, NOSE, THROAT: Clear without exudates. No external lesions.  NECK: Supple. No thyromegaly. No nodules. No JVD.  PULMONARY: Improved lung sounds with resolution of rhonchi CARDIOVASCULAR: S1 and S2. Regular rate and rhythm. No murmurs, rubs, or gallops. No edema. Pedal pulses 2+ bilaterally.  GASTROINTESTINAL: No masses. Positive bowel sounds. No hepatosplenomegaly.  +surgical wound with dressing tender to touch MUSCULOSKELETAL: No swelling, clubbing, or edema. Range of motion full in all extremities.  NEUROLOGIC: Cranial nerves II through XII are intact. No gross focal neurological deficits. Sensation intact. Reflexes intact.  SKIN: No ulceration, lesions, rashes, or cyanosis. Skin warm  and dry. Turgor intact.  PSYCHIATRIC: Mood, affect within normal limits. The patient is awake, alert and oriented x 3. Insight, judgment intact.       IMAGING    CT CHEST WO CONTRAST  Result Date: 04/27/2021 CLINICAL DATA:  Difficulty breathing, infiltrate left lung EXAM: CT CHEST WITHOUT CONTRAST TECHNIQUE: Multidetector CT imaging of the chest was performed following the standard protocol without IV contrast. COMPARISON:  Chest radiograph done earlier today FINDINGS: Cardiovascular: There are few tiny coronary artery calcifications. Mediastinum/Nodes: No significant lymphadenopathy seen. Lungs/Pleura: There is 3.6 cm focal ground-glass density in the anterolateral aspect of right upper lobe. There are other smaller foci of ground-glass densities in the medial right mid lung fields and posterior segment of right upper lobe. There are small foci of ground-glass infiltrates in the right middle lobe and right lower lobe. There are linear densities in the posterior right lower lung fields. There is complete atelectasis of left lower lobe. There are filling defects in the left main bronchus. No significant focal abnormality is seen in the left upper lobe. There is minimal left pleural effusion. There is no pneumothorax. Upper Abdomen: Tip of enteric tube is seen in the stomach. There is small amount of pneumoperitoneum adjacent to the liver. Pneumoperitoneum was also seen in the previous CT abdomen done on 04/25/2021. Increased density in the lumen of gallbladder may be related to vicarious contrast excretion into the bile or suggest presence of sludge or tiny stones. There is no wall thickening in gallbladder. Musculoskeletal: Unremarkable. IMPRESSION: There is complete atelectasis in the left lower lobe, possibly due to mucous plugging in the central bronchi. Small left pleural effusion. There are patchy ground-glass infiltrates in the right lung suggesting possible multifocal pneumonia. Small amount of  pneumoperitoneum is noted adjacent to the liver which may suggest recent abdominal surgery or suggest bowel perforation. Other findings as described in the body of the report. Electronically Signed   By: Elmer Picker M.D.   On: 04/27/2021 11:01   CT ABDOMEN PELVIS W CONTRAST  Result Date: 04/26/2021 CLINICAL DATA:  RLQ abdominal pain (Age >= 14y) EXAM: CT ABDOMEN AND PELVIS WITH CONTRAST TECHNIQUE: Multidetector CT imaging of the abdomen and pelvis was performed using the standard protocol following bolus administration of intravenous contrast. CONTRAST:  117mL OMNIPAQUE IOHEXOL 300 MG/ML  SOLN COMPARISON:  None. FINDINGS: Lower chest: No acute abnormality. Hepatobiliary: No focal liver abnormality. No gallstones, gallbladder wall thickening, or pericholecystic fluid. No biliary dilatation. Pancreas: No focal lesion. Normal  pancreatic contour. No surrounding inflammatory changes. No main pancreatic ductal dilatation. Spleen: Normal in size without focal abnormality. Adrenals/Urinary Tract: No adrenal nodule bilaterally. Bilateral kidneys enhance symmetrically. No hydronephrosis. No hydroureter. The urinary bladder is unremarkable. Stomach/Bowel: Stomach is within normal limits. No evidence of bowel wall thickening or dilatation. No pneumatosis. Scattered colonic diverticulosis. Suggestion of appendectomy. Vascular/Lymphatic: No definite portal venous or mesenteric venous gas. The main portal, splenic, superior mesenteric veins are patent. No abdominal aorta or iliac aneurysm. Mild atherosclerotic plaque of the aorta and its branches. No abdominal, pelvic, or inguinal lymphadenopathy. Reproductive: Prostate is unremarkable. Other: Small volume free fluid and small volume pneumoperitoneum. Query origin of pneumoperitoneum and free fluid from a perforation involving the small bowel or rectum (5:43). No organized fluid collection. Musculoskeletal: Small fat containing umbilical hernia with an abdominal defect  of 2.1 cm. Small fat containing left spigelian hernia just inferior to the level of the umbilicus with an abdominal defect of 0.9 cm. Possible tiny fat containing left inguinal hernia. No suspicious lytic or blastic osseous lesions. No acute displaced fracture. Multilevel degenerative changes of the spine. IMPRESSION: 1. Small volume pneumoperitoneum and small volume ascites likely originating from a bowel perforation. Likely origin of perforation includes the small bowel or rectum in the anterior lower abdomen/pelvis. 2. Scattered colonic diverticulosis with no acute diverticulitis. 3. Small fat containing umbilical hernia with an abdominal defect of 2.1 cm. Small fat containing left spigelian hernia just inferior to the level of the umbilicus with an abdominal defect of 0.9 cm. Possible tiny fat containing left inguinal hernia. These results were called by telephone at the time of interpretation on 04/25/2021 at 11:57 pm to provider Dr. Leonides Schanz, who verbally acknowledged these results. Electronically Signed   By: Iven Finn M.D.   On: 04/26/2021 00:03   DG Chest Port 1 View  Result Date: 04/30/2021 CLINICAL DATA:  Left lower lobe infiltrate EXAM: PORTABLE CHEST 1 VIEW COMPARISON:  Radiograph 04/28/2021, chest CT 04/27/2021 FINDINGS: There is a nasogastric tube which passes below the diaphragm, tip excluded by collimation. Unchanged cardiomediastinal silhouette. There is continued improvement in left lower lung aeration. No new airspace disease. No pleural effusion. No pneumothorax. No acute osseous abnormality. IMPRESSION: Continued improvement in left lower lung aeration. No new airspace disease. Electronically Signed   By: Maurine Simmering M.D.   On: 04/30/2021 09:21   DG Chest Port 1 View  Result Date: 04/28/2021 CLINICAL DATA:  COVID positive patient. Follow-up left atelectasis. Clinical symptoms improving. EXAM: PORTABLE CHEST 1 VIEW COMPARISON:  04/27/2021 and older exams. FINDINGS: There has been a  significant interval improvement in lung aeration, with most of the previously seen left mid to lower lung opacity resolving. There are subtle residual areas of opacity bilaterally. No new lung abnormalities. No convincing pleural effusion.  No pneumothorax. Cardiac silhouette is normal in size. Normal mediastinal and hilar contours. Nasal/orogastric tube is stable, tip in the mid stomach. IMPRESSION: 1. Significant interval improvement with near complete resolution of the left mid to lower lung opacities. Electronically Signed   By: Lajean Manes M.D.   On: 04/28/2021 10:12   DG Chest Port 1 View  Result Date: 04/27/2021 CLINICAL DATA:  56 year old male positive COVID-19. Shortness of breath. EXAM: PORTABLE CHEST 1 VIEW COMPARISON:  04/26/2021 portable chest and earlier. FINDINGS: Portable AP semi upright view at 0543 hours. Stable enteric tube. Mildly lower lung volumes and interval substantial opacification of the left medial and lower lung. Some left hilar air bronchograms. Some  leftward shift of the mediastinum. Visible mediastinal contours remain normal. Obscured left hemidiaphragm. No pneumothorax. Right lung appears negative. No pleural effusion is evident. No acute osseous abnormality identified. IMPRESSION: 1. Moderate collapse and/or consolidation of the left lung since 04/26/2021. 2. Negative right lung.  Stable enteric tube. Electronically Signed   By: Genevie Ann M.D.   On: 04/27/2021 06:43   DG Chest Port 1 View  Result Date: 04/26/2021 CLINICAL DATA:  Short of breath, COVID-19 positive EXAM: PORTABLE CHEST 1 VIEW COMPARISON:  11/20/2018, 04/25/2021 FINDINGS: Single frontal view of the chest demonstrates enteric catheter passing below diaphragm tip excluded by collimation. Cardiac silhouette is stable. Increased consolidation at the left lung base with associated volume loss consistent with atelectasis. Small left pleural effusion is not excluded. No pneumothorax. No acute bony abnormalities.  IMPRESSION: 1. Developing left basilar consolidation and associated volume loss, consistent with atelectasis. 2. Enteric catheter, tip excluded by collimation. Electronically Signed   By: Randa Ngo M.D.   On: 04/26/2021 15:18      ASSESSMENT/PLAN     Acute hypoxemic respiratory failure Acute COVID19 infection -Remdesevir antiviral - pharmacy protocol 5 d -vitamin C -zinc -decadron 6mg  IV daily  -encourage to use IS and Acapella device for bronchopulmonary hygiene when able monitoring -PT/OT when possible -procalcitonin, CRP and ferritin trending   Complete left atelectasis -RESOLVED   Hypertonic saline nebulized 3% 72ml once daily   -incentive spirometry  -improved substantially  -repeat CXR  Bowel perforation ([place])     -As per Dr Dwyane Dee S/p ex lap, JP drain intact Continue Zosyn for peritonitis Continue IV fluid for hydration -Management per surgery 1/9 clear liquid diet started by general surgery, bowel functions returned, colostomy working well.    Thank you for allowing me to participate in the care of this patient.      Patient/Family are satisfied with care plan and all questions have been answered.   This document was prepared using Dragon voice recognition software and may include unintentional dictation errors.     Ottie Glazier, M.D.  Division of [place]

## 2021-05-01 NOTE — Evaluation (Signed)
Physical Therapy Evaluation Patient Details Name: Timothy Cameron MRN: [id number] DOB: [birth year] Today's Date: 05/01/2021  History of Present Illness  Patient is a 56 year old male who presented with abdominal pain. Found to have acute perforation of the intestine with emergent exploratory laparotomy performed with sigmoid colectomy and ileostomy performed. COVID positive as incidental finding.  Clinical Impression  Patient is agreeable to PT evaluation. His friend/caregiver was at the bedside. 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  Today, patient was able to ambulate 2 laps in the hallway while managing his IV pole. He reports abdominal pain as 5/10 at rest increasing to 8/10 with activity. He required assistance for bed mobility (caregiver assisted him for trunk support). Otherwise, patient is modified independent with activity. Offered education and tips on getting in and out of bed to protect abdominal incision. Anticipate patient will be able to return home with caregiver assistance with no anticipated PT needs at discharge at this time. PT will follow up while in the  hospital to maximize function and decrease caregiver burden in preparation for home discharge.      Recommendations for follow up therapy are one component of a multi-disciplinary discharge planning process, led by the attending physician.  Recommendations may be updated based on patient status, additional functional criteria and insurance authorization.  Follow Up Recommendations No PT follow up    Assistance Recommended at Discharge PRN  Patient can return home with the following       Equipment Recommendations None recommended by PT  Recommendations for Other Services       Functional Status Assessment Patient has had a recent decline in their functional status and demonstrates the ability to make significant improvements in function in a reasonable and predictable amount of time.     Precautions  / Restrictions Precautions Precautions: None Restrictions Weight Bearing Restrictions: No      Mobility  Bed Mobility Overal bed mobility: Needs Assistance Bed Mobility: Supine to Sit;Sit to Supine     Supine to sit: Min assist;HOB elevated Sit to supine: Supervision   General bed mobility comments: increased time and effort with bed mobility. his caregiver was present and assisted patient with trunk support to sit upright. educated patient on logroll style technique for abdominal incision protection with bed mobility. of note, on arrival to the room, NG tube was clamped and patient placed his own tube back to suction after walking in hallway.    Transfers Overall transfer level: Needs assistance Equipment used: None Transfers: Sit to/from Stand Sit to Stand: Modified independent (Device/Increase time)           General transfer comment: multiple standing bouts performed.    Ambulation/Gait Ambulation/Gait assistance: Modified independent (Device/Increase time);Supervision Gait Distance (Feet): 350 Feet Assistive device: IV Pole Gait Pattern/deviations: Decreased stride length Gait velocity: decreased     General Gait Details: patient ambulated 2 laps in the hallway without loss of balance while pushing the IV pole. no shortness of breath noted. patient does report his pain is 8/10 with mobility with safety cues for activity cessation based on pain level and activity tolerance.  Stairs            Wheelchair Mobility    Modified Rankin (Stroke Patients Only)       Balance Overall balance assessment: Needs assistance Sitting-balance support: Feet supported Sitting balance-Leahy Scale: Good     Standing balance support: No upper extremity supported Standing balance-Leahy Scale: Good  Pertinent Vitals/Pain Pain Assessment: 0-10 Pain Score:  (5/10 at rest and 8/10 with mobility) Pain Location: abdomen Pain  Descriptors / Indicators: Discomfort Pain Intervention(s): Limited activity within patient's tolerance;Monitored during session    Home Living Family/patient expects to be discharged to:: Private residence Living Arrangements: Non-relatives/Friends Available Help at Discharge: Friend(s) Type of Home: House Home Access: Stairs to enter   Secretary/administrator of Steps: 4     Home Equipment: Shower seat      Prior Function Prior Level of Function : Independent/Modified Independent;Working/employed             Mobility Comments: independent and active prior to admission. he is a Curator ADLs Comments: independent     Hand Dominance        Extremity/Trunk Assessment   Upper Extremity Assessment Upper Extremity Assessment: Overall WFL for tasks assessed    Lower Extremity Assessment Lower Extremity Assessment: Overall WFL for tasks assessed       Communication   Communication: No difficulties  Cognition Arousal/Alertness: Awake/alert Behavior During Therapy: WFL for tasks assessed/performed Overall Cognitive Status: Within Functional Limits for tasks assessed                                 General Comments: patient is alert and oriented, able to follow all directions without difficulty        General Comments General comments (skin integrity, edema, etc.): patient ambualted in hallway with COVID precautions recommendation with procedure mask in place. patient was requesting to ambulate in the hallway again today. recommended to alert nurse before attempting to leave the room due to COVID precautions- patient verbalized understanding.    Exercises     Assessment/Plan    PT Assessment Patient needs continued PT services  PT Problem List Pain;Decreased mobility       PT Treatment Interventions Gait training;Stair training;Functional mobility training;Therapeutic exercise;Therapeutic activities    PT Goals (Current goals can be found in the  Care Plan section)  Acute Rehab PT Goals Patient Stated Goal: to return home after bowel function better PT Goal Formulation: With patient Time For Goal Achievement:  (w+2) Potential to Achieve Goals: Good    Frequency Min 2X/week     Co-evaluation               AM-PAC PT "6 Clicks" Mobility  Outcome Measure Help needed turning from your back to your side while in a flat bed without using bedrails?: None Help needed moving from lying on your back to sitting on the side of a flat bed without using bedrails?: A Little Help needed moving to and from a bed to a chair (including a wheelchair)?: None Help needed standing up from a chair using your arms (e.g., wheelchair or bedside chair)?: None Help needed to walk in hospital room?: None Help needed climbing 3-5 steps with a railing? : A Little 6 Click Score: 22    End of Session   Activity Tolerance: Patient tolerated treatment well Patient left: in bed;with call bell/phone within reach;with family/visitor present Nurse Communication: Mobility status (alerted nurse of patient's request to walk in hallway later today) PT Visit Diagnosis: Pain;Other abnormalities of gait and mobility (R26.89) Pain - Right/Left:  (center) Pain - part of body:  (abdomen)    Time: 6812-7517 PT Time Calculation (min) (ACUTE ONLY): 33 min   Charges:   PT Evaluation $PT Eval Low Complexity: 1 Low PT Treatments $Therapeutic Activity:  8-22 mins        Donna Bernard, PT, MPT   Ina Homes 05/01/2021, 3:18 PM

## 2021-05-02 DIAGNOSIS — K5720 Diverticulitis of large intestine with perforation and abscess without bleeding: Secondary | ICD-10-CM | POA: Diagnosis not present

## 2021-05-02 DIAGNOSIS — K631 Perforation of intestine (nontraumatic): Secondary | ICD-10-CM | POA: Diagnosis not present

## 2021-05-02 LAB — GLUCOSE, CAPILLARY
Glucose-Capillary: 220 mg/dL — ABNORMAL HIGH (ref 70–99)
Glucose-Capillary: 263 mg/dL — ABNORMAL HIGH (ref 70–99)
Glucose-Capillary: 234 mg/dL — ABNORMAL HIGH (ref 70–99)
Glucose-Capillary: 292 mg/dL — ABNORMAL HIGH (ref 70–99)
Glucose-Capillary: 348 mg/dL — ABNORMAL HIGH (ref 70–99)

## 2021-05-02 MED ORDER — PHENOL 1.4 % MT LIQD
1.0000 | OROMUCOSAL | Status: DC | PRN
  Administered 2021-05-02: 1 via OROMUCOSAL
  Filled 2021-05-02 (×2): qty 177

## 2021-05-02 MED ORDER — SODIUM CHLORIDE 0.9 % IV SOLN
3.0000 g | Freq: Four times a day (QID) | INTRAVENOUS | Status: DC
  Administered 2021-05-02 – 2021-05-03 (×4): 3 g via INTRAVENOUS
  Filled 2021-05-02 (×3): qty 8
  Filled 2021-05-02 (×4): qty 3
  Filled 2021-05-02: qty 8

## 2021-05-02 NOTE — Progress Notes (Signed)
PULMONOLOGY         Date: 05/02/2021,   MRN# [id number] [facility] 03-15-1966     AdmissionWeight: 99.8 kg                 CurrentWeight: 99.8 kg   Referring physician: Dr Dwyane Dee   CHIEF COMPLAINT:   Left lung complete atelectasis with mucus plugging.    HISTORY OF PRESENT ILLNESS   This is a pleasant male with hx of DM , dyslipidemia, HTN, admitted with abd pain evening of 04/26/21.  He was noted to have signs and symptoms of sepsis with circulatory shock responsive to IVF.  He had CT imaging abdomen which revealed perforated viscus. He was seen by surgery and is s/p operative intervention with ex lap and sigmoid colectomy and appendectomy.   04/28/21- patient improved clinically. Continue current care plan. No need for bronchoscopy at this time. Left sided infiltrate/atelectasis as below.     Narrative & Impression  CLINICAL DATA:  COVID positive patient. Follow-up left atelectasis. Clinical symptoms improving.   EXAM: PORTABLE CHEST 1 VIEW   COMPARISON:  04/27/2021 and older exams.   FINDINGS: There has been a significant interval improvement in lung aeration, with most of the previously seen left mid to lower lung opacity resolving. There are subtle residual areas of opacity bilaterally. No new lung abnormalities.   No convincing pleural effusion.  No pneumothorax.   Cardiac silhouette is normal in size. Normal mediastinal and hilar contours.   Nasal/orogastric tube is stable, tip in the mid stomach.   IMPRESSION: 1. Significant interval improvement with near complete resolution of the left mid to lower lung opacities.     Electronically Signed   By: Lajean Manes M.D.   On: 04/28/2021 10:12      04/29/21- patient doing well on room air. Continue bronchopulmonary hygiene and PT.     04/30/21- patient is very appreciative of improvement.  He reports resolution of cough and left chest discomfort. He is on room air, reports overall clinical  improvement.  He found help at home post DC.  He is walking with less pain/dyspnea.  Repeat CXR today in process.   05/01/21- patient improved clinically, Chest X ray is further improved. Ordered occupational and physical therapy to initiate today if OK with TRH and surgery. May initiate DC planning.   05/02/21- patient seems to be doing great. He had NG tube removed, still on room air reports breathing is strong. Walking around multiple times daily. Can dc steroids no problem today.  I have discussed with patient follow up post dc  PAST MEDICAL HISTORY   Past Medical History:  Diagnosis Date   Diabetes ([place])    High cholesterol    Hypertension      SURGICAL HISTORY   Past Surgical History:  Procedure Laterality Date   ABDOMINAL SURGERY     APPENDECTOMY  04/26/2021   Procedure: APPENDECTOMY;  Surgeon: Herbert Pun, MD;  Location: [facility] ORS;  Service: General;;   COLON RESECTION SIGMOID  04/26/2021   Procedure: partial colectomy with anastomosis;  Surgeon: Herbert Pun, MD;  Location: [facility] ORS;  Service: General;;   COLONOSCOPY N/A 03/09/2016   Procedure: COLONOSCOPY;  Surgeon: Wilford Corner, MD;  Location: [facility] ENDOSCOPY;  Service: Endoscopy;  Laterality: N/A;   ILEOSTOMY  04/26/2021   Procedure: ILEOSTOMY Creation;  Surgeon: Herbert Pun, MD;  Location: [facility] ORS;  Service: General;;   INCISIONAL HERNIA REPAIR  04/26/2021   Procedure: HERNIA REPAIR INCISIONAL;  Surgeon: Herbert Pun, MD;  Location: [facility] ORS;  Service: General;;     FAMILY HISTORY   History reviewed. No pertinent family history.   SOCIAL HISTORY   Social History   Tobacco Use   Smoking status: Every Day    Types: Cigars    Last attempt to quit: 10/23/2017    Years since quitting: 3.5   Smokeless tobacco: Never  Vaping Use   Vaping Use: Never used  Substance Use Topics   Alcohol use: Yes    Alcohol/week: 30.0 standard drinks    Types: 30 Cans of beer per week   Drug use:  Yes    Types: Cocaine, Marijuana     MEDICATIONS    Home Medication:    Current Medication:  Current Facility-Administered Medications:    albuterol (VENTOLIN HFA) 108 (90 Base) MCG/ACT inhaler 2 puff, 2 puff, Inhalation, Q6H, Val Riles, MD, 2 puff at 05/02/21 1314   Ampicillin-Sulbactam (UNASYN) 3 g in sodium chloride 0.9 % 100 mL IVPB, 3 g, Intravenous, Q6H, Ravishankar, Jayashree, MD, Last Rate: 200 mL/hr at 05/02/21 1714, 3 g at 05/02/21 1714   atorvastatin (LIPITOR) tablet 20 mg, 20 mg, Oral, QHS, Kumar, Dileep, MD, 20 mg at 05/01/21 2248   enoxaparin (LOVENOX) injection 50 mg, 0.5 mg/kg, Subcutaneous, Q24H, Cintron-Diaz, Edgardo, MD, 50 mg at 05/02/21 1015   gabapentin (NEURONTIN) capsule 300 mg, 300 mg, Oral, BID, Cintron-Diaz, Edgardo, MD, 300 mg at 05/02/21 1031   guaiFENesin (MUCINEX) 12 hr tablet 600 mg, 600 mg, Oral, BID, Val Riles, MD, 600 mg at 05/02/21 1031   hydrALAZINE (APRESOLINE) injection 5 mg, 5 mg, Intravenous, Q4H PRN, Herbert Pun, MD   HYDROcodone-acetaminophen (NORCO/VICODIN) 5-325 MG per tablet 1-2 tablet, 1-2 tablet, Oral, Q4H PRN, Herbert Pun, MD, 1 tablet at 05/01/21 1654   HYDROmorphone (DILAUDID) injection 1 mg, 1 mg, Intravenous, Q4H PRN, Piscoya, Jose, MD, 1 mg at 05/02/21 1224   insulin aspart (novoLOG) injection 0-15 Units, 0-15 Units, Subcutaneous, TID WC, Herbert Pun, MD, 8 Units at 05/02/21 1714   insulin aspart (novoLOG) injection 2 Units, 2 Units, Subcutaneous, TID WC, Wieting, Richard, MD, 2 Units at 05/02/21 1715   ipratropium (ATROVENT HFA) inhaler 2 puff, 2 puff, Inhalation, Q6H, Val Riles, MD, 2 puff at 05/02/21 1313   losartan (COZAAR) tablet 25 mg, 25 mg, Oral, Daily, Wieting, Richard, MD, 25 mg at 05/02/21 1031   ondansetron (ZOFRAN-ODT) disintegrating tablet 4 mg, 4 mg, Oral, Q6H PRN **OR** ondansetron (ZOFRAN) injection 4 mg, 4 mg, Intravenous, Q6H PRN, Cintron-Diaz, Edgardo, MD   pantoprazole  (PROTONIX) EC tablet 40 mg, 40 mg, Oral, QHS, Cintron-Diaz, Edgardo, MD, 40 mg at 05/01/21 2248   phenol (CHLORASEPTIC) mouth spray 1 spray, 1 spray, Mouth/Throat, PRN, Herbert Pun, MD, 1 spray at 05/02/21 1314    ALLERGIES   Patient has no known allergies.     REVIEW OF SYSTEMS    Review of Systems:  Gen:  Denies  fever, sweats, chills weigh loss  HEENT: Denies blurred vision, double vision, ear pain, eye pain, hearing loss, nose bleeds, sore throat Cardiac:  No dizziness, chest pain or heaviness, chest tightness,edema Resp:   Denies cough or sputum porduction, shortness of breath,wheezing, hemoptysis,  Gi: Denies swallowing difficulty, stomach pain, nausea or vomiting, diarrhea, constipation, bowel incontinence Gu:  Denies bladder incontinence, burning urine Ext:   Denies Joint pain, stiffness or swelling Skin: Denies  skin rash, easy bruising or bleeding or hives Endoc:  Denies polyuria, polydipsia , polyphagia or weight  change Psych:   Denies depression, insomnia or hallucinations   Other:  All other systems negative   VS: BP 137/80 (BP Location: Left Arm)    Pulse 60    Temp 98.6 F (37 C) (Axillary)    Resp 16    Ht 5\' 11"  (1.803 m)    Wt 99.8 kg    SpO2 100%    BMI 30.68 kg/m      PHYSICAL EXAM    GENERAL:NAD, no fevers, chills, no weakness no fatigue HEAD: Normocephalic, atraumatic.  EYES: Pupils equal, round, reactive to light. Extraocular muscles intact. No scleral icterus.  MOUTH: Moist mucosal membrane. Dentition intact. No abscess noted.  EAR, NOSE, THROAT: Clear without exudates. No external lesions.  NECK: Supple. No thyromegaly. No nodules. No JVD.  PULMONARY: Improved lung sounds with resolution of rhonchi CARDIOVASCULAR: S1 and S2. Regular rate and rhythm. No murmurs, rubs, or gallops. No edema. Pedal pulses 2+ bilaterally.  GASTROINTESTINAL: No masses. Positive bowel sounds. No hepatosplenomegaly.  +surgical wound with dressing tender to  touch MUSCULOSKELETAL: No swelling, clubbing, or edema. Range of motion full in all extremities.  NEUROLOGIC: Cranial nerves II through XII are intact. No gross focal neurological deficits. Sensation intact. Reflexes intact.  SKIN: No ulceration, lesions, rashes, or cyanosis. Skin warm and dry. Turgor intact.  PSYCHIATRIC: Mood, affect within normal limits. The patient is awake, alert and oriented x 3. Insight, judgment intact.       IMAGING    CT CHEST WO CONTRAST  Result Date: 04/27/2021 CLINICAL DATA:  Difficulty breathing, infiltrate left lung EXAM: CT CHEST WITHOUT CONTRAST TECHNIQUE: Multidetector CT imaging of the chest was performed following the standard protocol without IV contrast. COMPARISON:  Chest radiograph done earlier today FINDINGS: Cardiovascular: There are few tiny coronary artery calcifications. Mediastinum/Nodes: No significant lymphadenopathy seen. Lungs/Pleura: There is 3.6 cm focal ground-glass density in the anterolateral aspect of right upper lobe. There are other smaller foci of ground-glass densities in the medial right mid lung fields and posterior segment of right upper lobe. There are small foci of ground-glass infiltrates in the right middle lobe and right lower lobe. There are linear densities in the posterior right lower lung fields. There is complete atelectasis of left lower lobe. There are filling defects in the left main bronchus. No significant focal abnormality is seen in the left upper lobe. There is minimal left pleural effusion. There is no pneumothorax. Upper Abdomen: Tip of enteric tube is seen in the stomach. There is small amount of pneumoperitoneum adjacent to the liver. Pneumoperitoneum was also seen in the previous CT abdomen done on 04/25/2021. Increased density in the lumen of gallbladder may be related to vicarious contrast excretion into the bile or suggest presence of sludge or tiny stones. There is no wall thickening in gallbladder.  Musculoskeletal: Unremarkable. IMPRESSION: There is complete atelectasis in the left lower lobe, possibly due to mucous plugging in the central bronchi. Small left pleural effusion. There are patchy ground-glass infiltrates in the right lung suggesting possible multifocal pneumonia. Small amount of pneumoperitoneum is noted adjacent to the liver which may suggest recent abdominal surgery or suggest bowel perforation. Other findings as described in the body of the report. Electronically Signed   By: Elmer Picker M.D.   On: 04/27/2021 11:01   CT ABDOMEN PELVIS W CONTRAST  Result Date: 04/26/2021 CLINICAL DATA:  RLQ abdominal pain (Age >= 14y) EXAM: CT ABDOMEN AND PELVIS WITH CONTRAST TECHNIQUE: Multidetector CT imaging of the abdomen and pelvis was  performed using the standard protocol following bolus administration of intravenous contrast. CONTRAST:  167mL OMNIPAQUE IOHEXOL 300 MG/ML  SOLN COMPARISON:  None. FINDINGS: Lower chest: No acute abnormality. Hepatobiliary: No focal liver abnormality. No gallstones, gallbladder wall thickening, or pericholecystic fluid. No biliary dilatation. Pancreas: No focal lesion. Normal pancreatic contour. No surrounding inflammatory changes. No main pancreatic ductal dilatation. Spleen: Normal in size without focal abnormality. Adrenals/Urinary Tract: No adrenal nodule bilaterally. Bilateral kidneys enhance symmetrically. No hydronephrosis. No hydroureter. The urinary bladder is unremarkable. Stomach/Bowel: Stomach is within normal limits. No evidence of bowel wall thickening or dilatation. No pneumatosis. Scattered colonic diverticulosis. Suggestion of appendectomy. Vascular/Lymphatic: No definite portal venous or mesenteric venous gas. The main portal, splenic, superior mesenteric veins are patent. No abdominal aorta or iliac aneurysm. Mild atherosclerotic plaque of the aorta and its branches. No abdominal, pelvic, or inguinal lymphadenopathy. Reproductive: Prostate is  unremarkable. Other: Small volume free fluid and small volume pneumoperitoneum. Query origin of pneumoperitoneum and free fluid from a perforation involving the small bowel or rectum (5:43). No organized fluid collection. Musculoskeletal: Small fat containing umbilical hernia with an abdominal defect of 2.1 cm. Small fat containing left spigelian hernia just inferior to the level of the umbilicus with an abdominal defect of 0.9 cm. Possible tiny fat containing left inguinal hernia. No suspicious lytic or blastic osseous lesions. No acute displaced fracture. Multilevel degenerative changes of the spine. IMPRESSION: 1. Small volume pneumoperitoneum and small volume ascites likely originating from a bowel perforation. Likely origin of perforation includes the small bowel or rectum in the anterior lower abdomen/pelvis. 2. Scattered colonic diverticulosis with no acute diverticulitis. 3. Small fat containing umbilical hernia with an abdominal defect of 2.1 cm. Small fat containing left spigelian hernia just inferior to the level of the umbilicus with an abdominal defect of 0.9 cm. Possible tiny fat containing left inguinal hernia. These results were called by telephone at the time of interpretation on 04/25/2021 at 11:57 pm to provider Dr. Leonides Schanz, who verbally acknowledged these results. Electronically Signed   By: Iven Finn M.D.   On: 04/26/2021 00:03   DG Chest Port 1 View  Result Date: 04/30/2021 CLINICAL DATA:  Left lower lobe infiltrate EXAM: PORTABLE CHEST 1 VIEW COMPARISON:  Radiograph 04/28/2021, chest CT 04/27/2021 FINDINGS: There is a nasogastric tube which passes below the diaphragm, tip excluded by collimation. Unchanged cardiomediastinal silhouette. There is continued improvement in left lower lung aeration. No new airspace disease. No pleural effusion. No pneumothorax. No acute osseous abnormality. IMPRESSION: Continued improvement in left lower lung aeration. No new airspace disease. Electronically  Signed   By: Maurine Simmering M.D.   On: 04/30/2021 09:21   DG Chest Port 1 View  Result Date: 04/28/2021 CLINICAL DATA:  COVID positive patient. Follow-up left atelectasis. Clinical symptoms improving. EXAM: PORTABLE CHEST 1 VIEW COMPARISON:  04/27/2021 and older exams. FINDINGS: There has been a significant interval improvement in lung aeration, with most of the previously seen left mid to lower lung opacity resolving. There are subtle residual areas of opacity bilaterally. No new lung abnormalities. No convincing pleural effusion.  No pneumothorax. Cardiac silhouette is normal in size. Normal mediastinal and hilar contours. Nasal/orogastric tube is stable, tip in the mid stomach. IMPRESSION: 1. Significant interval improvement with near complete resolution of the left mid to lower lung opacities. Electronically Signed   By: Lajean Manes M.D.   On: 04/28/2021 10:12   DG Chest Port 1 View  Result Date: 04/27/2021 CLINICAL DATA:  56 year old male  positive COVID-19. Shortness of breath. EXAM: PORTABLE CHEST 1 VIEW COMPARISON:  04/26/2021 portable chest and earlier. FINDINGS: Portable AP semi upright view at 0543 hours. Stable enteric tube. Mildly lower lung volumes and interval substantial opacification of the left medial and lower lung. Some left hilar air bronchograms. Some leftward shift of the mediastinum. Visible mediastinal contours remain normal. Obscured left hemidiaphragm. No pneumothorax. Right lung appears negative. No pleural effusion is evident. No acute osseous abnormality identified. IMPRESSION: 1. Moderate collapse and/or consolidation of the left lung since 04/26/2021. 2. Negative right lung.  Stable enteric tube. Electronically Signed   By: Genevie Ann M.D.   On: 04/27/2021 06:43   DG Chest Port 1 View  Result Date: 04/26/2021 CLINICAL DATA:  Short of breath, COVID-19 positive EXAM: PORTABLE CHEST 1 VIEW COMPARISON:  11/20/2018, 04/25/2021 FINDINGS: Single frontal view of the chest demonstrates  enteric catheter passing below diaphragm tip excluded by collimation. Cardiac silhouette is stable. Increased consolidation at the left lung base with associated volume loss consistent with atelectasis. Small left pleural effusion is not excluded. No pneumothorax. No acute bony abnormalities. IMPRESSION: 1. Developing left basilar consolidation and associated volume loss, consistent with atelectasis. 2. Enteric catheter, tip excluded by collimation. Electronically Signed   By: Randa Ngo M.D.   On: 04/26/2021 15:18      ASSESSMENT/PLAN     Acute hypoxemic respiratory failure Acute COVID19 infection -Remdesevir antiviral - pharmacy protocol 5 d -vitamin C -zinc -decadron 6mg  IV daily >>>No steroids now -encourage to use IS and Acapella device for bronchopulmonary hygiene when able monitoring -PT/OT when possible -procalcitonin, CRP and ferritin trending   Complete left atelectasis -RESOLVED   Hypertonic saline nebulized 3% 86ml once daily   -incentive spirometry  -improved substantially  -repeat CXR reviewed  Bowel perforation ([place])     -As perTTRH and surgery Dr Peyton Najjar S/p ex lap, JP drain intact Continue Zosyn for peritonitis Continue IV fluid for hydration -Management per surgery 1/9 clear liquid diet started by general surgery, bowel functions returned, colostomy working well.    Thank you for allowing me to participate in the care of this patient.      Patient/Family are satisfied with care plan and all questions have been answered.   This document was prepared using Dragon voice recognition software and may include unintentional dictation errors.     Ottie Glazier, M.D.  Division of [place]

## 2021-05-02 NOTE — Consult Note (Signed)
NAME: Timothy Cameron  DOB: [birth year]  MRN: [id number]  Date/Time: 05/02/2021 1:15 PM  REQUESTING PROVIDER:Dr.Anwar  Subjective:  REASON FOR CONSULT: perforated diverticulits  ? Timothy Cameron is a 56 y.o.male  with a history of DM, HTN, HLD, past h/o intraabdominal surgery, colostomy and reversal presented with sudden onset rt sided lower abdominal pain  on 04/25/21. Vitals in the ED BP 94/68, Temp 97.8, HR 83 Sats 93% Wbc 18.5, HB 18.6, Cr 1.28, PLT 216 He tested + for COVID but not symptomatic CT abdomen showed Small volume pneumoperitoneum and small volume ascites likely originating from a bowel perforation. Likely origin of perforation includes the small bowel or rectum in the anterior lower abdomen/pelvis.Blood culture sent and started on zosyn Surgeon took him to OR on 04/26/21 and he underwent  Exploratory Laparotomy                      Sigmoid colectomy with anastomosis                      Creation of loop ileostomy                      Appendectomy                      Incisional hernia repair, incarcerated, 4 cm There was feculent and purulent fluid in the abdomen I am asked to see the patient for gram neg bacteremia' Surgical culture was enterococcus and lactobacillus Blood culture was staph epi and a gram neg rod which could not be identified and has been sent out Pt is on zosyn and he Is doing much better Walking and cleaning his toilet- says he is bored Has NG tube but no nausea No vomiting. Tolerated liquids and soft food Ileostomy working No Sob Past Medical History:  Diagnosis Date   Diabetes ([place])    High cholesterol    Hypertension     Past Surgical History:  Procedure Laterality Date   ABDOMINAL SURGERY     APPENDECTOMY  04/26/2021   Procedure: APPENDECTOMY;  Surgeon: Herbert Pun, MD;  Location: [facility] ORS;  Service: General;;   COLON RESECTION SIGMOID  04/26/2021   Procedure: partial colectomy with anastomosis;  Surgeon: Herbert Pun, MD;   Location: [facility] ORS;  Service: General;;   COLONOSCOPY N/A 03/09/2016   Procedure: COLONOSCOPY;  Surgeon: Wilford Corner, MD;  Location: [facility] ENDOSCOPY;  Service: Endoscopy;  Laterality: N/A;   ILEOSTOMY  04/26/2021   Procedure: ILEOSTOMY Creation;  Surgeon: Herbert Pun, MD;  Location: [facility] ORS;  Service: General;;   INCISIONAL HERNIA REPAIR  04/26/2021   Procedure: HERNIA REPAIR INCISIONAL;  Surgeon: Herbert Pun, MD;  Location: [facility] ORS;  Service: General;;    Social History   Socioeconomic History   Marital status: Single    Spouse name: Not on file   Number of children: Not on file   Years of education: Not on file   Highest education level: Not on file  Occupational History   Not on file  Tobacco Use   Smoking status: Every Day    Types: Cigars    Last attempt to quit: 10/23/2017    Years since quitting: 3.5   Smokeless tobacco: Never  Vaping Use   Vaping Use: Never used  Substance and Sexual Activity   Alcohol use: Yes    Alcohol/week: 30.0 standard drinks    Types: 30 Cans of beer per week  Drug use: Yes    Types: Cocaine, Marijuana   Sexual activity: Not on file  Other Topics Concern   Not on file  Social History Narrative   Not on file   Social Determinants of Health   Financial Resource Strain: Not on file  Food Insecurity: Not on file  Transportation Needs: Not on file  Physical Activity: Not on file  Stress: Not on file  Social Connections: Not on file  Intimate Partner Violence: Not on file    History reviewed. No pertinent family history. No Known Allergies I? Current Facility-Administered Medications  Medication Dose Route Frequency Provider Last Rate Last Admin   0.9 %  sodium chloride infusion   Intravenous Continuous Val Riles, MD 50 mL/hr at 04/30/21 1809 New Bag at 04/30/21 1809   albuterol (VENTOLIN HFA) 108 (90 Base) MCG/ACT inhaler 2 puff  2 puff Inhalation Q6H Val Riles, MD   2 puff at 05/02/21 1314   atorvastatin  (LIPITOR) tablet 20 mg  20 mg Oral QHS Val Riles, MD   20 mg at 05/01/21 2248   enoxaparin (LOVENOX) injection 50 mg  0.5 mg/kg Subcutaneous Q24H Herbert Pun, MD   50 mg at 05/02/21 1015   gabapentin (NEURONTIN) capsule 300 mg  300 mg Oral BID Herbert Pun, MD   300 mg at 05/02/21 1031   guaiFENesin (MUCINEX) 12 hr tablet 600 mg  600 mg Oral BID Val Riles, MD   600 mg at 05/02/21 1031   hydrALAZINE (APRESOLINE) injection 5 mg  5 mg Intravenous Q4H PRN Herbert Pun, MD       HYDROcodone-acetaminophen (NORCO/VICODIN) 5-325 MG per tablet 1-2 tablet  1-2 tablet Oral Q4H PRN Herbert Pun, MD   1 tablet at 05/01/21 1654   HYDROmorphone (DILAUDID) injection 1 mg  1 mg Intravenous Q4H PRN Piscoya, Jose, MD   1 mg at 05/02/21 1224   insulin aspart (novoLOG) injection 0-15 Units  0-15 Units Subcutaneous TID WC Herbert Pun, MD   5 Units at 05/02/21 1227   insulin aspart (novoLOG) injection 2 Units  2 Units Subcutaneous TID WC Loletha Grayer, MD   2 Units at 05/02/21 1228   ipratropium (ATROVENT HFA) inhaler 2 puff  2 puff Inhalation Q6H Val Riles, MD   2 puff at 05/02/21 1313   losartan (COZAAR) tablet 25 mg  25 mg Oral Daily Loletha Grayer, MD   25 mg at 05/02/21 1031   methylPREDNISolone (MEDROL DOSEPAK) tablet 4 mg  4 mg Oral 3 x daily with food Ottie Glazier, MD   4 mg at 05/02/21 1312   [START ON 05/03/2021] methylPREDNISolone (MEDROL DOSEPAK) tablet 4 mg  4 mg Oral 4X daily taper Ottie Glazier, MD       methylPREDNISolone (MEDROL DOSEPAK) tablet 8 mg  8 mg Oral Nightly Ottie Glazier, MD       ondansetron (ZOFRAN-ODT) disintegrating tablet 4 mg  4 mg Oral Q6H PRN Herbert Pun, MD       Or   ondansetron (ZOFRAN) injection 4 mg  4 mg Intravenous Q6H PRN Herbert Pun, MD       pantoprazole (PROTONIX) EC tablet 40 mg  40 mg Oral QHS Herbert Pun, MD   40 mg at 05/01/21 2248   phenol (CHLORASEPTIC) mouth spray 1  spray  1 spray Mouth/Throat PRN Herbert Pun, MD   1 spray at 05/02/21 1314   piperacillin-tazobactam (ZOSYN) IVPB 3.375 g  3.375 g Intravenous Q8H Dallie Piles, RPH 12.5 mL/hr at 05/02/21 1019 3.375  g at 05/02/21 1019     Abtx:  Anti-infectives (From admission, onward)    Start     Dose/Rate Route Frequency Ordered Stop   05/01/21 1600  piperacillin-tazobactam (ZOSYN) IVPB 3.375 g        3.375 g 12.5 mL/hr over 240 Minutes Intravenous Every 8 hours 05/01/21 1418     05/01/21 1515  piperacillin-tazobactam (ZOSYN) IVPB 3.375 g  Status:  Discontinued        3.375 g 12.5 mL/hr over 240 Minutes Intravenous Every 8 hours 05/01/21 1417 05/01/21 1418   04/29/21 1445  azithromycin (ZITHROMAX) tablet 500 mg  Status:  Discontinued        500 mg Oral Daily 04/29/21 1345 04/29/21 1347   04/29/21 1445  azithromycin (ZITHROMAX) tablet 500 mg        500 mg Oral Daily 04/29/21 1347 05/01/21 0918   04/28/21 1000  remdesivir 100 mg in sodium chloride 0.9 % 100 mL IVPB       See Hyperspace for full Linked Orders Report.   100 mg 200 mL/hr over 30 Minutes Intravenous Daily 04/27/21 1342 05/01/21 0957   04/27/21 1430  remdesivir 200 mg in sodium chloride 0.9% 250 mL IVPB       See Hyperspace for full Linked Orders Report.   200 mg 580 mL/hr over 30 Minutes Intravenous Once 04/27/21 1342 04/27/21 1541   04/27/21 1400  azithromycin (ZITHROMAX) 500 mg in sodium chloride 0.9 % 250 mL IVPB  Status:  Discontinued        500 mg 250 mL/hr over 60 Minutes Intravenous Every 24 hours 04/27/21 1308 04/29/21 1345   04/26/21 1400  piperacillin-tazobactam (ZOSYN) IVPB 3.375 g        3.375 g 12.5 mL/hr over 240 Minutes Intravenous Every 8 hours 04/26/21 0810 05/01/21 1006   04/26/21 0600  piperacillin-tazobactam (ZOSYN) IVPB 3.375 g  Status:  Discontinued        3.375 g 12.5 mL/hr over 240 Minutes Intravenous Every 8 hours 04/26/21 0112 04/26/21 0756   04/26/21 0513  piperacillin-tazobactam (ZOSYN) 3.375  GM/50ML IVPB       Note to Pharmacy: Callie Fielding C: cabinet override      04/26/21 0513 04/26/21 1729   04/26/21 0030  piperacillin-tazobactam (ZOSYN) IVPB 3.375 g        3.375 g 100 mL/hr over 30 Minutes Intravenous  Once 04/26/21 0024 04/26/21 0116       REVIEW OF SYSTEMS:  Const: negative fever, negative chills, negative weight loss Eyes: negative diplopia or visual changes, negative eye pain ENT: negative coryza, negative sore throat Resp: negative cough, hemoptysis, dyspnea Cards: negative for chest pain, palpitations, lower extremity edema GU: negative for frequency, dysuria and hematuria GI:as above Skin: negative for rash and pruritus Heme: negative for easy bruising and gum/nose bleeding MS: negative for myalgias, arthralgias, back pain and muscle weakness Neurolo:negative for headaches, dizziness, vertigo, memory problems  Psych: negative for feelings of anxiety, depression  Endocrine:  diabetes Allergy/Immunology- negative for any medication or food allergies ? Objective:  VITALS:  BP (!) 160/94 (BP Location: Left Arm)    Pulse (!) 58    Temp 97.8 F (36.6 C)    Resp 18    Ht 5\' 11"  (1.803 m)    Wt 99.8 kg    SpO2 100%    BMI 30.68 kg/m  PHYSICAL EXAM:  General: Alert, cooperative, no distress, appears stated age.  Head: Normocephalic, without obvious abnormality, atraumatic. Eyes: Conjunctivae clear, anicteric sclerae. Pupils  are equal ENT Nares normal. No drainage or sinus tenderness. Lips, mucosa, and tongue normal. No Thrush NG tube Neck: Supple, symmetrical, no adenopathy, thyroid: non tender no carotid bruit and no JVD. Back: No CVA tenderness. Lungs: Clear to auscultation bilaterally. No Wheezing or Rhonchi. No rales. Heart: Regular rate and rhythm, no murmur, rub or gallop. Abdomen: ileostomy Lap sutures JP drain eft side Extremities: atraumatic, no cyanosis. No edema. No clubbing Skin: No rashes or lesions. Or bruising Lymph: Cervical,  supraclavicular normal. Neurologic: Grossly non-focal Pertinent Labs Lab Results CBC    Component Value Date/Time   WBC 6.9 04/30/2021 0515   RBC 4.41 04/30/2021 0515   HGB 14.0 04/30/2021 0515   HGB 17.8 11/25/2012 1712   HCT 40.4 04/30/2021 0515   HCT 49.8 11/25/2012 1712   PLT 186 04/30/2021 0515   PLT 188 11/25/2012 1712   MCV 91.6 04/30/2021 0515   MCV 90 11/25/2012 1712   MCH 31.7 04/30/2021 0515   MCHC 34.7 04/30/2021 0515   RDW 11.9 04/30/2021 0515   RDW 12.6 11/25/2012 1712   LYMPHSABS 1.6 04/25/2021 2232   MONOABS 0.8 04/25/2021 2232   EOSABS 0.2 04/25/2021 2232   BASOSABS 0.1 04/25/2021 2232    CMP Latest Ref Rng & Units 04/30/2021 04/29/2021 04/28/2021  Glucose 70 - 99 mg/dL 207(H) 248(H) 206(H)  BUN 6 - 20 mg/dL 28(H) 27(H) 27(H)  Creatinine 0.61 - 1.24 mg/dL 0.84 0.81 0.75  Sodium 135 - 145 mmol/L 138 136 134(L)  Potassium 3.5 - 5.1 mmol/L 4.0 4.1 4.1  Chloride 98 - 111 mmol/L 106 106 104  CO2 22 - 32 mmol/L 26 24 21(L)  Calcium 8.9 - 10.3 mg/dL 8.2(L) 8.4(L) 8.2(L)  Total Protein 6.5 - 8.1 g/dL 5.5(L) 5.6(L) 5.9(L)  Total Bilirubin 0.3 - 1.2 mg/dL 0.8 0.8 1.2  Alkaline Phos 38 - 126 U/L 38 40 40  AST 15 - 41 U/L 12(L) 11(L) 15  ALT 0 - 44 U/L 15 15 16       Microbiology: Recent Results (from the past 240 hour(s))  Resp Panel by RT-PCR (Flu A&B, Covid) Nasopharyngeal Swab     Status: Abnormal   Collection Time: 04/26/21 12:29 AM   Specimen: Nasopharyngeal Swab; Nasopharyngeal(NP) swabs in vial transport medium  Result Value Ref Range Status   SARS Coronavirus 2 by RT PCR POSITIVE (A) NEGATIVE Final    Comment: (NOTE) SARS-CoV-2 target nucleic acids are DETECTED.  The SARS-CoV-2 RNA is generally detectable in upper respiratory specimens during the acute phase of infection. Positive results are indicative of the presence of the identified virus, but do not rule out bacterial infection or co-infection with other pathogens not detected by the test.  Clinical correlation with patient history and other diagnostic information is necessary to determine patient infection status. The expected result is Negative.  Fact Sheet for Patients: EntrepreneurPulse.com.au  Fact Sheet for Healthcare Providers: IncredibleEmployment.be  This test is not yet approved or cleared by the [place] FDA and  has been authorized for detection and/or diagnosis of SARS-CoV-2 by FDA under an Emergency Use Authorization (EUA).  This EUA will remain in effect (meaning this test can be used) for the duration of  the COVID-19 declaration under Section 564(b)(1) of the A ct, 21 U.S.C. section 360bbb-3(b)(1), unless the authorization is terminated or revoked sooner.     Influenza A by PCR NEGATIVE NEGATIVE Final   Influenza B by PCR NEGATIVE NEGATIVE Final    Comment: (NOTE) The Xpert Xpress SARS-CoV-2/FLU/RSV plus assay  is intended as an aid in the diagnosis of influenza from Nasopharyngeal swab specimens and should not be used as a sole basis for treatment. Nasal washings and aspirates are unacceptable for Xpert Xpress SARS-CoV-2/FLU/RSV testing.  Fact Sheet for Patients: EntrepreneurPulse.com.au  Fact Sheet for Healthcare Providers: IncredibleEmployment.be  This test is not yet approved or cleared by the [place] FDA and has been authorized for detection and/or diagnosis of SARS-CoV-2 by FDA under an Emergency Use Authorization (EUA). This EUA will remain in effect (meaning this test can be used) for the duration of the COVID-19 declaration under Section 564(b)(1) of the Act, 21 U.S.C. section 360bbb-3(b)(1), unless the authorization is terminated or revoked.  Performed at [facility], [address] [place], [place] [address]   Blood culture (routine x 2)     Status: Abnormal (Preliminary result)   Collection Time: 04/26/21 12:40 AM   Specimen: BLOOD   Result Value Ref Range Status   Specimen Description   Final    BLOOD LEFT ASSIST CONTROL Performed at [facility], [address] [place], [place] [address]    Special Requests   Final    BOTTLES DRAWN AEROBIC AND ANAEROBIC Blood Culture adequate volume Performed at [facility], [address] [place], [place] [address]    Culture  Setup Time   Final    Organism ID to follow AEROBIC BOTTLE ONLY GRAM POSITIVE COCCI CRITICAL RESULT CALLED TO, READ BACK BY AND VERIFIED WITH: Winfield Rast PATEL AT 2044 04/26/21 BY TKR.PMF ANAEROBIC BOTTLE ONLY CRITICAL RESULT CALLED TO, READ BACK BY AND VERIFIED WITH: KRISTEN MERRILL AT 1403 04/27/21.PMF GRAM POSITIVE RODS GRAM NEGATIVE RODS CRITICAL RESULT CALLED TO, READ BACK BY AND VERIFIED WITH: PHARM D S.WATSON ON [id number] AT 0848 BY E.PARRISH    Culture (A)  Final    STAPHYLOCOCCUS EPIDERMIDIS THE SIGNIFICANCE OF ISOLATING THIS ORGANISM FROM A SINGLE SET OF BLOOD CULTURES WHEN MULTIPLE SETS ARE DRAWN IS UNCERTAIN. PLEASE NOTIFY THE MICROBIOLOGY DEPARTMENT WITHIN ONE WEEK IF SPECIATION AND SENSITIVITIES ARE REQUIRED. GRAM NEGATIVE RODS CULTURE REINCUBATED FOR BETTER GROWTH Performed at [facility], [place] [address], [place], [place] [address]    Report Status PENDING  Incomplete  Blood culture (routine x 2)     Status: None   Collection Time: 04/26/21 12:40 AM   Specimen: BLOOD  Result Value Ref Range Status   Specimen Description BLOOD RIGHT ASSIST CONTROL  Final   Special Requests   Final    BOTTLES DRAWN AEROBIC AND ANAEROBIC Blood Culture results may not be optimal due to an excessive volume of blood received in culture bottles   Culture   Final    NO GROWTH 5 DAYS Performed at [facility], [address] [place],  [address]    Report Status 05/01/2021 FINAL  Final  Blood Culture ID Panel (Reflexed)     Status: Abnormal   Collection Time: 04/26/21 12:40 AM  Result Value Ref Range Status    Enterococcus faecalis NOT DETECTED NOT DETECTED Final   Enterococcus Faecium NOT DETECTED NOT DETECTED Final   Listeria monocytogenes NOT DETECTED NOT DETECTED Final   Staphylococcus species DETECTED (A) NOT DETECTED Final    Comment: CRITICAL RESULT CALLED TO, READ BACK BY AND VERIFIED WITH: KISHAN PATEL @ 2044 ON 04/26/2021.Marland KitchenMarland KitchenTKR    Staphylococcus aureus (BCID) NOT DETECTED NOT DETECTED Final   Staphylococcus epidermidis DETECTED (A) NOT DETECTED Final    Comment: Methicillin (oxacillin) resistant coagulase negative staphylococcus. Possible blood culture  contaminant (unless isolated from more than one blood culture draw or clinical case suggests pathogenicity). No antibiotic treatment is indicated for blood  culture contaminants. CRITICAL RESULT CALLED TO, READ BACK BY AND VERIFIED WITH: KISHAN PATEL @ 2044 ON 04/26/2021.Marland KitchenMarland KitchenTKR    Staphylococcus lugdunensis NOT DETECTED NOT DETECTED Final   Streptococcus species NOT DETECTED NOT DETECTED Final   Streptococcus agalactiae NOT DETECTED NOT DETECTED Final   Streptococcus pneumoniae NOT DETECTED NOT DETECTED Final   Streptococcus pyogenes NOT DETECTED NOT DETECTED Final   A.calcoaceticus-baumannii NOT DETECTED NOT DETECTED Final   Bacteroides fragilis NOT DETECTED NOT DETECTED Final   Enterobacterales NOT DETECTED NOT DETECTED Final   Enterobacter cloacae complex NOT DETECTED NOT DETECTED Final   Escherichia coli NOT DETECTED NOT DETECTED Final   Klebsiella aerogenes NOT DETECTED NOT DETECTED Final   Klebsiella oxytoca NOT DETECTED NOT DETECTED Final   Klebsiella pneumoniae NOT DETECTED NOT DETECTED Final   Proteus species NOT DETECTED NOT DETECTED Final   Salmonella species NOT DETECTED NOT DETECTED Final   Serratia marcescens NOT DETECTED NOT DETECTED Final   Haemophilus influenzae NOT DETECTED NOT DETECTED Final   Neisseria meningitidis NOT DETECTED NOT DETECTED Final   Pseudomonas aeruginosa NOT DETECTED NOT DETECTED Final    Stenotrophomonas maltophilia NOT DETECTED NOT DETECTED Final   Candida albicans NOT DETECTED NOT DETECTED Final   Candida auris NOT DETECTED NOT DETECTED Final   Candida glabrata NOT DETECTED NOT DETECTED Final   Candida krusei NOT DETECTED NOT DETECTED Final   Candida parapsilosis NOT DETECTED NOT DETECTED Final   Candida tropicalis NOT DETECTED NOT DETECTED Final   Cryptococcus neoformans/gattii NOT DETECTED NOT DETECTED Final   Methicillin resistance mecA/C DETECTED (A) NOT DETECTED Final    Comment: CRITICAL RESULT CALLED TO, READ BACK BY AND VERIFIED WITH: KISHAN PATEL @ 2044 ON 04/26/2021.Marland KitchenMarland KitchenTKR Performed at [facility] [address] [place], [place] [address]   Aerobic/Anaerobic Culture w Gram Stain (surgical/deep wound)     Status: None   Collection Time: 04/26/21  6:20 AM   Specimen: PATH Other; Abscess  Result Value Ref Range Status   Specimen Description   Final    ABSCESS Performed at [facility], [address] [place], [place] [address]    Special Requests   Final    NONE Performed at [facility], [place], [place] [address]    Gram Stain   Final    NO WBC SEEN FEW Lonell Grandchild NEGATIVE RODS FEW GRAM POSITIVE RODS RARE GRAM POSITIVE COCCI IN PAIRS Performed at [facility], [place] [address], [place], [place] [address]    Culture   Final    FEW ESCHERICHIA COLI FEW LACTOBACILLUS SPECIES Standardized susceptibility testing for this organism is not available. MIXED ANAEROBIC FLORA PRESENT.  CALL LAB IF FURTHER IID REQUIRED.    Report Status 04/29/2021 FINAL  Final   Organism ID, Bacteria ESCHERICHIA COLI  Final      Susceptibility   Escherichia coli - MIC*    AMPICILLIN <=2 SENSITIVE Sensitive     CEFAZOLIN <=4 SENSITIVE Sensitive     CEFEPIME <=0.12 SENSITIVE Sensitive     CEFTAZIDIME <=1 SENSITIVE Sensitive     CEFTRIAXONE <=0.25 SENSITIVE Sensitive     CIPROFLOXACIN >=4 RESISTANT Resistant     GENTAMICIN <=1  SENSITIVE Sensitive     IMIPENEM <=0.25 SENSITIVE Sensitive     TRIMETH/SULFA <=20 SENSITIVE Sensitive     AMPICILLIN/SULBACTAM <=2 SENSITIVE Sensitive  PIP/TAZO <=4 SENSITIVE Sensitive     * FEW ESCHERICHIA COLI  Blood Culture ID Panel (Reflexed)     Status: None   Collection Time: 04/27/21 12:40 AM  Result Value Ref Range Status   Enterococcus faecalis NOT DETECTED NOT DETECTED Final   Enterococcus Faecium NOT DETECTED NOT DETECTED Final   Listeria monocytogenes NOT DETECTED NOT DETECTED Final   Staphylococcus species NOT DETECTED NOT DETECTED Final   Staphylococcus aureus (BCID) NOT DETECTED NOT DETECTED Final   Staphylococcus epidermidis NOT DETECTED NOT DETECTED Final   Staphylococcus lugdunensis NOT DETECTED NOT DETECTED Final   Streptococcus species NOT DETECTED NOT DETECTED Final   Streptococcus agalactiae NOT DETECTED NOT DETECTED Final   Streptococcus pneumoniae NOT DETECTED NOT DETECTED Final   Streptococcus pyogenes NOT DETECTED NOT DETECTED Final   A.calcoaceticus-baumannii NOT DETECTED NOT DETECTED Final   Bacteroides fragilis NOT DETECTED NOT DETECTED Final   Enterobacterales NOT DETECTED NOT DETECTED Final   Enterobacter cloacae complex NOT DETECTED NOT DETECTED Final   Escherichia coli NOT DETECTED NOT DETECTED Final   Klebsiella aerogenes NOT DETECTED NOT DETECTED Final   Klebsiella oxytoca NOT DETECTED NOT DETECTED Final   Klebsiella pneumoniae NOT DETECTED NOT DETECTED Final   Proteus species NOT DETECTED NOT DETECTED Final   Salmonella species NOT DETECTED NOT DETECTED Final   Serratia marcescens NOT DETECTED NOT DETECTED Final   Haemophilus influenzae NOT DETECTED NOT DETECTED Final   Neisseria meningitidis NOT DETECTED NOT DETECTED Final   Pseudomonas aeruginosa NOT DETECTED NOT DETECTED Final   Stenotrophomonas maltophilia NOT DETECTED NOT DETECTED Final   Candida albicans NOT DETECTED NOT DETECTED Final   Candida auris NOT DETECTED NOT DETECTED Final    Candida glabrata NOT DETECTED NOT DETECTED Final   Candida krusei NOT DETECTED NOT DETECTED Final   Candida parapsilosis NOT DETECTED NOT DETECTED Final   Candida tropicalis NOT DETECTED NOT DETECTED Final   Cryptococcus neoformans/gattii NOT DETECTED NOT DETECTED Final    Comment: Performed at [facility], [address] [place], [place] [address]    IMAGING RESULTS: I have personally reviewed the films ? Impression/Recommendation ?PErforated diverticulitis s/p sigmoid colectomy and ileostomy d Purulent and feculent peritonitis- e.coli/lactobacillus in intraabdominal culture- on zosyn- day 7 of antibiotic will switch to unasyn- may be able to DC once NG tube is out and diet advanced No fever , leucocytosis has resolved  Gram neg rod in the blood 1 bottle cannot be identified has been sent out  Staph epidermidis in blood culture is contaminant  COVID positive - but asymptomatic- ct value 36 - so not an acute infection Steroids Dced  H/o colostomy and reversal in the past ? DM- on sliding scale HTN on losartan Smoker? ___________________________________________________ Discussed with patient,and care team Note:  This document was prepared using Dragon voice recognition software and may include unintentional dictation errors.

## 2021-05-02 NOTE — Progress Notes (Signed)
Patient ID: Timothy Cameron, male   DOB: 1966-02-21, 56 y.o.   MRN: [id number]     [place] [facility] Day(s): 6.   Interval History: Patient seen and examined, no acute events or new complaints overnight. Patient reports having more output through the ileostomy.  Continue passing gas.  Still feeling bloated.  Pain control.  Vital signs in last 24 hours: [min-max] current  Temp:  [97.8 F (36.6 C)-98.5 F (36.9 C)] 97.8 F (36.6 C) (01/11 0820) Pulse Rate:  [51-67] 58 (01/11 0820) Resp:  [18-20] 18 (01/11 0820) BP: (141-160)/(86-94) 160/94 (01/11 0820) SpO2:  [98 %-100 %] 100 % (01/11 0820)     Height: 5\' 11"  (180.3 cm) Weight: 99.8 kg BMI (Calculated): 30.7   Physical Exam:  Constitutional: alert, cooperative and no distress  Respiratory: breathing non-labored at rest  Cardiovascular: regular rate and sinus rhythm  Gastrointestinal: soft, non-tender, and mild-distended.  Ileostomy pink and patent.  Wound is dry and clean.  Labs:  CBC Latest Ref Rng & Units 04/30/2021 04/29/2021 04/28/2021  WBC 4.0 - 10.5 K/uL 6.9 10.7(H) 13.5(H)  Hemoglobin 13.0 - 17.0 g/dL 14.0 13.9 14.7  Hematocrit 39.0 - 52.0 % 40.4 40.1 41.8  Platelets 150 - 400 K/uL 186 171 142(L)   CMP Latest Ref Rng & Units 04/30/2021 04/29/2021 04/28/2021  Glucose 70 - 99 mg/dL 207(H) 248(H) 206(H)  BUN 6 - 20 mg/dL 28(H) 27(H) 27(H)  Creatinine 0.61 - 1.24 mg/dL 0.84 0.81 0.75  Sodium 135 - 145 mmol/L 138 136 134(L)  Potassium 3.5 - 5.1 mmol/L 4.0 4.1 4.1  Chloride 98 - 111 mmol/L 106 106 104  CO2 22 - 32 mmol/L 26 24 21(L)  Calcium 8.9 - 10.3 mg/dL 8.2(L) 8.4(L) 8.2(L)  Total Protein 6.5 - 8.1 g/dL 5.5(L) 5.6(L) 5.9(L)  Total Bilirubin 0.3 - 1.2 mg/dL 0.8 0.8 1.2  Alkaline Phos 38 - 126 U/L 38 40 40  AST 15 - 41 U/L 12(L) 11(L) 15  ALT 0 - 44 U/L 15 15 16     Imaging studies: No new pertinent imaging studies   Assessment/Plan:  56 y.o. male with perforated diverticulitis 6 Day Post-Op s/p partial  colectomy, appendectomy, laparotomy, complicated by pertinent comorbidities including uncontrolled diabetes, hypertension.  Ischemic colitis with perforation -S/p partial colectomy with anastomosis and diverting loop ileostomy -Continue with intermittent ileus but getting better.  Better ileostomy output and continue passing gas.  Continue with NGT clamped -If he continues tolerating full liquids with NGT clamped will consider removing NGT later today or tomorrow morning -Patient ambulating adequately  Uncontrolled hyperglycemia -Most likely due to steroid for treatment of COVID -Discussed with pulmonology for duration of steroid treatment -Appreciate hospitalist management to try to continue to control his hyperglycemia  COVID positive -No more coughing -Doing adequate incentive spirometer -Responding well to steroid therapy -Appreciate pulmonology management  Arnold Long, MD

## 2021-05-02 NOTE — Progress Notes (Signed)
Inpatient Diabetes Program Recommendations  AACE/ADA: New Consensus Statement on Inpatient Glycemic Control   Target Ranges:  Prepandial:   less than 140 mg/dL      Peak postprandial:   less than 180 mg/dL (1-2 hours)      Critically ill patients:  140 - 180 mg/dL    Latest Reference Range & Units 05/01/21 08:50 05/01/21 11:47 05/01/21 16:47 05/01/21 21:29 05/02/21 08:18 05/02/21 10:13  Glucose-Capillary 70 - 99 mg/dL 096 (H) 283 (H) 662 (H) 270 (H) 220 (H) 263 (H)   Review of Glycemic Control  Diabetes history: DM2 Outpatient Diabetes medications: Glipizide 10 mg daily Current orders for Inpatient glycemic control: Novolog 0-15 units TID with meals, Novolog 2 units TID with meals; Medrol dosepack (tapering)   Inpatient Diabetes Program Recommendations:     Insulin: If steroids are continued as ordered, please consider increasing meal coverage to Novolog 4 units TID with meals if patient eats at least 50% of meals.   Thanks, Orlando Penner, RN, MSN, CDE Diabetes Coordinator Inpatient Diabetes Program [phone] (Team Pager from 8am to 5pm)

## 2021-05-02 NOTE — Progress Notes (Signed)
Progress Note:    Timothy Cameron    PYP:[id number] DOB: 10-22-1965 DOA: 04/26/2021  PCP: Preston Fleeting, MD    Brief Narrative:   56 year old male with type 2 diabetes mellitus, hypertension, hyperlipidemia presented to the hospital with abdominal pain and found to have a perforated bowel.  Patient was brought to the operating room by Dr. Hazle Quant on 04/26/2020.    Subjective:   NG tube still in place but clamped currently.  Tolerating full liquid diet.  He is on room air and saturating well.  Ambulating with PT.  He is passing gas.    Assessment and Plan:   COVID-19 pneumonia/infection: With atelectasis and mucous plug in the left lung and groundglass opacities/pneumonia in the right lung as per CT scan.  Completed course of remdesivir.  Not requiring any oxygen.  Will discontinue Decadron as risks outweigh benefits.  Mucous plugging resolved with supportive care without bronchoscopy.  Continue ICS.  Acute peritonitis: Secondary to perforated viscus.  Fluid cultures growing E. coli.  Currently on Zosyn.  Infectious disease has de-escalated to Unasyn.  Bowel perforation status post ex lap with sigmoid colectomy with anastomosis, appendectomy and incisional hernia repair: Management per surgery.  NG tube currently clamped.  Type 2 diabetes mellitus: Continue SSI.  And scheduled 2 units with meals.  Well-controlled with last A1c 7.1  Dyslipidemia: Continue atorvastatin  Essential hypertension: Continue home losartan   Other information:    DVT prophylaxis: Lovenox subcu Code Status: Full code Family Communication: None present at bedside Disposition:   Status is: Inpatient  Remains inpatient appropriate because: Still has NG tube in place           Consultants:   PCCM, general surgery, ID    Objective:    Vitals:   05/01/21 1708 05/01/21 1957 05/02/21 0345 05/02/21 0820  BP: (!) 155/90 (!) 141/86 (!) 147/94 (!) 160/94  Pulse: (!)  59 67 (!) 51 (!) 58  Resp: 18 20 20 18   Temp: 98.3 F (36.8 C) 98.5 F (36.9 C) 98 F (36.7 C) 97.8 F (36.6 C)  TempSrc:      SpO2: 98% 100% 98% 100%  Weight:      Height:        Intake/Output Summary (Last 24 hours) at 05/02/2021 1541 Last data filed at 05/02/2021 1500 Gross per 24 hour  Intake 100 ml  Output 600 ml  Net -500 ml   Filed Weights   04/25/21 2222  Weight: 99.8 kg       Physical Exam:    General exam: Appears calm and comfortable, NG tube in place currently clamped Respiratory system: Clear to auscultation. Respiratory effort normal. Cardiovascular system: S1 & S2 heard, RRR. No JVD, murmurs, rubs, gallops or clicks. No pedal edema. Gastrointestinal system: Abdomen is nondistended, soft and nontender. No organomegaly or masses felt. Normal bowel sounds heard.  Ostomy appliance in place with staples clean dry and intact.  Incision site site looks clean and dry with no signs of infection.  JP drain in place. Central nervous system: Alert and oriented. No focal neurological deficits. Extremities: Symmetric 5 x 5 power. Skin: No rashes, lesions or ulcers Psychiatry: Judgement and insight appear normal. Mood & affect appropriate.     Data Reviewed:    I have personally reviewed following labs and imaging studies  CBC: Recent Labs  Lab 04/25/21 2232 04/27/21 0500 04/28/21 0604 04/29/21 0504 04/30/21 0515  WBC 18.5* 14.5* 13.5* 10.7* 6.9  NEUTROABS 15.8*  --   --   --   --  HGB 18.6* 16.1 14.7 13.9 14.0  HCT 52.2* 47.1 41.8 40.1 40.4  MCV 91.3 92.5 93.5 92.2 91.6  PLT 216 139* 142* 171 186    Basic Metabolic Panel: Recent Labs  Lab 04/25/21 2232 04/27/21 0500 04/28/21 0604 04/29/21 0504 04/30/21 0515  NA 133* 136 134* 136 138  K 3.8 3.7 4.1 4.1 4.0  CL 100 106 104 106 106  CO2 21* 24 21* 24 26  GLUCOSE 280* 187* 206* 248* 207*  BUN 16 18 27* 27* 28*  CREATININE 1.28* 0.80 0.75 0.81 0.84  CALCIUM 9.0 8.0* 8.2* 8.4* 8.2*  MG  --  2.0  2.1 2.1 1.9  PHOS  --  2.5 3.4 2.7 4.3    GFR: Estimated Creatinine Clearance: 119.6 mL/min (by C-G formula based on SCr of 0.84 mg/dL).  Liver Function Tests: Recent Labs  Lab 04/25/21 2232 04/28/21 0604 04/29/21 0504 04/30/21 0515  AST 32 15 11* 12*  ALT 28 16 15 15   ALKPHOS 51 40 40 38  BILITOT 1.6* 1.2 0.8 0.8  PROT 7.4 5.9* 5.6* 5.5*  ALBUMIN 4.3 2.8* 2.5* 2.5*    CBG: Recent Labs  Lab 05/01/21 1647 05/01/21 2129 05/02/21 0818 05/02/21 1013 05/02/21 1227  GLUCAP 287* 270* 220* 263* 234*     Recent Results (from the past 240 hour(s))  Resp Panel by RT-PCR (Flu A&B, Covid) Nasopharyngeal Swab     Status: Abnormal   Collection Time: 04/26/21 12:29 AM   Specimen: Nasopharyngeal Swab; Nasopharyngeal(NP) swabs in vial transport medium  Result Value Ref Range Status   SARS Coronavirus 2 by RT PCR POSITIVE (A) NEGATIVE Final    Comment: (NOTE) SARS-CoV-2 target nucleic acids are DETECTED.  The SARS-CoV-2 RNA is generally detectable in upper respiratory specimens during the acute phase of infection. Positive results are indicative of the presence of the identified virus, but do not rule out bacterial infection or co-infection with other pathogens not detected by the test. Clinical correlation with patient history and other diagnostic information is necessary to determine patient infection status. The expected result is Negative.  Fact Sheet for Patients: BloggerCourse.comhttps://www.fda.gov/media/152166/download  Fact Sheet for Healthcare Providers: SeriousBroker.ithttps://www.fda.gov/media/152162/download  This test is not yet approved or cleared by the [place] FDA and  has been authorized for detection and/or diagnosis of SARS-CoV-2 by FDA under an Emergency Use Authorization (EUA).  This EUA will remain in effect (meaning this test can be used) for the duration of  the COVID-19 declaration under Section 564(b)(1) of the A ct, 21 U.S.C. section 360bbb-3(b)(1), unless the  authorization is terminated or revoked sooner.     Influenza A by PCR NEGATIVE NEGATIVE Final   Influenza B by PCR NEGATIVE NEGATIVE Final    Comment: (NOTE) The Xpert Xpress SARS-CoV-2/FLU/RSV plus assay is intended as an aid in the diagnosis of influenza from Nasopharyngeal swab specimens and should not be used as a sole basis for treatment. Nasal washings and aspirates are unacceptable for Xpert Xpress SARS-CoV-2/FLU/RSV testing.  Fact Sheet for Patients: BloggerCourse.comhttps://www.fda.gov/media/152166/download  Fact Sheet for Healthcare Providers: SeriousBroker.ithttps://www.fda.gov/media/152162/download  This test is not yet approved or cleared by the [place] FDA and has been authorized for detection and/or diagnosis of SARS-CoV-2 by FDA under an Emergency Use Authorization (EUA). This EUA will remain in effect (meaning this test can be used) for the duration of the COVID-19 declaration under Section 564(b)(1) of the Act, 21 U.S.C. section 360bbb-3(b)(1), unless the authorization is terminated or revoked.  Performed at [facility], [address] [place], [place] [address]   Blood culture (routine x 2)     Status: Abnormal (Preliminary result)   Collection Time: 04/26/21 12:40 AM   Specimen: BLOOD  Result Value Ref Range Status   Specimen Description   Final    BLOOD LEFT ASSIST CONTROL Performed at [facility], [address], [place], [place] [address]    Special Requests   Final    BOTTLES DRAWN AEROBIC AND ANAEROBIC Blood Culture adequate volume Performed at [facility], [address] [place], [place] [address]    Culture  Setup Time   Final    Organism ID to follow AEROBIC BOTTLE ONLY GRAM POSITIVE COCCI CRITICAL RESULT CALLED TO, READ BACK BY AND VERIFIED WITH: Kevin Fenton PATEL AT 2044 04/26/21 BY TKR.PMF ANAEROBIC BOTTLE ONLY CRITICAL RESULT CALLED TO, READ BACK BY AND VERIFIED WITH: KRISTEN MERRILL AT 1403 04/27/21.PMF GRAM POSITIVE RODS GRAM NEGATIVE  RODS CRITICAL RESULT CALLED TO, READ BACK BY AND VERIFIED WITH: PHARM D S.WATSON ON [id number] AT 0848 BY E.PARRISH    Culture (A)  Final    STAPHYLOCOCCUS EPIDERMIDIS THE SIGNIFICANCE OF ISOLATING THIS ORGANISM FROM A SINGLE SET OF BLOOD CULTURES WHEN MULTIPLE SETS ARE DRAWN IS UNCERTAIN. PLEASE NOTIFY THE MICROBIOLOGY DEPARTMENT WITHIN ONE WEEK IF SPECIATION AND SENSITIVITIES ARE REQUIRED. GRAM NEGATIVE RODS CULTURE REINCUBATED FOR BETTER GROWTH Performed at [facility], [address] [place], [place] [address]    Report Status PENDING  Incomplete  Blood culture (routine x 2)     Status: None   Collection Time: 04/26/21 12:40 AM   Specimen: BLOOD  Result Value Ref Range Status   Specimen Description BLOOD RIGHT ASSIST CONTROL  Final   Special Requests   Final    BOTTLES DRAWN AEROBIC AND ANAEROBIC Blood Culture results may not be optimal due to an excessive volume of blood received in culture bottles   Culture   Final    NO GROWTH 5 DAYS Performed at [facility], [address] [place], [place] [address]    Report Status 05/01/2021 FINAL  Final  Blood Culture ID Panel (Reflexed)     Status: Abnormal   Collection Time: 04/26/21 12:40 AM  Result Value Ref Range Status   Enterococcus faecalis NOT DETECTED NOT DETECTED Final   Enterococcus Faecium NOT DETECTED NOT DETECTED Final   Listeria monocytogenes NOT DETECTED NOT DETECTED Final   Staphylococcus species DETECTED (A) NOT DETECTED Final    Comment: CRITICAL RESULT CALLED TO, READ BACK BY AND VERIFIED WITH: KISHAN PATEL @ 2044 ON 04/26/2021.Marland KitchenMarland KitchenTKR    Staphylococcus aureus (BCID) NOT DETECTED NOT DETECTED Final   Staphylococcus epidermidis DETECTED (A) NOT DETECTED Final    Comment: Methicillin (oxacillin) resistant coagulase negative staphylococcus. Possible blood culture contaminant (unless isolated from more than one blood culture draw or clinical case suggests pathogenicity). No antibiotic treatment is indicated  for blood  culture contaminants. CRITICAL RESULT CALLED TO, READ BACK BY AND VERIFIED WITH: KISHAN PATEL @ 2044 ON 04/26/2021.Marland KitchenMarland KitchenTKR    Staphylococcus lugdunensis NOT DETECTED NOT DETECTED Final   Streptococcus species NOT DETECTED NOT DETECTED Final   Streptococcus agalactiae NOT DETECTED NOT DETECTED Final   Streptococcus pneumoniae NOT DETECTED NOT DETECTED Final   Streptococcus pyogenes NOT DETECTED NOT DETECTED Final   A.calcoaceticus-baumannii NOT DETECTED NOT DETECTED Final   Bacteroides fragilis NOT DETECTED NOT DETECTED Final   Enterobacterales NOT DETECTED NOT DETECTED Final   Enterobacter cloacae complex NOT DETECTED NOT DETECTED Final   Escherichia  coli NOT DETECTED NOT DETECTED Final   Klebsiella aerogenes NOT DETECTED NOT DETECTED Final   Klebsiella oxytoca NOT DETECTED NOT DETECTED Final   Klebsiella pneumoniae NOT DETECTED NOT DETECTED Final   Proteus species NOT DETECTED NOT DETECTED Final   Salmonella species NOT DETECTED NOT DETECTED Final   Serratia marcescens NOT DETECTED NOT DETECTED Final   Haemophilus influenzae NOT DETECTED NOT DETECTED Final   Neisseria meningitidis NOT DETECTED NOT DETECTED Final   Pseudomonas aeruginosa NOT DETECTED NOT DETECTED Final   Stenotrophomonas maltophilia NOT DETECTED NOT DETECTED Final   Candida albicans NOT DETECTED NOT DETECTED Final   Candida auris NOT DETECTED NOT DETECTED Final   Candida glabrata NOT DETECTED NOT DETECTED Final   Candida krusei NOT DETECTED NOT DETECTED Final   Candida parapsilosis NOT DETECTED NOT DETECTED Final   Candida tropicalis NOT DETECTED NOT DETECTED Final   Cryptococcus neoformans/gattii NOT DETECTED NOT DETECTED Final   Methicillin resistance mecA/C DETECTED (A) NOT DETECTED Final    Comment: CRITICAL RESULT CALLED TO, READ BACK BY AND VERIFIED WITH: KISHAN PATEL @ 2044 ON 04/26/2021.Marland Kitchen.Marland Kitchen.TKR Performed at [facility], [address] [place], [place] [address]   Aerobic/Anaerobic Culture w  Gram Stain (surgical/deep wound)     Status: None   Collection Time: 04/26/21  6:20 AM   Specimen: PATH Other; Abscess  Result Value Ref Range Status   Specimen Description   Final    ABSCESS Performed at [facility], [address] [place], [place] [address]    Special Requests   Final    NONE Performed at [facility], [address] [place], [place] [address]    Gram Stain   Final    NO WBC SEEN FEW Romie MinusGRAM NEGATIVE RODS FEW GRAM POSITIVE RODS RARE GRAM POSITIVE COCCI IN PAIRS Performed at [facility], [address] [place], [place] [address]    Culture   Final    FEW ESCHERICHIA COLI FEW LACTOBACILLUS SPECIES Standardized susceptibility testing for this organism is not available. MIXED ANAEROBIC FLORA PRESENT.  CALL LAB IF FURTHER IID REQUIRED.    Report Status 04/29/2021 FINAL  Final   Organism ID, Bacteria ESCHERICHIA COLI  Final      Susceptibility   Escherichia coli - MIC*    AMPICILLIN <=2 SENSITIVE Sensitive     CEFAZOLIN <=4 SENSITIVE Sensitive     CEFEPIME <=0.12 SENSITIVE Sensitive     CEFTAZIDIME <=1 SENSITIVE Sensitive     CEFTRIAXONE <=0.25 SENSITIVE Sensitive     CIPROFLOXACIN >=4 RESISTANT Resistant     GENTAMICIN <=1 SENSITIVE Sensitive     IMIPENEM <=0.25 SENSITIVE Sensitive     TRIMETH/SULFA <=20 SENSITIVE Sensitive     AMPICILLIN/SULBACTAM <=2 SENSITIVE Sensitive     PIP/TAZO <=4 SENSITIVE Sensitive     * FEW ESCHERICHIA COLI  Blood Culture ID Panel (Reflexed)     Status: None   Collection Time: 04/27/21 12:40 AM  Result Value Ref Range Status   Enterococcus faecalis NOT DETECTED NOT DETECTED Final   Enterococcus Faecium NOT DETECTED NOT DETECTED Final   Listeria monocytogenes NOT DETECTED NOT DETECTED Final   Staphylococcus species NOT DETECTED NOT DETECTED Final   Staphylococcus aureus (BCID) NOT DETECTED NOT DETECTED Final   Staphylococcus epidermidis NOT DETECTED NOT DETECTED Final   Staphylococcus lugdunensis  NOT DETECTED NOT DETECTED Final   Streptococcus species NOT DETECTED NOT DETECTED Final   Streptococcus agalactiae NOT DETECTED NOT DETECTED Final   Streptococcus pneumoniae NOT DETECTED  NOT DETECTED Final   Streptococcus pyogenes NOT DETECTED NOT DETECTED Final   A.calcoaceticus-baumannii NOT DETECTED NOT DETECTED Final   Bacteroides fragilis NOT DETECTED NOT DETECTED Final   Enterobacterales NOT DETECTED NOT DETECTED Final   Enterobacter cloacae complex NOT DETECTED NOT DETECTED Final   Escherichia coli NOT DETECTED NOT DETECTED Final   Klebsiella aerogenes NOT DETECTED NOT DETECTED Final   Klebsiella oxytoca NOT DETECTED NOT DETECTED Final   Klebsiella pneumoniae NOT DETECTED NOT DETECTED Final   Proteus species NOT DETECTED NOT DETECTED Final   Salmonella species NOT DETECTED NOT DETECTED Final   Serratia marcescens NOT DETECTED NOT DETECTED Final   Haemophilus influenzae NOT DETECTED NOT DETECTED Final   Neisseria meningitidis NOT DETECTED NOT DETECTED Final   Pseudomonas aeruginosa NOT DETECTED NOT DETECTED Final   Stenotrophomonas maltophilia NOT DETECTED NOT DETECTED Final   Candida albicans NOT DETECTED NOT DETECTED Final   Candida auris NOT DETECTED NOT DETECTED Final   Candida glabrata NOT DETECTED NOT DETECTED Final   Candida krusei NOT DETECTED NOT DETECTED Final   Candida parapsilosis NOT DETECTED NOT DETECTED Final   Candida tropicalis NOT DETECTED NOT DETECTED Final   Cryptococcus neoformans/gattii NOT DETECTED NOT DETECTED Final    Comment: Performed at [facility], [address] [place], [place] [address]         Radiology Studies:    No results found.      Medications:    Scheduled Meds:  albuterol  2 puff Inhalation Q6H   atorvastatin  20 mg Oral QHS   enoxaparin (LOVENOX) injection  0.5 mg/kg Subcutaneous Q24H   gabapentin  300 mg Oral BID   guaiFENesin  600 mg Oral BID   insulin aspart  0-15 Units Subcutaneous TID WC   insulin aspart   2 Units Subcutaneous TID WC   ipratropium  2 puff Inhalation Q6H   losartan  25 mg Oral Daily   pantoprazole  40 mg Oral QHS   Continuous Infusions:  ampicillin-sulbactam (UNASYN) IV         LOS: 6 days    Time spent: 35 minutes    Verdia Kuba, MD [facility]   To contact the attending provider between 7A-7P or the covering provider during after hours 7P-7A, please log into the web site [email or web address] and access using universal [place] password for that web site. If you do not have the password, please call the hospital operator.  05/02/2021, 3:41 PM

## 2021-05-02 NOTE — Progress Notes (Signed)
Physical Therapy Treatment Patient Details Name: Timothy Cameron MRN: [id number] DOB: February 09, 1966 Today's Date: 05/02/2021   History of Present Illness Patient is a 56 year old male who presented with abdominal pain. Found to have acute perforation of the intestine with emergent exploratory laparotomy performed with sigmoid colectomy and ileostomy performed. COVID positive as incidental finding.    PT Comments    Pt is making good progress towards goals and has been ambulatory in hallway. Able to progress to ambulation without AD and perform stair training this date. Safe technique. Does not require follow up therapy at this time and will dc in house. Please re-order if needs change.   Recommendations for follow up therapy are one component of a multi-disciplinary discharge planning process, led by the attending physician.  Recommendations may be updated based on patient status, additional functional criteria and insurance authorization.  Follow Up Recommendations  No PT follow up     Assistance Recommended at Discharge PRN  Patient can return home with the following     Equipment Recommendations  None recommended by PT    Recommendations for Other Services       Precautions / Restrictions Precautions Precautions: None Restrictions Weight Bearing Restrictions: No     Mobility  Bed Mobility               General bed mobility comments: NT pt received in recliner    Transfers Overall transfer level: Independent Equipment used: None Transfers: Sit to/from Stand Sit to Stand: Independent           General transfer comment: safe technique with upright posture    Ambulation/Gait Ambulation/Gait assistance: Independent Gait Distance (Feet): 1000 Feet Assistive device: None Gait Pattern/deviations: Step-through pattern       General Gait Details: ambulated multiple laps in hallway initially with IV pole, however then able to transition to no AD. Reciprocal  gait pattern and good cadence noted. Able to carry conversation throughout exertion with no SOB symptoms. NG clamped at this time.   Stairs Stairs: Yes Stairs assistance: Modified independent (Device/Increase time) Stair Management: One rail Right;Step to pattern Number of Stairs: 15 General stair comments: performed 5 x 3 steps with step to gait pattern. Using railing, becomes slightly SOB with exertion.   Wheelchair Mobility    Modified Rankin (Stroke Patients Only)       Balance Overall balance assessment: Independent                                          Cognition Arousal/Alertness: Awake/alert Behavior During Therapy: WFL for tasks assessed/performed Overall Cognitive Status: Within Functional Limits for tasks assessed                                 General Comments: patient is alert and oriented, able to follow all directions without difficulty        Exercises      General Comments        Pertinent Vitals/Pain Pain Assessment: No/denies pain    Home Living                          Prior Function            PT Goals (current goals can now be found in the care plan  section) Acute Rehab PT Goals Patient Stated Goal: to return home after bowel function better PT Goal Formulation: With patient Time For Goal Achievement: 05/02/21 Potential to Achieve Goals: Good Progress towards PT goals: Progressing toward goals    Frequency    Min 2X/week      PT Plan Frequency needs to be updated    Co-evaluation              AM-PAC PT "6 Clicks" Mobility   Outcome Measure  Help needed turning from your back to your side while in a flat bed without using bedrails?: None Help needed moving from lying on your back to sitting on the side of a flat bed without using bedrails?: None Help needed moving to and from a bed to a chair (including a wheelchair)?: None Help needed standing up from a chair using your  arms (e.g., wheelchair or bedside chair)?: None Help needed to walk in hospital room?: None Help needed climbing 3-5 steps with a railing? : None 6 Click Score: 24    End of Session   Activity Tolerance: Patient tolerated treatment well Patient left:  (left standing in room) Nurse Communication: Mobility status PT Visit Diagnosis: Pain;Other abnormalities of gait and mobility (R26.89) Pain - Right/Left:  (center) Pain - part of body:  (abdomen)     Time: 5638-9373 PT Time Calculation (min) (ACUTE ONLY): 23 min  Charges:  $Gait Training: 23-37 mins                     Elizabeth Palau, PT, DPT, GCS [phone]    [place] Droll 05/02/2021, 3:44 PM

## 2021-05-03 LAB — BASIC METABOLIC PANEL
Anion gap: 7 (ref 5–15)
BUN: 12 mg/dL (ref 6–20)
CO2: 29 mmol/L (ref 22–32)
Calcium: 8.8 mg/dL — ABNORMAL LOW (ref 8.9–10.3)
Chloride: 103 mmol/L (ref 98–111)
Creatinine, Ser: 0.8 mg/dL (ref 0.61–1.24)
GFR, Estimated: >60 mL/min (ref >60)
Glucose, Bld: 193 mg/dL — ABNORMAL HIGH (ref 70–99)
Potassium: 3.7 mmol/L (ref 3.5–5.1)
Sodium: 139 mmol/L (ref 135–145)

## 2021-05-03 LAB — CBC
HCT: 41.6 % (ref 39.0–52.0)
Hemoglobin: 14.6 g/dL (ref 13.0–17.0)
MCH: 31.4 pg (ref 26.0–34.0)
MCHC: 35.1 g/dL (ref 30.0–36.0)
MCV: 89.5 fL (ref 80.0–100.0)
Platelets: 258 10*3/uL (ref 150–400)
RBC: 4.65 MIL/uL (ref 4.22–5.81)
RDW: 11.9 % (ref 11.5–15.5)
WBC: 9.4 10*3/uL (ref 4.0–10.5)
nRBC: 0 % (ref 0.0–0.2)

## 2021-05-03 LAB — GLUCOSE, CAPILLARY
Glucose-Capillary: 189 mg/dL — ABNORMAL HIGH (ref 70–99)
Glucose-Capillary: 267 mg/dL — ABNORMAL HIGH (ref 70–99)

## 2021-05-03 MED ORDER — HYDROCODONE-ACETAMINOPHEN 5-325 MG PO TABS: 1.0000 | ORAL_TABLET | ORAL | 0 refills | Status: AC | PRN

## 2021-05-03 MED ORDER — AMOXICILLIN-POT CLAVULANATE 875-125 MG PO TABS: 1.0000 | ORAL_TABLET | Freq: Two times a day (BID) | ORAL | 0 refills | Status: AC

## 2021-05-03 NOTE — Consult Note (Signed)
[place] Nurse ostomy follow up Stoma type/location: RLQ ileostomy  Patient anticipating discharge soon.  Going home to the home of friend. She is a former [employer] scientist and feels comfortable with ostomy care.  She could not come today.  Patient has had an ostomy in the past. HE has emptied this pouch many times but not performed pouch change independently.  Today, he will.  Stomal assessment/size: Oval stoma 3.5 cm x 2 cm .  We demonstrate measuring and he understands that stoma shape and size may change with rod removal and over the next 6-8 weeks.   Peristomal assessment: intact   Treatment options for stomal/peristomal skin: barrier ring and 2 piece pouch Output soft brown stool Ostomy pouching: 2pc. 2 3/4 pouch with barrier ring  Education provided: Stoma measured and cut to fit (by patient)  He assembles and applied barrier ring and pouch.  He rolls closed. We discuss twice weekly pouch changes and supplies being mailed to him once he is set up with supply company.  I ask if he would like secure start to be set up with his friend's address and he prefers it stay with his home address.  He has someone who will pick up packages for him. I provide information about the outpatient ostomy clinic and inform him to make appointment if needed after discharge (this will require a MD order)  Enrolled patient in [place] Start Discharge program: Yes Will follow through discharge.  Domenic Moras MSN, RN, FNP-BC [facility]ON Wound, Ostomy, Continence Nurse Pager [phone]

## 2021-05-03 NOTE — Discharge Summary (Signed)
°  Patient ID: Timothy Cameron MRN: [id number] DOB/AGE: [birth year] 56 y.o.  Admit date: 04/26/2021 Discharge date: 05/03/2021   Discharge Diagnoses:  Principal Problem:   Bowel perforation (HCC) Active Problems:   Type 2 diabetes mellitus with hyperlipidemia (HCC)   HTN (hypertension)   HLD (hyperlipidemia)   Diverticulitis of large intestine with perforation without bleeding   Atelectasis of left lung   Pneumonia due to COVID-19 virus   Bacteremia due to Gram-negative bacteria   Procedures: Partial colectomy with anastomosis and loop ileostomy creation Appendectomy Incisional hernia repair  Hospital Course: Patient with perforated sigmoid colon.  He underwent partial colectomy with anastomosis and creation of loop ileostomy.  He has been recovering slowly but adequately.  He has been able to ambulate.  He is tolerating soft diet.  Ileostomy is working adequately.  He was treated with IV antibiotic therapy for 7 days.  Recommendation was to transition to oral antibiotic therapy.  Patient also diagnosed with COVID-19.  He was treated with steroid therapy.  He responded well as well.  Physical Exam Constitutional:      Appearance: He is well-developed.  HENT:     Head: Normocephalic.  Cardiovascular:     Rate and Rhythm: Normal rate and regular rhythm.  Pulmonary:     Effort: Pulmonary effort is normal.     Breath sounds: Normal breath sounds.  Abdominal:     General: Abdomen is flat. Bowel sounds are normal.     Palpations: Abdomen is soft.  Skin:    General: Skin is warm.  Neurological:     General: No focal deficit present.     Mental Status: He is alert and oriented to person, place, and time.  Right lower quadrant ileostomy pink and patent.   Consults: Hospitalist  Disposition: Discharge disposition: 01-Home or Self Care       Discharge Instructions     Diet - low sodium heart healthy   Complete by: As directed    Increase activity slowly   Complete by: As  directed       Allergies as of 05/03/2021   No Known Allergies      Medication List     TAKE these medications    amoxicillin-clavulanate 875-125 MG tablet Commonly known as: Augmentin Take 1 tablet by mouth 2 (two) times daily for 6 days.   atorvastatin 20 MG tablet Commonly known as: LIPITOR Take 20 mg by mouth at bedtime.   glipiZIDE 10 MG 24 hr tablet Commonly known as: GLUCOTROL XL Take 1 tablet (10 mg total) by mouth at bedtime.   hydrochlorothiazide 25 MG tablet Commonly known as: HYDRODIURIL Take 25 mg by mouth daily.   HYDROcodone-acetaminophen 5-325 MG tablet Commonly known as: Norco Take 1 tablet by mouth every 4 (four) hours as needed for up to 3 days for moderate pain.   losartan 25 MG tablet Commonly known as: COZAAR Take 1 tablet (25 mg total) by mouth daily. What changed: how much to take   sertraline 25 MG tablet Commonly known as: ZOLOFT Take 25 mg by mouth daily.        Follow-up Information     Carolan Shiver, MD Follow up on 05/10/2021.   Specialty: General Surgery Why: Follow up colon resection. For suture removal Contact information: [address] [place] [address] [phone]

## 2021-05-03 NOTE — Progress Notes (Signed)
PULMONOLOGY         Date: 05/03/2021,   MRN# [id number] Timothy Cameron [facility] [birth year]     AdmissionWeight: 99.8 kg                 CurrentWeight: 99.8 kg   Referring physician: Dr Dwyane Dee   CHIEF COMPLAINT:   Left lung complete atelectasis with mucus plugging.    HISTORY OF PRESENT ILLNESS   This is a pleasant male with hx of DM , dyslipidemia, HTN, admitted with abd pain evening of 04/26/21.  He was noted to have signs and symptoms of sepsis with circulatory shock responsive to IVF.  He had CT imaging abdomen which revealed perforated viscus. He was seen by surgery and is s/p operative intervention with ex lap and sigmoid colectomy and appendectomy.   04/28/21- patient improved clinically. Continue current care plan. No need for bronchoscopy at this time. Left sided infiltrate/atelectasis as below.     Narrative & Impression  CLINICAL DATA:  COVID positive patient. Follow-up left atelectasis. Clinical symptoms improving.   EXAM: PORTABLE CHEST 1 VIEW   COMPARISON:  04/27/2021 and older exams.   FINDINGS: There has been a significant interval improvement in lung aeration, with most of the previously seen left mid to lower lung opacity resolving. There are subtle residual areas of opacity bilaterally. No new lung abnormalities.   No convincing pleural effusion.  No pneumothorax.   Cardiac silhouette is normal in size. Normal mediastinal and hilar contours.   Nasal/orogastric tube is stable, tip in the mid stomach.   IMPRESSION: 1. Significant interval improvement with near complete resolution of the left mid to lower lung opacities.     Electronically Signed   By: Lajean Manes M.D.   On: 04/28/2021 10:12      04/29/21- patient doing well on room air. Continue bronchopulmonary hygiene and PT.     05/03/21- patient is being discharged today. We discussed outpatient follow up for left lung collapse.  I have provided patient with contact information and  pulmonary dc instructions to continue IS at home. He is on room air ambulatory.   PAST MEDICAL HISTORY   Past Medical History:  Diagnosis Date   Diabetes ([place])    High cholesterol    Hypertension      SURGICAL HISTORY   Past Surgical History:  Procedure Laterality Date   ABDOMINAL SURGERY     APPENDECTOMY  04/26/2021   Procedure: APPENDECTOMY;  Surgeon: Herbert Pun, MD;  Location: [facility] ORS;  Service: General;;   COLON RESECTION SIGMOID  04/26/2021   Procedure: partial colectomy with anastomosis;  Surgeon: Herbert Pun, MD;  Location: [facility] ORS;  Service: General;;   COLONOSCOPY N/A 03/09/2016   Procedure: COLONOSCOPY;  Surgeon: Wilford Corner, MD;  Location: [facility];  Service: Endoscopy;  Laterality: N/A;   ILEOSTOMY  04/26/2021   Procedure: ILEOSTOMY Creation;  Surgeon: Herbert Pun, MD;  Location: [facility] ORS;  Service: General;;   INCISIONAL HERNIA REPAIR  04/26/2021   Procedure: HERNIA REPAIR INCISIONAL;  Surgeon: Herbert Pun, MD;  Location: [facility] ORS;  Service: General;;     FAMILY HISTORY   History reviewed. No pertinent family history.   SOCIAL HISTORY   Social History   Tobacco Use   Smoking status: Every Day    Types: Cigars    Last attempt to quit: 10/23/2017    Years since quitting: 3.5   Smokeless tobacco: Never  Vaping Use   Vaping Use: Never used  Substance Use  Topics   Alcohol use: Yes    Alcohol/week: 30.0 standard drinks    Types: 30 Cans of beer per week   Drug use: Yes    Types: Cocaine, Marijuana     MEDICATIONS    Home Medication:    Current Medication:  Current Facility-Administered Medications:    albuterol (VENTOLIN HFA) 108 (90 Base) MCG/ACT inhaler 2 puff, 2 puff, Inhalation, Q6H, Gillis Santa, MD, 2 puff at 05/03/21 0908   Ampicillin-Sulbactam (UNASYN) 3 g in sodium chloride 0.9 % 100 mL IVPB, 3 g, Intravenous, Q6H, Ravishankar, Jayashree, MD, Last Rate: 200 mL/hr at 05/03/21 1153, 3 g at  05/03/21 1153   atorvastatin (LIPITOR) tablet 20 mg, 20 mg, Oral, QHS, Lucianne Muss, Dileep, MD, 20 mg at 05/02/21 2143   enoxaparin (LOVENOX) injection 50 mg, 0.5 mg/kg, Subcutaneous, Q24H, Cintron-Diaz, Edgardo, MD, 50 mg at 05/03/21 1007   gabapentin (NEURONTIN) capsule 300 mg, 300 mg, Oral, BID, Cintron-Diaz, Edgardo, MD, 300 mg at 05/03/21 0907   guaiFENesin (MUCINEX) 12 hr tablet 600 mg, 600 mg, Oral, BID, Gillis Santa, MD, 600 mg at 05/03/21 1219   hydrALAZINE (APRESOLINE) injection 5 mg, 5 mg, Intravenous, Q4H PRN, Carolan Shiver, MD   HYDROcodone-acetaminophen (NORCO/VICODIN) 5-325 MG per tablet 1-2 tablet, 1-2 tablet, Oral, Q4H PRN, Carolan Shiver, MD, 1 tablet at 05/01/21 1654   HYDROmorphone (DILAUDID) injection 1 mg, 1 mg, Intravenous, Q4H PRN, Piscoya, Jose, MD, 1 mg at 05/03/21 0415   insulin aspart (novoLOG) injection 0-15 Units, 0-15 Units, Subcutaneous, TID WC, Carolan Shiver, MD, 8 Units at 05/03/21 1153   insulin aspart (novoLOG) injection 2 Units, 2 Units, Subcutaneous, TID WC, Alford Highland, MD, 2 Units at 05/03/21 1153   ipratropium (ATROVENT HFA) inhaler 2 puff, 2 puff, Inhalation, Q6H, Gillis Santa, MD, 2 puff at 05/03/21 0908   losartan (COZAAR) tablet 25 mg, 25 mg, Oral, Daily, Wieting, Richard, MD, 25 mg at 05/03/21 0907   ondansetron (ZOFRAN-ODT) disintegrating tablet 4 mg, 4 mg, Oral, Q6H PRN **OR** ondansetron (ZOFRAN) injection 4 mg, 4 mg, Intravenous, Q6H PRN, Cintron-Diaz, Edgardo, MD   pantoprazole (PROTONIX) EC tablet 40 mg, 40 mg, Oral, QHS, Cintron-Diaz, Edgardo, MD, 40 mg at 05/02/21 2143   phenol (CHLORASEPTIC) mouth spray 1 spray, 1 spray, Mouth/Throat, PRN, Carolan Shiver, MD, 1 spray at 05/02/21 1314    ALLERGIES   Patient has no known allergies.     REVIEW OF SYSTEMS    Review of Systems:  Gen:  Denies  fever, sweats, chills weigh loss  HEENT: Denies blurred vision, double vision, ear pain, eye pain, hearing loss,  nose bleeds, sore throat Cardiac:  No dizziness, chest pain or heaviness, chest tightness,edema Resp:   Denies cough or sputum porduction, shortness of breath,wheezing, hemoptysis,  Gi: Denies swallowing difficulty, stomach pain, nausea or vomiting, diarrhea, constipation, bowel incontinence Gu:  Denies bladder incontinence, burning urine Ext:   Denies Joint pain, stiffness or swelling Skin: Denies  skin rash, easy bruising or bleeding or hives Endoc:  Denies polyuria, polydipsia , polyphagia or weight change Psych:   Denies depression, insomnia or hallucinations   Other:  All other systems negative   VS: BP (!) 139/96 (BP Location: Right Arm)    Pulse (!) 58    Temp 98.2 F (36.8 C)    Resp 18    Ht 5\' 11"  (1.803 m)    Wt 99.8 kg    SpO2 97%    BMI 30.68 kg/m      PHYSICAL EXAM  GENERAL:NAD, no fevers, chills, no weakness no fatigue HEAD: Normocephalic, atraumatic.  EYES: Pupils equal, round, reactive to light. Extraocular muscles intact. No scleral icterus.  MOUTH: Moist mucosal membrane. Dentition intact. No abscess noted.  EAR, NOSE, THROAT: Clear without exudates. No external lesions.  NECK: Supple. No thyromegaly. No nodules. No JVD.  PULMONARY: CTAB CARDIOVASCULAR: S1 and S2. Regular rate and rhythm. No murmurs, rubs, or gallops. No edema. Pedal pulses 2+ bilaterally.  GASTROINTESTINAL: No masses. Positive bowel sounds. No hepatosplenomegaly.  +surgical wound with dressing tender to touch MUSCULOSKELETAL: No swelling, clubbing, or edema. Range of motion full in all extremities.  NEUROLOGIC: Cranial nerves II through XII are intact. No gross focal neurological deficits. Sensation intact. Reflexes intact.  SKIN: No ulceration, lesions, rashes, or cyanosis. Skin warm and dry. Turgor intact.  PSYCHIATRIC: Mood, affect within normal limits. The patient is awake, alert and oriented x 3. Insight, judgment intact.       IMAGING    CT CHEST WO CONTRAST  Result Date:  04/27/2021 CLINICAL DATA:  Difficulty breathing, infiltrate left lung EXAM: CT CHEST WITHOUT CONTRAST TECHNIQUE: Multidetector CT imaging of the chest was performed following the standard protocol without IV contrast. COMPARISON:  Chest radiograph done earlier today FINDINGS: Cardiovascular: There are few tiny coronary artery calcifications. Mediastinum/Nodes: No significant lymphadenopathy seen. Lungs/Pleura: There is 3.6 cm focal ground-glass density in the anterolateral aspect of right upper lobe. There are other smaller foci of ground-glass densities in the medial right mid lung fields and posterior segment of right upper lobe. There are small foci of ground-glass infiltrates in the right middle lobe and right lower lobe. There are linear densities in the posterior right lower lung fields. There is complete atelectasis of left lower lobe. There are filling defects in the left main bronchus. No significant focal abnormality is seen in the left upper lobe. There is minimal left pleural effusion. There is no pneumothorax. Upper Abdomen: Tip of enteric tube is seen in the stomach. There is small amount of pneumoperitoneum adjacent to the liver. Pneumoperitoneum was also seen in the previous CT abdomen done on 04/25/2021. Increased density in the lumen of gallbladder may be related to vicarious contrast excretion into the bile or suggest presence of sludge or tiny stones. There is no wall thickening in gallbladder. Musculoskeletal: Unremarkable. IMPRESSION: There is complete atelectasis in the left lower lobe, possibly due to mucous plugging in the central bronchi. Small left pleural effusion. There are patchy ground-glass infiltrates in the right lung suggesting possible multifocal pneumonia. Small amount of pneumoperitoneum is noted adjacent to the liver which may suggest recent abdominal surgery or suggest bowel perforation. Other findings as described in the body of the report. Electronically Signed   By: Elmer Picker M.D.   On: 04/27/2021 11:01   CT ABDOMEN PELVIS W CONTRAST  Result Date: 04/26/2021 CLINICAL DATA:  RLQ abdominal pain (Age >= 14y) EXAM: CT ABDOMEN AND PELVIS WITH CONTRAST TECHNIQUE: Multidetector CT imaging of the abdomen and pelvis was performed using the standard protocol following bolus administration of intravenous contrast. CONTRAST:  145mL OMNIPAQUE IOHEXOL 300 MG/ML  SOLN COMPARISON:  None. FINDINGS: Lower chest: No acute abnormality. Hepatobiliary: No focal liver abnormality. No gallstones, gallbladder wall thickening, or pericholecystic fluid. No biliary dilatation. Pancreas: No focal lesion. Normal pancreatic contour. No surrounding inflammatory changes. No main pancreatic ductal dilatation. Spleen: Normal in size without focal abnormality. Adrenals/Urinary Tract: No adrenal nodule bilaterally. Bilateral kidneys enhance symmetrically. No hydronephrosis. No hydroureter. The urinary bladder is  unremarkable. Stomach/Bowel: Stomach is within normal limits. No evidence of bowel wall thickening or dilatation. No pneumatosis. Scattered colonic diverticulosis. Suggestion of appendectomy. Vascular/Lymphatic: No definite portal venous or mesenteric venous gas. The main portal, splenic, superior mesenteric veins are patent. No abdominal aorta or iliac aneurysm. Mild atherosclerotic plaque of the aorta and its branches. No abdominal, pelvic, or inguinal lymphadenopathy. Reproductive: Prostate is unremarkable. Other: Small volume free fluid and small volume pneumoperitoneum. Query origin of pneumoperitoneum and free fluid from a perforation involving the small bowel or rectum (5:43). No organized fluid collection. Musculoskeletal: Small fat containing umbilical hernia with an abdominal defect of 2.1 cm. Small fat containing left spigelian hernia just inferior to the level of the umbilicus with an abdominal defect of 0.9 cm. Possible tiny fat containing left inguinal hernia. No suspicious lytic or  blastic osseous lesions. No acute displaced fracture. Multilevel degenerative changes of the spine. IMPRESSION: 1. Small volume pneumoperitoneum and small volume ascites likely originating from a bowel perforation. Likely origin of perforation includes the small bowel or rectum in the anterior lower abdomen/pelvis. 2. Scattered colonic diverticulosis with no acute diverticulitis. 3. Small fat containing umbilical hernia with an abdominal defect of 2.1 cm. Small fat containing left spigelian hernia just inferior to the level of the umbilicus with an abdominal defect of 0.9 cm. Possible tiny fat containing left inguinal hernia. These results were called by telephone at the time of interpretation on 04/25/2021 at 11:57 pm to provider Dr. Leonides Schanz, who verbally acknowledged these results. Electronically Signed   By: Iven Finn M.D.   On: 04/26/2021 00:03   DG Chest Port 1 View  Result Date: 04/30/2021 CLINICAL DATA:  Left lower lobe infiltrate EXAM: PORTABLE CHEST 1 VIEW COMPARISON:  Radiograph 04/28/2021, chest CT 04/27/2021 FINDINGS: There is a nasogastric tube which passes below the diaphragm, tip excluded by collimation. Unchanged cardiomediastinal silhouette. There is continued improvement in left lower lung aeration. No new airspace disease. No pleural effusion. No pneumothorax. No acute osseous abnormality. IMPRESSION: Continued improvement in left lower lung aeration. No new airspace disease. Electronically Signed   By: Maurine Simmering M.D.   On: 04/30/2021 09:21   DG Chest Port 1 View  Result Date: 04/28/2021 CLINICAL DATA:  COVID positive patient. Follow-up left atelectasis. Clinical symptoms improving. EXAM: PORTABLE CHEST 1 VIEW COMPARISON:  04/27/2021 and older exams. FINDINGS: There has been a significant interval improvement in lung aeration, with most of the previously seen left mid to lower lung opacity resolving. There are subtle residual areas of opacity bilaterally. No new lung abnormalities. No  convincing pleural effusion.  No pneumothorax. Cardiac silhouette is normal in size. Normal mediastinal and hilar contours. Nasal/orogastric tube is stable, tip in the mid stomach. IMPRESSION: 1. Significant interval improvement with near complete resolution of the left mid to lower lung opacities. Electronically Signed   By: Lajean Manes M.D.   On: 04/28/2021 10:12   DG Chest Port 1 View  Result Date: 04/27/2021 CLINICAL DATA:  56 year old male positive COVID-19. Shortness of breath. EXAM: PORTABLE CHEST 1 VIEW COMPARISON:  04/26/2021 portable chest and earlier. FINDINGS: Portable AP semi upright view at 0543 hours. Stable enteric tube. Mildly lower lung volumes and interval substantial opacification of the left medial and lower lung. Some left hilar air bronchograms. Some leftward shift of the mediastinum. Visible mediastinal contours remain normal. Obscured left hemidiaphragm. No pneumothorax. Right lung appears negative. No pleural effusion is evident. No acute osseous abnormality identified. IMPRESSION: 1. Moderate collapse and/or consolidation of  the left lung since 04/26/2021. 2. Negative right lung.  Stable enteric tube. Electronically Signed   By: Genevie Ann M.D.   On: 04/27/2021 06:43   DG Chest Port 1 View  Result Date: 04/26/2021 CLINICAL DATA:  Short of breath, COVID-19 positive EXAM: PORTABLE CHEST 1 VIEW COMPARISON:  11/20/2018, 04/25/2021 FINDINGS: Single frontal view of the chest demonstrates enteric catheter passing below diaphragm tip excluded by collimation. Cardiac silhouette is stable. Increased consolidation at the left lung base with associated volume loss consistent with atelectasis. Small left pleural effusion is not excluded. No pneumothorax. No acute bony abnormalities. IMPRESSION: 1. Developing left basilar consolidation and associated volume loss, consistent with atelectasis. 2. Enteric catheter, tip excluded by collimation. Electronically Signed   By: Randa Ngo M.D.   On:  04/26/2021 15:18      ASSESSMENT/PLAN     Acute hypoxemic respiratory failure Acute COVID19 infection -Remdesevir antiviral - pharmacy protocol 5 d -vitamin C -zinc -decadron 6mg  IV daily >>>No steroids now -encourage to use IS and Acapella device for bronchopulmonary hygiene when able monitoring -PT/OT when possible -procalcitonin, CRP and ferritin trending   Complete left atelectasis -RESOLVED   Hypertonic saline nebulized 3% 41ml once daily-dcd  -incentive spirometry  -improved substantially  -repeat CXR reviewed  Bowel perforation ([place])     -As per[facility] and surgery Dr Peyton Najjar S/p ex lap, JP drain intact Continue Zosyn for peritonitis Continue IV fluid for hydration -Management per surgery 1/9 clear liquid diet started by general surgery, bowel functions returned, colostomy working well.    Thank you for allowing me to participate in the care of this patient.      Patient/Family are satisfied with care plan and all questions have been answered.   This document was prepared using Dragon voice recognition software and may include unintentional dictation errors.     Ottie Glazier, M.D.  Division of [place]

## 2021-05-03 NOTE — Progress Notes (Signed)
Progress Note:    Timothy Cameron    ZOX:[id number] DOB: [birth year] DOA: 04/26/2021  PCP: Preston Fleeting, MD    Brief Narrative:   56 year old male with type 2 diabetes mellitus, hypertension, hyperlipidemia presented to the hospital with abdominal pain and found to have a perforated bowel.  Patient was brought to the operating room by Dr. Hazle Quant on 04/26/2020.    Subjective:   Seen this morning. Doing well. NG tube now out. Tolerating diet. G/S planning discharge later today. No fevers or chills.     Assessment and Plan:   COVID-19 pneumonia/infection: With atelectasis and mucus plugging in the left lung and groundglass opacities/pneumonia in the left lung as per CT scan.  Completed course of remdesivir.  Not requiring any oxygen. Mucous plugging resolved with supportive care without bronchoscopy.  Continue ICS.  Acute peritonitis: Secondary to perforated viscus.  Fluid cultures growing E. coli.  On Unasyn. Discussed with ID this morning. Dr. Rivka Safer stated patient can be discharged on Augmentin.  Gram negative bacilli/rod bacteremia: 1 bottle. Unable to ID organism. I suspect likely E.coli from the peritonitis. Currently on Unasyn. Treat with Augmentin on discharge. Staph epidermis was a contaminant  Bowel perforation status post ex lap with sigmoid colectomy with anastomosis, appendectomy and incisional hernia repair: Management per surgery.  NG tube removed yesterday afternoon. Tolerating diet.  Type 2 diabetes mellitus: Continue SSI.  And scheduled 2 units with meals.  Well-controlled with last A1c 7.1. Has had steroid induced hyperglycemia. Now off steroids. This will improve. He can resume home Glipizide on discharge  Dyslipidemia: Continue home atorvastatin  Essential hypertension: Continue home losartan   Other information:    DVT prophylaxis: Lovenox subcu Code Status: Full code     Consultants:   PCCM, general surgery,  ID    Objective:    Vitals:   05/02/21 2025 05/02/21 2141 05/03/21 0419 05/03/21 0837  BP: (!) 142/88 134/81 (!) 148/86 (!) 139/96  Pulse: (!) 59 60 (!) 53 (!) 58  Resp: 18 16 16 18   Temp: 98.2 F (36.8 C) 97.9 F (36.6 C) 98.2 F (36.8 C) 98.2 F (36.8 C)  TempSrc:      SpO2: 98% 98% 100% 97%  Weight:      Height:       No intake or output data in the 24 hours ending 05/03/21 1539  Filed Weights   04/25/21 2222  Weight: 99.8 kg       Physical Exam:    General exam: Appears calm and comfortable Respiratory system: Clear to auscultation. Respiratory effort normal. Cardiovascular system: S1 & S2 heard, RRR. No JVD, murmurs, rubs, gallops or clicks. No pedal edema. Gastrointestinal system: Abdomen is nondistended, soft and nontender. No organomegaly or masses felt. Normal bowel sounds heard.  Ostomy appliance in place with staples clean dry and intact.  Incision site site looks clean and dry with no signs of infection.  JP drain in place. Central nervous system: Alert and oriented. No focal neurological deficits. Extremities: Symmetric 5 x 5 power. Skin: No rashes, lesions or ulcers Psychiatry: Judgement and insight appear normal. Mood & affect appropriate.     Data Reviewed:    I have personally reviewed following labs and imaging studies  CBC: Recent Labs  Lab 04/27/21 0500 04/28/21 0604 04/29/21 0504 04/30/21 0515 05/03/21 0401  WBC 14.5* 13.5* 10.7* 6.9 9.4  HGB 16.1 14.7 13.9 14.0 14.6  HCT 47.1 41.8 40.1 40.4 41.6  MCV 92.5 93.5 92.2  91.6 89.5  PLT 139* 142* 171 186 258     Basic Metabolic Panel: Recent Labs  Lab 04/27/21 0500 04/28/21 0604 04/29/21 0504 04/30/21 0515 05/03/21 0401  NA 136 134* 136 138 139  K 3.7 4.1 4.1 4.0 3.7  CL 106 104 106 106 103  CO2 24 21* 24 26 29   GLUCOSE 187* 206* 248* 207* 193*  BUN 18 27* 27* 28* 12  CREATININE 0.80 0.75 0.81 0.84 0.80  CALCIUM 8.0* 8.2* 8.4* 8.2* 8.8*  MG 2.0 2.1 2.1 1.9  --   PHOS  2.5 3.4 2.7 4.3  --      GFR: Estimated Creatinine Clearance: 125.6 mL/min (by C-G formula based on SCr of 0.8 mg/dL).  Liver Function Tests: Recent Labs  Lab 04/28/21 0604 04/29/21 0504 04/30/21 0515  AST 15 11* 12*  ALT 16 15 15   ALKPHOS 40 40 38  BILITOT 1.2 0.8 0.8  PROT 5.9* 5.6* 5.5*  ALBUMIN 2.8* 2.5* 2.5*     CBG: Recent Labs  Lab 05/02/21 1227 05/02/21 1647 05/02/21 2145 05/03/21 0836 05/03/21 1113  GLUCAP 234* 292* 348* 189* 267*      Recent Results (from the past 240 hour(s))  Resp Panel by RT-PCR (Flu A&B, Covid) Nasopharyngeal Swab     Status: Abnormal   Collection Time: 04/26/21 12:29 AM   Specimen: Nasopharyngeal Swab; Nasopharyngeal(NP) swabs in vial transport medium  Result Value Ref Range Status   SARS Coronavirus 2 by RT PCR POSITIVE (A) NEGATIVE Final    Comment: (NOTE) SARS-CoV-2 target nucleic acids are DETECTED.  The SARS-CoV-2 RNA is generally detectable in upper respiratory specimens during the acute phase of infection. Positive results are indicative of the presence of the identified virus, but do not rule out bacterial infection or co-infection with other pathogens not detected by the test. Clinical correlation with patient history and other diagnostic information is necessary to determine patient infection status. The expected result is Negative.  Fact Sheet for Patients: 07/01/21  Fact Sheet for Healthcare Providers: 06/24/21  This test is not yet approved or cleared by the [employer] FDA and  has been authorized for detection and/or diagnosis of SARS-CoV-2 by FDA under an Emergency Use Authorization (EUA).  This EUA will remain in effect (meaning this test can be used) for the duration of  the COVID-19 declaration under Section 564(b)(1) of the A ct, 21 U.S.C. section 360bbb-3(b)(1), unless the authorization is terminated or revoked sooner.      Influenza A by PCR NEGATIVE NEGATIVE Final   Influenza B by PCR NEGATIVE NEGATIVE Final    Comment: (NOTE) The Xpert Xpress SARS-CoV-2/FLU/RSV plus assay is intended as an aid in the diagnosis of influenza from Nasopharyngeal swab specimens and should not be used as a sole basis for treatment. Nasal washings and aspirates are unacceptable for Xpert Xpress SARS-CoV-2/FLU/RSV testing.  Fact Sheet for Patients: [employer]ious[employer]ker.it  Fact Sheet for Healthcare Providers: [place]  This test is not yet approved or cleared by the [employer] FDA and has been authorized for detection and/or diagnosis of SARS-CoV-2 by FDA under an Emergency Use Authorization (EUA). This EUA will remain in effect (meaning this test can be used) for the duration of the COVID-19 declaration under Section 564(b)(1) of the Act, 21 U.S.C. section 360bbb-3(b)(1), unless the authorization is terminated or revoked.  Performed at [facility], [address] [place], [address] [place]   Blood culture (routine x 2)     Status: Abnormal (Preliminary result)  Collection Time: 04/26/21 12:40 AM   Specimen: BLOOD  Result Value Ref Range Status   Specimen Description   Final    BLOOD LEFT ASSIST CONTROL Performed at [facility], [address] [place], [place] [address]    Special Requests   Final    BOTTLES DRAWN AEROBIC AND ANAEROBIC Blood Culture adequate volume Performed at [facility], [address] [place], [place] [address]    Culture  Setup Time   Final    Organism ID to follow AEROBIC BOTTLE ONLY GRAM POSITIVE COCCI CRITICAL RESULT CALLED TO, READ BACK BY AND VERIFIED WITH: Kevin FentonKISHAN PATEL AT 2044 04/26/21 BY TKR.PMF ANAEROBIC BOTTLE ONLY CRITICAL RESULT CALLED TO, READ BACK BY AND VERIFIED WITH: KRISTEN MERRILL AT 1403 04/27/21.PMF GRAM POSITIVE RODS GRAM NEGATIVE RODS CRITICAL RESULT CALLED TO, READ BACK BY AND  VERIFIED WITH: PHARM D S.WATSON ON [id number] AT 0848 BY E.PARRISH    Culture (A)  Final    STAPHYLOCOCCUS EPIDERMIDIS THE SIGNIFICANCE OF ISOLATING THIS ORGANISM FROM A SINGLE SET OF BLOOD CULTURES WHEN MULTIPLE SETS ARE DRAWN IS UNCERTAIN. PLEASE NOTIFY THE MICROBIOLOGY DEPARTMENT WITHIN ONE WEEK IF SPECIATION AND SENSITIVITIES ARE REQUIRED. GRAM NEGATIVE RODS Sent to [facility] for further susceptibility testing. Performed at [facility], [address] [place], [place] [address]    Report Status PENDING  Incomplete  Blood culture (routine x 2)     Status: None   Collection Time: 04/26/21 12:40 AM   Specimen: BLOOD  Result Value Ref Range Status   Specimen Description BLOOD RIGHT ASSIST CONTROL  Final   Special Requests   Final    BOTTLES DRAWN AEROBIC AND ANAEROBIC Blood Culture results may not be optimal due to an excessive volume of blood received in culture bottles   Culture   Final    NO GROWTH 5 DAYS Performed at [facility], [address] [place], [place] [address]    Report Status 05/01/2021 FINAL  Final  Blood Culture ID Panel (Reflexed)     Status: Abnormal   Collection Time: 04/26/21 12:40 AM  Result Value Ref Range Status   Enterococcus faecalis NOT DETECTED NOT DETECTED Final   Enterococcus Faecium NOT DETECTED NOT DETECTED Final   Listeria monocytogenes NOT DETECTED NOT DETECTED Final   Staphylococcus species DETECTED (A) NOT DETECTED Final    Comment: CRITICAL RESULT CALLED TO, READ BACK BY AND VERIFIED WITH: KISHAN PATEL @ 2044 ON 04/26/2021.Marland Kitchen.Marland Kitchen.TKR    Staphylococcus aureus (BCID) NOT DETECTED NOT DETECTED Final   Staphylococcus epidermidis DETECTED (A) NOT DETECTED Final    Comment: Methicillin (oxacillin) resistant coagulase negative staphylococcus. Possible blood culture contaminant (unless isolated from more than one blood culture draw or clinical case suggests pathogenicity). No antibiotic treatment is indicated for blood  culture  contaminants. CRITICAL RESULT CALLED TO, READ BACK BY AND VERIFIED WITH: KISHAN PATEL @ 2044 ON 04/26/2021.Marland Kitchen.Marland Kitchen.TKR    Staphylococcus lugdunensis NOT DETECTED NOT DETECTED Final   Streptococcus species NOT DETECTED NOT DETECTED Final   Streptococcus agalactiae NOT DETECTED NOT DETECTED Final   Streptococcus pneumoniae NOT DETECTED NOT DETECTED Final   Streptococcus pyogenes NOT DETECTED NOT DETECTED Final   A.calcoaceticus-baumannii NOT DETECTED NOT DETECTED Final   Bacteroides fragilis NOT DETECTED NOT DETECTED Final   Enterobacterales NOT DETECTED NOT DETECTED Final   Enterobacter cloacae complex NOT DETECTED NOT DETECTED Final   Escherichia coli NOT DETECTED NOT DETECTED Final   Klebsiella aerogenes NOT DETECTED NOT DETECTED Final   Klebsiella oxytoca NOT  DETECTED NOT DETECTED Final   Klebsiella pneumoniae NOT DETECTED NOT DETECTED Final   Proteus species NOT DETECTED NOT DETECTED Final   Salmonella species NOT DETECTED NOT DETECTED Final   Serratia marcescens NOT DETECTED NOT DETECTED Final   Haemophilus influenzae NOT DETECTED NOT DETECTED Final   Neisseria meningitidis NOT DETECTED NOT DETECTED Final   Pseudomonas aeruginosa NOT DETECTED NOT DETECTED Final   Stenotrophomonas maltophilia NOT DETECTED NOT DETECTED Final   Candida albicans NOT DETECTED NOT DETECTED Final   Candida auris NOT DETECTED NOT DETECTED Final   Candida glabrata NOT DETECTED NOT DETECTED Final   Candida krusei NOT DETECTED NOT DETECTED Final   Candida parapsilosis NOT DETECTED NOT DETECTED Final   Candida tropicalis NOT DETECTED NOT DETECTED Final   Cryptococcus neoformans/gattii NOT DETECTED NOT DETECTED Final   Methicillin resistance mecA/C DETECTED (A) NOT DETECTED Final    Comment: CRITICAL RESULT CALLED TO, READ BACK BY AND VERIFIED WITH: KISHAN PATEL @ 2044 ON 04/26/2021.Marland KitchenMarland KitchenTKR Performed at [facility], [address] [place], [place] [address]   Aerobic/Anaerobic Culture w Gram Stain  (surgical/deep wound)     Status: None   Collection Time: 04/26/21  6:20 AM   Specimen: PATH Other; Abscess  Result Value Ref Range Status   Specimen Description   Final    ABSCESS Performed at [facility], [address], [place], [place] [address]    Special Requests   Final    NONE Performed at [facility], [address] [place], [place] [address]    Gram Stain   Final    NO WBC SEEN FEW Romie Minus NEGATIVE RODS FEW GRAM POSITIVE RODS RARE GRAM POSITIVE COCCI IN PAIRS Performed at [facility], [address], [place], [place] [address]    Culture   Final    FEW ESCHERICHIA COLI FEW LACTOBACILLUS SPECIES Standardized susceptibility testing for this organism is not available. MIXED ANAEROBIC FLORA PRESENT.  CALL LAB IF FURTHER IID REQUIRED.    Report Status 04/29/2021 FINAL  Final   Organism ID, Bacteria ESCHERICHIA COLI  Final      Susceptibility   Escherichia coli - MIC*    AMPICILLIN <=2 SENSITIVE Sensitive     CEFAZOLIN <=4 SENSITIVE Sensitive     CEFEPIME <=0.12 SENSITIVE Sensitive     CEFTAZIDIME <=1 SENSITIVE Sensitive     CEFTRIAXONE <=0.25 SENSITIVE Sensitive     CIPROFLOXACIN >=4 RESISTANT Resistant     GENTAMICIN <=1 SENSITIVE Sensitive     IMIPENEM <=0.25 SENSITIVE Sensitive     TRIMETH/SULFA <=20 SENSITIVE Sensitive     AMPICILLIN/SULBACTAM <=2 SENSITIVE Sensitive     PIP/TAZO <=4 SENSITIVE Sensitive     * FEW ESCHERICHIA COLI  Blood Culture ID Panel (Reflexed)     Status: None   Collection Time: 04/27/21 12:40 AM  Result Value Ref Range Status   Enterococcus faecalis NOT DETECTED NOT DETECTED Final   Enterococcus Faecium NOT DETECTED NOT DETECTED Final   Listeria monocytogenes NOT DETECTED NOT DETECTED Final   Staphylococcus species NOT DETECTED NOT DETECTED Final   Staphylococcus aureus (BCID) NOT DETECTED NOT DETECTED Final   Staphylococcus epidermidis NOT DETECTED NOT DETECTED Final   Staphylococcus lugdunensis NOT DETECTED  NOT DETECTED Final   Streptococcus species NOT DETECTED NOT DETECTED Final   Streptococcus agalactiae NOT DETECTED NOT DETECTED Final   Streptococcus pneumoniae NOT DETECTED NOT DETECTED Final   Streptococcus pyogenes NOT DETECTED NOT DETECTED Final   A.calcoaceticus-baumannii NOT DETECTED NOT DETECTED Final  Bacteroides fragilis NOT DETECTED NOT DETECTED Final   Enterobacterales NOT DETECTED NOT DETECTED Final   Enterobacter cloacae complex NOT DETECTED NOT DETECTED Final   Escherichia coli NOT DETECTED NOT DETECTED Final   Klebsiella aerogenes NOT DETECTED NOT DETECTED Final   Klebsiella oxytoca NOT DETECTED NOT DETECTED Final   Klebsiella pneumoniae NOT DETECTED NOT DETECTED Final   Proteus species NOT DETECTED NOT DETECTED Final   Salmonella species NOT DETECTED NOT DETECTED Final   Serratia marcescens NOT DETECTED NOT DETECTED Final   Haemophilus influenzae NOT DETECTED NOT DETECTED Final   Neisseria meningitidis NOT DETECTED NOT DETECTED Final   Pseudomonas aeruginosa NOT DETECTED NOT DETECTED Final   Stenotrophomonas maltophilia NOT DETECTED NOT DETECTED Final   Candida albicans NOT DETECTED NOT DETECTED Final   Candida auris NOT DETECTED NOT DETECTED Final   Candida glabrata NOT DETECTED NOT DETECTED Final   Candida krusei NOT DETECTED NOT DETECTED Final   Candida parapsilosis NOT DETECTED NOT DETECTED Final   Candida tropicalis NOT DETECTED NOT DETECTED Final   Cryptococcus neoformans/gattii NOT DETECTED NOT DETECTED Final    Comment: Performed at [facility], [address] [place], [place] [address]          Radiology Studies:    No results found.      Medications:    Scheduled Meds:  albuterol  2 puff Inhalation Q6H   atorvastatin  20 mg Oral QHS   enoxaparin (LOVENOX) injection  0.5 mg/kg Subcutaneous Q24H   gabapentin  300 mg Oral BID   guaiFENesin  600 mg Oral BID   insulin aspart  0-15 Units Subcutaneous TID WC   insulin aspart  2 Units  Subcutaneous TID WC   ipratropium  2 puff Inhalation Q6H   losartan  25 mg Oral Daily   pantoprazole  40 mg Oral QHS   Continuous Infusions:  ampicillin-sulbactam (UNASYN) IV 3 g (05/03/21 1153)       LOS: 7 days    Time spent: 35 minutes    Verdia KubaShayan S Adelei Scobey, MD [facility]   To contact the attending provider between 7A-7P or the covering provider during after hours 7P-7A, please log into the web site [email or web address] and access using universal [place] password for that web site. If you do not have the password, please call the hospital operator.  05/03/2021, 3:39 PM

## 2021-05-03 NOTE — Discharge Instructions (Signed)
°  Diet: Resume home heart healthy regular diet.   Activity: No heavy lifting >20 pounds (children, pets, laundry, garbage) or strenuous activity until follow-up, but light activity and walking are encouraged. Do not drive or drink alcohol if taking narcotic pain medications.  Wound care: May shower with soapy water and pat dry (do not rub incisions), but no baths or submerging incision underwater until follow-up. (no swimming)   Continue colostomy care as instructed  Medications: Resume all home medications. For mild to moderate pain: acetaminophen (Tylenol) or ibuprofen (if no kidney disease). Combining Tylenol with alcohol can substantially increase your risk of causing liver disease. Narcotic pain medications, if prescribed, can be used for severe pain, though may cause nausea, constipation, and drowsiness. Do not combine Tylenol and Norco within a 6 hour period as Norco contains Tylenol. If you do not need the narcotic pain medication, you do not need to fill the prescription.  Call office [phone]) at any time if any questions, worsening pain, fevers/chills, bleeding, drainage from incision site, or other concerns.

## 2021-05-09 LAB — MISC LABCORP TEST (SEND OUT)
LabCorp test name: 182345
Labcorp test code: 182345

## 2021-05-10 ENCOUNTER — Other Ambulatory Visit: Payer: Self-pay | Admitting: General Surgery

## 2021-05-10 DIAGNOSIS — K559 Vascular disorder of intestine, unspecified: Secondary | ICD-10-CM

## 2021-05-10 LAB — CULTURE, BLOOD (ROUTINE X 2): Special Requests: ADEQUATE

## 2021-05-14 DIAGNOSIS — Z932 Ileostomy status: Secondary | ICD-10-CM | POA: Diagnosis not present

## 2021-05-15 ENCOUNTER — Other Ambulatory Visit: Payer: 59

## 2021-05-16 DIAGNOSIS — R972 Elevated prostate specific antigen [PSA]: Secondary | ICD-10-CM | POA: Diagnosis not present

## 2021-05-16 DIAGNOSIS — I158 Other secondary hypertension: Secondary | ICD-10-CM | POA: Diagnosis not present

## 2021-05-16 DIAGNOSIS — K5720 Diverticulitis of large intestine with perforation and abscess without bleeding: Secondary | ICD-10-CM | POA: Diagnosis not present

## 2021-05-16 DIAGNOSIS — E785 Hyperlipidemia, unspecified: Secondary | ICD-10-CM | POA: Diagnosis not present

## 2021-05-16 DIAGNOSIS — Z0000 Encounter for general adult medical examination without abnormal findings: Secondary | ICD-10-CM | POA: Diagnosis not present

## 2021-05-16 DIAGNOSIS — E1169 Type 2 diabetes mellitus with other specified complication: Secondary | ICD-10-CM | POA: Diagnosis not present

## 2021-05-16 DIAGNOSIS — K631 Perforation of intestine (nontraumatic): Secondary | ICD-10-CM | POA: Diagnosis not present

## 2021-05-16 DIAGNOSIS — Z8619 Personal history of other infectious and parasitic diseases: Secondary | ICD-10-CM | POA: Diagnosis not present

## 2021-06-07 ENCOUNTER — Ambulatory Visit
Admission: RE | Admit: 2021-06-07 | Discharge: 2021-06-07 | Disposition: A | Discharge status: Home / Self Care | Payer: 59 | Source: Ambulatory Visit | Attending: General Surgery | Admitting: General Surgery

## 2021-06-07 ENCOUNTER — Other Ambulatory Visit: Payer: Self-pay

## 2021-06-07 DIAGNOSIS — K9189 Other postprocedural complications and disorders of digestive system: Secondary | ICD-10-CM | POA: Diagnosis not present

## 2021-06-07 DIAGNOSIS — K559 Vascular disorder of intestine, unspecified: Secondary | ICD-10-CM | POA: Diagnosis not present

## 2021-06-07 MED ORDER — IOHEXOL 300 MG/ML  SOLN
450.0000 mL | Freq: Once | INTRAMUSCULAR | Status: AC | PRN
  Administered 2021-06-07: 450 mL

## 2021-06-26 ENCOUNTER — Ambulatory Visit: Payer: Self-pay | Admitting: General Surgery

## 2021-06-26 DIAGNOSIS — Z932 Ileostomy status: Secondary | ICD-10-CM | POA: Diagnosis not present

## 2021-06-26 NOTE — H&P (Signed)
HISTORY OF PRESENT ILLNESS:  ?  ?Mr. Schmieder is a 56 y.o.male patient who comes for preoperative evaluation for ileostomy reversal. ?  ?Patient with history of perforated sigmoid colon due to ischemic colitis.  He underwent partial colectomy with anastomosis and loop ileostomy creation. ?  ?The patient has been recovering adequately.  He has been taking great care of the colostomy.  Patient is eating well.  No issues with the ileostomy bag.  Adequate skin care. ?  ?He had a Gastrografin enema imaging study that shows initial narrowing of the anastomosis with eventual resolution with adequate lumen of proximal and distal colon.  There was no other masses identified. ?   ?  ?PAST MEDICAL HISTORY:  ?    ?Past Medical History:  ?Diagnosis Date  ? Diabetes mellitus type 2, controlled (CMS-HCC)    ? HLD (hyperlipidemia)    ? HTN (hypertension)    ?  ?  ?  ?PAST SURGICAL HISTORY:   ?     ?Past Surgical History:  ?Procedure Laterality Date  ? APPENDECTOMY   04/26/2021  ?  Dr Arrie Senate  ? 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  Dr. Carolan Shiver  ? colon surgery      ?  before 2006 --- Kendell Bane  ?    ?   ?MEDICATIONS:  ?Encounter Medications  ?      ?Outpatient Encounter Medications as of 06/26/2021  ?Medication Sig Dispense Refill  ? atorvastatin (LIPITOR) 20 MG tablet Take 1 tablet (20 mg total) by mouth at bedtime 90 tablet 2  ? glipiZIDE (GLUCOTROL XL) 10 MG XL tablet Take 1 tablet (10 mg total) by mouth once daily 90 tablet 2  ? hydroCHLOROthiazide (HYDRODIURIL) 25 MG tablet Take 1 tablet (25 mg total) by mouth once daily 90 tablet 2  ? losartan (COZAAR) 50 MG tablet Take 1 tablet (50 mg total) by mouth once daily 90 tablet 2  ? omeprazole (PRILOSEC) 20 MG DR capsule Take 1 capsule (20 mg total) by mouth once daily 90 capsule 2  ? psyllium husk, bulk, 100 % Powd Take by mouth at bedtime      ? sennosides-docusate (SENOKOT-S) 8.6-50 mg tablet Take 3 tablets by mouth at bedtime       ?  ?No facility-administered encounter medications on file as of 06/26/2021.  ?  ?  ?  ?ALLERGIES:   ?Patient has no known allergies. ?  ?SOCIAL HISTORY:  ?Social History  ?  ?  ?     ?Socioeconomic History  ? Marital status: Single  ?Tobacco Use  ? Smoking status: Former  ?    Types: Cigarettes  ?    Quit date: 04/26/2021  ?    Years since quitting: 0.1  ? Smokeless tobacco: Never  ?Vaping Use  ? Vaping Use: Never used  ?Substance and Sexual Activity  ? Alcohol use: Yes  ?    Comment: occasionally  ? Drug use: Not Currently  ?    Comment: prior to 2017 at least  ? Sexual activity: Yes  ?  ?  ?  ?FAMILY HISTORY:  ?     ?Family History  ?Problem Relation Age of Onset  ? Diabetes Mother    ? High blood pressure (Hypertension) Mother    ? Irregular Heart Beat (Arrhythmia) Mother    ? Diabetes Father    ? High blood pressure (Hypertension) Father    ? COPD Brother    ?  Diabetes Brother    ? Hyperlipidemia (Elevated cholesterol) Brother    ? High blood pressure (Hypertension) Brother    ? Obesity Brother    ?  ?  ?GENERAL REVIEW OF SYSTEMS:  ?  ?General ROS: negative for - chills, fatigue, fever, weight gain or weight loss ?Allergy and Immunology ROS: negative for - hives  ?Hematological and Lymphatic ROS: negative for - bleeding problems or bruising, negative for palpable nodes ?Endocrine ROS: negative for - heat or cold intolerance, hair changes ?Respiratory ROS: negative for - cough, shortness of breath or wheezing ?Cardiovascular ROS: no chest pain or palpitations ?GI ROS: negative for nausea, vomiting, abdominal pain, diarrhea, constipation ?Musculoskeletal ROS: negative for - joint swelling or muscle pain ?Neurological ROS: negative for - confusion, syncope ?Dermatological ROS: negative for pruritus and rash ?  ?PHYSICAL EXAM:  ?   ?Vitals:  ?  06/26/21 0958  ?BP: (!) 168/88  ?Pulse: 88  ?.  ?Ht:180.3 cm (5\' 11" ) Wt:95.7 kg (211 lb) surface area is 2.19 meters squared. ?Body mass index is 29.43 kg/m?DVV:OHYW. ?   ?GENERAL: Alert, active, oriented x3 ?  ?HEENT: Pupils equal reactive to light. Extraocular movements are intact. Sclera clear. Palpebral conjunctiva normal red color.Pharynx clear. ?  ?NECK: Supple with no palpable mass and no adenopathy. ?  ?LUNGS: Sound clear with no rales rhonchi or wheezes. ?  ?HEART: Regular rhythm S1 and S2 without murmur. ?  ?ABDOMEN: Soft and depressible, nontender with no palpable mass, no hepatomegaly.  Ileostomy is pink and patent ?  ?EXTREMITIES: Well-developed well-nourished symmetrical with no dependent edema. ?  ?NEUROLOGICAL: Awake alert oriented, facial expression symmetrical, moving all extremities. ?  ?   ?IMPRESSION:  ?  ? Status post ileostomy (CMS-HCC) [Z93.2] ? -Patient with history of partial sigmoid colectomy with anastomosis and end ileostomy creation. ?-Gastrografin enema shows initial narrowing with eventual resolution and adequate lumen of both sides.  To confirm adequate patency of the anastomosis I discussed with patient the need of short sigmoidoscopy.  I discussed the case with Dr. Marland Kitchen who agreed to do the chart sigmoidoscopy for evaluation.  If there is adequate lumen through the anastomosis I discussed with patient the plan for ileostomy reversal. ?-I discussed with patient the risk of surgery that includes pain, infection, bleeding, anastomosis leak, injury to adjacent organ, enterostomies, enterocutaneous fistula, among others.  The patient report he understood and agreed to proceed. ?  ?PLAN:  ?1.  Short sigmoidoscopy for evaluation of colonic anastomosis ?2.  If sigmoidoscopy shows adequate anastomosis lumen will proceed with ileostomy reversal (Lemar Livings) ?3.  Avoid taking aspirin 5 days before the procedures ?4.  Contact [phone] if you have any concern ?  ?Patient verbalized understanding, all questions were answered, and were agreeable with the plan outlined above.  ?  ?[place], MD ?  ?Electronically signed by Carolan Shiver, MD ? ?

## 2021-06-28 ENCOUNTER — Other Ambulatory Visit: Payer: Self-pay | Admitting: General Surgery

## 2021-06-28 NOTE — Progress Notes (Signed)
Subjective:  ?  ? Patient ID: Timothy Cameron is a 56 y.o. male. ?  ?HPI ?  ?The following portions of the patient's history were reviewed and updated as appropriate. ?  ?This a new patient is here today for: office visit. Patient was referred by Dr. Maia Plan for evaluation of flexible sigmoidoscopy. ?  ?  ?   ?Chief Complaint  ?Patient presents with  ? Pre-op Exam  ?  ?  ?BP (!) 168/88   Pulse 88   Ht 180.3 cm (5\' 11" )   Wt 95.7 kg (211 lb)   SpO2 97%   BMI 29.43 kg/m?  ?  ?    ?Past Medical History:  ?Diagnosis Date  ? Diabetes mellitus type 2, controlled (CMS-HCC)    ? HLD (hyperlipidemia)    ? HTN (hypertension)    ?  ?  ?     ?Past Surgical History:  ?Procedure Laterality Date  ? APPENDECTOMY   04/26/2021  ?  Dr 06/24/2021  ? 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  Dr. 06/24/2021  ? colon surgery      ?  before 2006 --- 2007  ?  ?  ?  ?  ?Social History  ?  ?     ?Socioeconomic History  ? Marital status: Single  ?Tobacco Use  ? Smoking status: Former  ?    Types: Cigarettes  ?    Quit date: 04/26/2021  ?    Years since quitting: 0.1  ? Smokeless tobacco: Never  ?Vaping Use  ? Vaping Use: Never used  ?Substance and Sexual Activity  ? Alcohol use: Yes  ?    Comment: occasionally  ? Drug use: Not Currently  ?    Comment: prior to 2017 at least  ? Sexual activity: Yes  ?  ?  ?No Known Allergies ?  ?      ?Current Outpatient Medications  ?Medication Sig Dispense Refill  ? atorvastatin (LIPITOR) 20 MG tablet Take 1 tablet (20 mg total) by mouth at bedtime 90 tablet 2  ? glipiZIDE (GLUCOTROL XL) 10 MG XL tablet Take 1 tablet (10 mg total) by mouth once daily 90 tablet 2  ? hydroCHLOROthiazide (HYDRODIURIL) 25 MG tablet Take 1 tablet (25 mg total) by mouth once daily 90 tablet 2  ? losartan (COZAAR) 50 MG tablet Take 1 tablet (50 mg total) by mouth once daily 90 tablet 2  ? omeprazole (PRILOSEC) 20 MG DR capsule Take 1 capsule (20 mg total) by mouth once daily 90  capsule 2  ? psyllium husk, bulk, 100 % Powd Take by mouth at bedtime      ? sennosides-docusate (SENOKOT-S) 8.6-50 mg tablet Take 3 tablets by mouth at bedtime      ?  ?No current facility-administered medications for this visit.  ?  ?  ?     ?Family History  ?Problem Relation Age of Onset  ? Diabetes Mother    ? High blood pressure (Hypertension) Mother    ? Irregular Heart Beat (Arrhythmia) Mother    ? Diabetes Father    ? High blood pressure (Hypertension) Father    ? COPD Brother    ? Diabetes Brother    ? Hyperlipidemia (Elevated cholesterol) Brother    ? High blood pressure (Hypertension) Brother    ? Obesity Brother    ?  ?  ?  ?Labs and Radiology:  ?  ?April 26, 2021 pathology: ?  ?  DIAGNOSIS:  ?A. APPENDIX; APPENDECTOMY:  ?- APPENDIX WITH MILD ACUTE SEROSITIS.  ?- OBLITERATION OF THE DISTAL LUMEN.  ?- NEGATIVE FOR MALIGNANCY.  ?  ?B.  COLON, SIGMOID; RESECTION:  ?- ISCHEMIC COLITIS WITH TRANSMURAL NECROSIS AND SUPPURATIVE SEROSITIS  ?CONSISTENT WITH PERFORATION.  ?- DIVERTICULOSIS.  ?- UNDESIGNATED MARGIN WITH FOCAL MUCOSAL ISCHEMIA AND SUBMUCOSAL  ?INFLAMMATION.  ?- NEGATIVE FOR DYSPLASIA AND MALIGNANCY.  ?  ?June 07, 2021 contrast study: ?  ?IMPRESSION: ?Caliber change at the distal colonic anastomosis decreases over the ?course of the exam. By the end of the exam, the colon immediately ?proximal to the anastomosis is similar in caliber to the descending ?colon. There is no abrupt or tight stricture evident. No contrast ?extravasation at the anastomosis. ?  ?Contrast material was refluxed to the level of the distal ileum with ?no gross colonic abnormality evident. ?  ?These films have been independently reviewed. ?  ?CT of the abdomen pelvis dated April 25, 2021: ?  ?This study was independently reviewed.  The celiac, SMA and IMA vessels all appear patent. ?  ?Review of Systems  ?Constitutional: Negative for chills and fever.  ?Respiratory: Negative for cough.   ?  ?  ?   ?Objective:  ? Physical  Exam ?Constitutional:   ?   Appearance: Normal appearance.  ?Cardiovascular:  ?   Rate and Rhythm: Normal rate and regular rhythm.  ?   Pulses: Normal pulses.  ?   Heart sounds: Normal heart sounds.  ?Pulmonary:  ?   Effort: Pulmonary effort is normal.  ?   Breath sounds: Normal breath sounds.  ?Musculoskeletal:  ?   Cervical back: Neck supple.  ?Neurological:  ?   Mental Status: He is alert and oriented to person, place, and time.  ?Psychiatric:     ?   Mood and Affect: Mood normal.     ?   Behavior: Behavior normal.  ?  ?  ?  ?   ?Assessment:  ?   ?Slight narrowing at the anastomosis on recent contrast study, possible spasm versus related to ischemia. ?   ?Plan:  ?   ?Indications for endoscopic view was reviewed with the patient.  With his ability ostomy for multiple prep" is not possible.  We will have the patient use of 2, 1 L tapwater enemas prior to the procedure.  He reports being familiar with enemas. ?  ?Patient will be scheduled for a flexible sigmoidoscopy with sedation for next Wednesday, 07-04-21 at [facility]. Instructions have been provided by the staff today. Patient has been asked to make use of two tap water enemas one two hours prior and the other one hour prior to his arrival time day of procedure. He is aware no solids after midnight but may have clear liquids up until 3 hours prior. Patient also aware to have a driver the day of the procedure.  ?   ?This note is partially prepared by Wendall Stade, CMA acting as a scribe in the presence of Dr. Donnalee Curry, MD.  ?  ?The documentation recorded by the scribe accurately reflects the service I personally performed and the decisions made by me.  ?  ?Earline Mayotte, MD FACS ?

## 2021-06-29 ENCOUNTER — Encounter
Admission: RE | Admit: 2021-06-29 | Discharge: 2021-06-29 | Disposition: A | Discharge status: Home / Self Care | Payer: 59 | Source: Ambulatory Visit | Attending: General Surgery | Admitting: General Surgery

## 2021-06-29 ENCOUNTER — Other Ambulatory Visit: Payer: Self-pay

## 2021-06-29 DIAGNOSIS — I10 Essential (primary) hypertension: Secondary | ICD-10-CM

## 2021-06-29 DIAGNOSIS — Z79899 Other long term (current) drug therapy: Secondary | ICD-10-CM

## 2021-06-29 HISTORY — DX: Bacteremia: R78.81

## 2021-06-29 HISTORY — DX: Diverticulitis of intestine, part unspecified, without perforation or abscess without bleeding: K57.92

## 2021-06-29 HISTORY — DX: Gastrointestinal hemorrhage, unspecified: K92.2

## 2021-06-29 HISTORY — DX: Thrombocytopenia, unspecified: D69.6

## 2021-06-29 HISTORY — DX: Pneumonia, unspecified organism: J18.9

## 2021-06-29 HISTORY — DX: Atelectasis: J98.11

## 2021-06-29 NOTE — Patient Instructions (Signed)
Your procedure is scheduled on:07-09-21 Monday ?Report to the Registration Desk on the 1st floor of the Medical Mall.Then proceed to the 2nd floor Surgery Desk in the Medical Mall ?To find out your arrival time, please call [phone] between 1PM - 3PM on:07-06-21 Friday ? ?REMEMBER: ?Instructions that are not followed completely may result in serious medical risk, up to and including death; or upon the discretion of your surgeon and anesthesiologist your surgery may need to be rescheduled. ? ?Do not eat food OR drink any liquids after midnight the night before surgery.  ?No gum chewing, lozengers or hard candies ? ?TAKE THESE MEDICATIONS THE MORNING OF SURGERY WITH A SIP OF WATER: ?-omeprazole (PRILOSEC)-take one the night before and one on the morning of surgery - helps to prevent nausea after surgery.) ? ?One week prior to surgery: ?Stop Anti-inflammatories (NSAIDS) such as Advil, Aleve, Ibuprofen, Motrin, Naproxen, Naprosyn and Aspirin based products such as Excedrin, Goodys Powder, BC Powder.You may however, continue to take Tylenol if needed for pain up until the day of surgery. ? ?Stop ANY OVER THE COUNTER supplements/vitamins 7 days until after surgery (Multivitamin) ? ?No Alcohol for 24 hours before or after surgery. ? ?No Smoking including e-cigarettes for 24 hours prior to surgery.  ?No chewable tobacco products for at least 6 hours prior to surgery.  ?No nicotine patches on the day of surgery. ? ?Do not use any "recreational" drugs for at least a week prior to your surgery.  ?Please be advised that the combination of cocaine and anesthesia may have negative outcomes, up to and including death. ?If you test positive for cocaine, your surgery will be cancelled. ? ?On the morning of surgery brush your teeth with toothpaste and water, you may rinse your mouth with mouthwash if you wish. ?Do not swallow any toothpaste or mouthwash. ? ?Use CHG Soap as directed on instruction sheet. ? ?Do not wear jewelry,  make-up, hairpins, clips or nail polish. ? ?Do not wear lotions, powders, or perfumes.  ? ?Do not shave body from the neck down 48 hours prior to surgery just in case you cut yourself which could leave a site for infection.  ?Also, freshly shaved skin may become irritated if using the CHG soap. ? ?Contact lenses, hearing aids and dentures may not be worn into surgery. ? ?Do not bring valuables to the hospital. [facility] is not responsible for any missing/lost belongings or valuables.  ? ?Notify your doctor if there is any change in your medical condition (cold, fever, infection). ? ?Wear comfortable clothing (specific to your surgery type) to the hospital. ? ?After surgery, you can help prevent lung complications by doing breathing exercises.  ?Take deep breaths and cough every 1-2 hours. Your doctor may order a device called an Incentive Spirometer to help you take deep breaths. ?When coughing or sneezing, hold a pillow firmly against your incision with both hands. This is called ?splinting.? Doing this helps protect your incision. It also decreases belly discomfort. ? ?If you are being admitted to the hospital overnight, leave your suitcase in the car. ?After surgery it may be brought to your room. ? ?If you are being discharged the day of surgery, you will not be allowed to drive home. ?You will need a responsible adult (18 years or older) to drive you home and stay with you that night.  ? ?If you are taking public transportation, you will need to have a responsible adult (18 years or older) with you. ?Please confirm with  your physician that it is acceptable to use public transportation.  ? ?Please call the Pre-admissions Testing Dept. at [phone] if you have any questions about these instructions. ? ?Surgery Visitation Policy: ? ?Patients undergoing a surgery or procedure may have one family member or support person with them as long as that person is not COVID-19 positive or experiencing its symptoms.   ?That person may remain in the waiting area during the procedure and may rotate out with other people. ? ?Inpatient Visitation:   ? ?Visiting hours are 7 a.m. to 8 p.m. ?Up to two visitors ages 16+ are allowed at one time in a patient room. The visitors may rotate out with other people during the day. Visitors must check out when they leave, or other visitors will not be allowed. One designated support person may remain overnight. ?The visitor must pass COVID-19 screenings, use hand sanitizer when entering and exiting the patient?s room and wear a mask at all times, including in the patient?s room. ?Patients must also wear a mask when staff or their visitor are in the room. ?Masking is required regardless of vaccination status.  ?

## 2021-07-02 ENCOUNTER — Encounter
Admission: RE | Admit: 2021-07-02 | Discharge: 2021-07-02 | Disposition: A | Discharge status: Home / Self Care | Payer: 59 | Source: Ambulatory Visit | Attending: General Surgery | Admitting: General Surgery

## 2021-07-02 ENCOUNTER — Other Ambulatory Visit: Payer: Self-pay

## 2021-07-02 DIAGNOSIS — Z01810 Encounter for preprocedural cardiovascular examination: Secondary | ICD-10-CM | POA: Diagnosis not present

## 2021-07-02 DIAGNOSIS — Z01818 Encounter for other preprocedural examination: Secondary | ICD-10-CM | POA: Diagnosis not present

## 2021-07-02 DIAGNOSIS — E119 Type 2 diabetes mellitus without complications: Secondary | ICD-10-CM | POA: Insufficient documentation

## 2021-07-02 DIAGNOSIS — I10 Essential (primary) hypertension: Secondary | ICD-10-CM | POA: Insufficient documentation

## 2021-07-02 DIAGNOSIS — Z79899 Other long term (current) drug therapy: Secondary | ICD-10-CM | POA: Insufficient documentation

## 2021-07-02 LAB — POTASSIUM: Potassium: 3.9 mmol/L (ref 3.5–5.1)

## 2021-07-03 ENCOUNTER — Encounter: Payer: Self-pay | Admitting: General Surgery

## 2021-07-04 ENCOUNTER — Other Ambulatory Visit: Payer: Self-pay

## 2021-07-04 ENCOUNTER — Encounter: Payer: Self-pay | Admitting: General Surgery

## 2021-07-04 ENCOUNTER — Ambulatory Visit: Payer: 59 | Admitting: Certified Registered Nurse Anesthetist

## 2021-07-04 ENCOUNTER — Ambulatory Visit
Admission: RE | Admit: 2021-07-04 | Discharge: 2021-07-04 | Disposition: A | Discharge status: Home / Self Care | Payer: 59 | Attending: General Surgery | Admitting: General Surgery

## 2021-07-04 ENCOUNTER — Encounter
Admission: RE | Disposition: A | Discharge status: Home / Self Care | Payer: Self-pay | Source: Home / Self Care | Attending: General Surgery

## 2021-07-04 DIAGNOSIS — K6389 Other specified diseases of intestine: Secondary | ICD-10-CM | POA: Insufficient documentation

## 2021-07-04 DIAGNOSIS — Z538 Procedure and treatment not carried out for other reasons: Secondary | ICD-10-CM | POA: Diagnosis not present

## 2021-07-04 DIAGNOSIS — Z9049 Acquired absence of other specified parts of digestive tract: Secondary | ICD-10-CM | POA: Diagnosis not present

## 2021-07-04 DIAGNOSIS — E1165 Type 2 diabetes mellitus with hyperglycemia: Secondary | ICD-10-CM | POA: Diagnosis not present

## 2021-07-04 HISTORY — DX: Sleep apnea, unspecified: G47.30

## 2021-07-04 LAB — BASIC METABOLIC PANEL
Anion gap: 9 (ref 5–15)
BUN: 25 mg/dL — ABNORMAL HIGH (ref 6–20)
CO2: 23 mmol/L (ref 22–32)
Calcium: 9.1 mg/dL (ref 8.9–10.3)
Chloride: 99 mmol/L (ref 98–111)
Creatinine, Ser: 1.26 mg/dL — ABNORMAL HIGH (ref 0.61–1.24)
GFR, Estimated: >60 mL/min (ref >60)
Glucose, Bld: 597 mg/dL (ref 70–99)
Potassium: 4.1 mmol/L (ref 3.5–5.1)
Sodium: 131 mmol/L — ABNORMAL LOW (ref 135–145)

## 2021-07-04 LAB — GLUCOSE, CAPILLARY
Glucose-Capillary: >600 mg/dL (ref 70–99)
Glucose-Capillary: 363 mg/dL — ABNORMAL HIGH (ref 70–99)

## 2021-07-04 SURGERY — SIGMOIDOSCOPY, FLEXIBLE
Anesthesia: General

## 2021-07-04 MED ORDER — SODIUM CHLORIDE 0.9 % IV SOLN: INTRAVENOUS | Status: DC

## 2021-07-04 MED ORDER — INSULIN ASPART 100 UNIT/ML IJ SOLN
INTRAMUSCULAR | Status: AC
  Filled 2021-07-04: qty 1

## 2021-07-04 MED ORDER — INSULIN ASPART 100 UNIT/ML IJ SOLN
10.0000 [IU] | Freq: Once | INTRAMUSCULAR | Status: AC
  Administered 2021-07-04: 10 [IU] via SUBCUTANEOUS

## 2021-07-04 NOTE — OR Nursing (Signed)
FSBS READS "HI" X2 ON GLUCOMETER. DR ZAK NOTIFIED . MD ORDERS STAT MET B. DR BYRNETT NOTIFIED. PT EXPERIENCING THIRST.LAB NOTIFIED . ?

## 2021-07-04 NOTE — Progress Notes (Signed)
Very elevated BS on admission, confirmed on lab testing.  ?Procedure cancelled. ?BS management per anesthesia.  ?

## 2021-07-04 NOTE — Progress Notes (Signed)
Per BMET, blood sugar is 597.  Dr Lemar Livings and Dr Suzan Slick notified, procedure cancelled at this time.  Dr Suzan Slick to discuss with Dr Ane Payment [facility]) regarding elevated glucose and his recommendations. ?After speaking with Dr Ane Payment, Dr Suzan Slick ordered Novolog 10u subcutaneous x 1 and recheck blood glucose in 30 minutes.  Discharge/ further instructions pending glucose results at that time.  Tolerating clear liquids at this time. ?

## 2021-07-04 NOTE — Progress Notes (Signed)
Patient with undetectably high glucose on glucometer preop. Only on oral medication normally, patient does not check his sugar routinely at home. Only symptom is mild thirst which he attributes to doing his colonoscopy prep. Denies confusion, nausea, vomiting, diaphoresis.  ? ?BMP showed glucose of 597. After confirmation of lack of symptoms and no signs of acidosis on BMP, will administer 10units of subq insulin, administer at least 1L fluid and encourage PO intake, and recheck glucose. If trending in the right direction at least, will discharge. I stressed to the patient that he needs close follow up with his PCP to evaluate proper diabetic control. Patient is in agreement with this plan. ?

## 2021-07-04 NOTE — Progress Notes (Signed)
Patient for Flexible Sigmoidoscopy with Dr Bary Castilla today, but patient's blood sugar >600 x 2 per glucometer.  Dr Bary Castilla and Dr Tollie Pizza made aware, BMET ordered and procedure on hold pending BMET results. ?

## 2021-07-04 NOTE — OR Nursing (Signed)
Blood sugar 363 after insulin.Dr Suzan Slick stated to discharge patient to the care of his physcian for blood sugar control. ?

## 2021-07-04 NOTE — H&P (Signed)
Timothy Cameron ?[id number] ?August 03, 1965 ? ?  ? ?HPI: 56 y/o diabetic s/p sigmoid resection, anastomosis and diverting loop ileostomy s/p gastrograffin enema suggesting stricture.  For sigmoidoscopy. ?Presented w/ BS> 600. No BS> 300 during hospitalization.  ?Awaiting lab confirmation.  ? ?Medications Prior to Admission  ?Medication Sig Dispense Refill Last Dose  ? losartan (COZAAR) 50 MG tablet Take 50 mg by mouth at bedtime.   07/02/2021  ? atorvastatin (LIPITOR) 20 MG tablet Take 20 mg by mouth at bedtime.   07/02/2021  ? glipiZIDE (GLUCOTROL XL) 10 MG 24 hr tablet Take 10 mg by mouth at bedtime.   07/02/2021  ? hydrochlorothiazide (HYDRODIURIL) 25 MG tablet Take 25 mg by mouth at bedtime.   07/02/2021  ? ibuprofen (ADVIL) 200 MG tablet Take 800 mg by mouth as needed.     ? Multiple Vitamin (MULTIVITAMIN WITH MINERALS) TABS tablet Take 1 tablet by mouth daily.   07/02/2021  ? omeprazole (PRILOSEC) 20 MG capsule Take 20 mg by mouth at bedtime.   07/02/2021  ? sertraline (ZOLOFT) 25 MG tablet Take 25 mg by mouth daily.   07/02/2021  ? ?No Known Allergies ?Past Medical History:  ?Diagnosis Date  ? Atelectasis, left   ? Bacteremia due to Gram-negative bacteria   ? Diabetes (HCC)   ? Diverticulitis   ? GI bleed   ? High cholesterol   ? Hypertension   ? Pneumonia   ? due to covid 19  ? Sleep apnea   ? Thrombocytopenia (HCC)   ? ?Past Surgical History:  ?Procedure Laterality Date  ? ABDOMINAL SURGERY    ? APPENDECTOMY  04/26/2021  ? Procedure: APPENDECTOMY;  Surgeon: Carolan Shiver, MD;  Location: [facility] ORS;  Service: General;;  ? COLON RESECTION SIGMOID  04/26/2021  ? Procedure: partial colectomy with anastomosis;  Surgeon: Carolan Shiver, MD;  Location: [facility] ORS;  Service: General;;  ? COLONOSCOPY N/A 03/09/2016  ? Procedure: COLONOSCOPY;  Surgeon: Charlott Rakes, MD;  Location: [facility] ENDOSCOPY;  Service: Endoscopy;  Laterality: N/A;  ? ILEOSTOMY  04/26/2021  ? Procedure: ILEOSTOMY Creation;  Surgeon: Carolan Shiver, MD;  Location: [facility] ORS;  Service: General;;  ? INCISIONAL HERNIA REPAIR  04/26/2021  ? Procedure: HERNIA REPAIR INCISIONAL;  Surgeon: Carolan Shiver, MD;  Location: [facility] ORS;  Service: General;;  ? ?Social History  ? ?Socioeconomic History  ? Marital status: Single  ?  Spouse name: Not on file  ? Number of children: Not on file  ? Years of education: Not on file  ? Highest education level: Not on file  ?Occupational History  ? Not on file  ?Tobacco Use  ? Smoking status: Every Day  ?  Types: Cigars, Cigarettes  ?  Last attempt to quit: 10/23/2017  ?  Years since quitting: 3.6  ? Smokeless tobacco: Never  ?Vaping Use  ? Vaping Use: Never used  ?Substance and Sexual Activity  ? Alcohol use: Yes  ?  Alcohol/week: 30.0 standard drinks  ?  Types: 30 Cans of beer per week  ?  Comment: 4-5 beers every night  ? Drug use: Yes  ?  Types: Marijuana, Cocaine  ?  Comment: none last 49yrs cocaine use  ? Sexual activity: Not on file  ?Other Topics Concern  ? Not on file  ?Social History Narrative  ? Not on file  ? ?Social Determinants of Health  ? ?Financial Resource Strain: Not on file  ?Food Insecurity: Not on file  ?Transportation Needs: Not on file  ?Physical  Activity: Not on file  ?Stress: Not on file  ?Social Connections: Not on file  ?Intimate Partner Violence: Not on file  ? ?Social History  ? ?Social History Narrative  ? Not on file  ? ? ? ?ROS: Negative.  ? ? ? ?PE: ?HEENT: Negative. ?Lungs: Clear. ?Cardio: RR. ? ?Assessment/Plan: ? ?Proceed with planned endoscopy if BS appropriate. ? ?  ?Timothy Cameron ?07/04/2021 ? ? ?  ?

## 2021-07-05 ENCOUNTER — Other Ambulatory Visit: Payer: 59

## 2021-07-05 ENCOUNTER — Inpatient Hospital Stay: Admission: RE | Admit: 2021-07-05 | Payer: 59 | Source: Ambulatory Visit

## 2021-07-05 DIAGNOSIS — Z03818 Encounter for observation for suspected exposure to other biological agents ruled out: Secondary | ICD-10-CM | POA: Diagnosis not present

## 2021-07-05 DIAGNOSIS — E785 Hyperlipidemia, unspecified: Secondary | ICD-10-CM | POA: Diagnosis not present

## 2021-07-05 DIAGNOSIS — J029 Acute pharyngitis, unspecified: Secondary | ICD-10-CM | POA: Diagnosis not present

## 2021-07-05 DIAGNOSIS — J1282 Pneumonia due to coronavirus disease 2019: Secondary | ICD-10-CM | POA: Diagnosis not present

## 2021-07-05 DIAGNOSIS — E1169 Type 2 diabetes mellitus with other specified complication: Secondary | ICD-10-CM | POA: Diagnosis not present

## 2021-07-05 DIAGNOSIS — U071 COVID-19: Secondary | ICD-10-CM | POA: Diagnosis not present

## 2021-07-05 DIAGNOSIS — J189 Pneumonia, unspecified organism: Secondary | ICD-10-CM | POA: Diagnosis not present

## 2021-07-06 LAB — GLUCOSE, CAPILLARY: Glucose-Capillary: >600 mg/dL (ref 70–99)

## 2021-07-09 ENCOUNTER — Inpatient Hospital Stay: Admission: RE | Admit: 2021-07-09 | Payer: 59 | Source: Home / Self Care | Admitting: General Surgery

## 2021-07-09 ENCOUNTER — Encounter: Admission: RE | Payer: Self-pay | Source: Home / Self Care

## 2021-07-09 SURGERY — CLOSURE, ILEOSTOMY
Anesthesia: General | Site: Abdomen

## 2021-07-12 DIAGNOSIS — J029 Acute pharyngitis, unspecified: Secondary | ICD-10-CM | POA: Diagnosis not present

## 2021-07-12 DIAGNOSIS — E1169 Type 2 diabetes mellitus with other specified complication: Secondary | ICD-10-CM | POA: Diagnosis not present

## 2021-07-12 DIAGNOSIS — E785 Hyperlipidemia, unspecified: Secondary | ICD-10-CM | POA: Diagnosis not present

## 2021-07-12 DIAGNOSIS — H6692 Otitis media, unspecified, left ear: Secondary | ICD-10-CM | POA: Diagnosis not present

## 2021-07-17 DIAGNOSIS — I159 Secondary hypertension, unspecified: Secondary | ICD-10-CM | POA: Diagnosis not present

## 2021-07-17 DIAGNOSIS — E785 Hyperlipidemia, unspecified: Secondary | ICD-10-CM | POA: Diagnosis not present

## 2021-07-17 DIAGNOSIS — E1169 Type 2 diabetes mellitus with other specified complication: Secondary | ICD-10-CM | POA: Diagnosis not present

## 2021-07-18 ENCOUNTER — Other Ambulatory Visit: Payer: Self-pay | Admitting: General Surgery

## 2021-07-18 NOTE — Progress Notes (Signed)
Original procedure canceled several weeks ago because of an elevated blood sugar.  Now under better control.  Clinical history has not changed. ? ?July 17, 2021 laboratory: ? ? ?Component Ref Range & Units 1 d ago  ?Glucose 70 - 110 mg/dL 170 High    ?Sodium 136 - 145 mmol/L 135 Low    ?Potassium 3.6 - 5.1 mmol/L 4.0   ?Chloride 97 - 109 mmol/L 103   ?Carbon Dioxide (CO2) 22.0 - 32.0 mmol/L 24.1   ?Urea Nitrogen (BUN) 7 - 25 mg/dL 27 High    ?Creatinine 0.7 - 1.3 mg/dL 0.9   ?Glomerular Filtration Rate (eGFR), MDRD Estimate >60 mL/min/1.73sq m 87   ?Calcium 8.7 - 10.3 mg/dL 9.8   ?AST  8 - 39 U/L 17   ?ALT  6 - 57 U/L 32   ?Alk Phos (alkaline Phosphatase) 34 - 104 U/L 61   ?Albumin 3.5 - 4.8 g/dL 4.5   ?Bilirubin, Total 0.3 - 1.2 mg/dL 0.4   ?Protein, Total 6.1 - 7.9 g/dL 7.5   ?A/G Ratio 1.0 - 5.0 gm/dL 1.5   ? ?WBC (White Blood Cell Count) 4.1 - 10.2 10?3/uL 10.6 High    ?RBC (Red Blood Cell Count) 4.69 - 6.13 10?6/uL 5.05   ?Hemoglobin 14.1 - 18.1 gm/dL 15.7   ?Hematocrit 40.0 - 52.0 % 44.0   ?MCV (Mean Corpuscular Volume) 80.0 - 100.0 fl 87.1   ?MCH (Mean Corpuscular Hemoglobin) 27.0 - 31.2 pg 31.1   ?MCHC (Mean Corpuscular Hemoglobin Concentration) 32.0 - 36.0 gm/dL 35.7   ?Platelet Count 150 - 450 10?3/uL 249   ?RDW-CV (Red Cell Distribution Width) 11.6 - 14.8 % 12.0   ?MPV (Mean Platelet Volume) 9.4 - 12.4 fl 10.0   ?Neutrophils 1.50 - 7.80 10?3/uL 6.61   ?Lymphocytes 1.00 - 3.60 10?3/uL 2.66   ?Monocytes 0.00 - 1.50 10?3/uL 0.75   ?Eosinophils 0.00 - 0.55 10?3/uL 0.38   ?Basophils 0.00 - 0.09 10?3/uL 0.13 High    ?Neutrophil % 32.0 - 70.0 % 62.3   ?Lymphocyte % 10.0 - 50.0 % 25.1   ?Monocyte % 4.0 - 13.0 % 7.1   ?Eosinophil % 1.0 - 5.0 % 3.6   ?Basophil% 0.0 - 2.0 % 1.2   ?Immature Granulocyte % <=0.7 % 0.7   ?Immature Granulocyte Count <=0.06 10^3/?L 0.07 High    ? ? ? ? ?Subjective:  ?  ? Patient ID: Timothy Cameron is a 56 y.o. male. ?  ?HPI ?  ?The following portions of the patient's history were  reviewed and updated as appropriate. ?  ?This a new patient is here today for: office visit. Patient was referred by Dr. Peyton Najjar for evaluation of flexible sigmoidoscopy. ?  ?  ?   ?Chief Complaint  ?Patient presents with  ? Pre-op Exam  ?  ?  ?BP (!) 168/88   Pulse 88   Ht 180.3 cm ($RemoveB'5\' 11"'ySmHadGz$ )   Wt 95.7 kg (211 lb)   SpO2 97%   BMI 29.43 kg/m?  ?  ?    ?Past Medical History:  ?Diagnosis Date  ? Diabetes mellitus type 2, controlled (CMS-HCC)    ? HLD (hyperlipidemia)    ? HTN (hypertension)    ?  ?  ?     ?Past Surgical History:  ?Procedure Laterality Date  ? APPENDECTOMY   04/26/2021  ?  Dr Lesli Albee  ? 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  Dr. Herbert Pun  ? colon surgery      ?  before 2006 --- [facility]  ?  ?  ?  ?  ?Social History  ?  ?  ?     ?Socioeconomic History  ? Marital status: Single  ?Tobacco Use  ? Smoking status: Former  ?    Types: Cigarettes  ?    Quit date: 04/26/2021  ?    Years since quitting: 0.1  ? Smokeless tobacco: Never  ?Vaping Use  ? Vaping Use: Never used  ?Substance and Sexual Activity  ? Alcohol use: Yes  ?    Comment: occasionally  ? Drug use: Not Currently  ?    Comment: prior to 2017 at least  ? Sexual activity: Yes  ?  ?  ?  ?No Known Allergies ?  ?Current Medications  ?      ?Current Outpatient Medications  ?Medication Sig Dispense Refill  ? atorvastatin (LIPITOR) 20 MG tablet Take 1 tablet (20 mg total) by mouth at bedtime 90 tablet 2  ? glipiZIDE (GLUCOTROL XL) 10 MG XL tablet Take 1 tablet (10 mg total) by mouth once daily 90 tablet 2  ? hydroCHLOROthiazide (HYDRODIURIL) 25 MG tablet Take 1 tablet (25 mg total) by mouth once daily 90 tablet 2  ? losartan (COZAAR) 50 MG tablet Take 1 tablet (50 mg total) by mouth once daily 90 tablet 2  ? omeprazole (PRILOSEC) 20 MG DR capsule Take 1 capsule (20 mg total) by mouth once daily 90 capsule 2  ? psyllium husk, bulk, 100 % Powd Take by mouth at bedtime      ? sennosides-docusate  (SENOKOT-S) 8.6-50 mg tablet Take 3 tablets by mouth at bedtime      ?  ?No current facility-administered medications for this visit.  ?  ?  ?  ?     ?Family History  ?Problem Relation Age of Onset  ? Diabetes Mother    ? High blood pressure (Hypertension) Mother    ? Irregular Heart Beat (Arrhythmia) Mother    ? Diabetes Father    ? High blood pressure (Hypertension) Father    ? COPD Brother    ? Diabetes Brother    ? Hyperlipidemia (Elevated cholesterol) Brother    ? High blood pressure (Hypertension) Brother    ? Obesity Brother    ?  ?  ?  ?Labs and Radiology:  ?  ?April 26, 2021 pathology: ?  ?DIAGNOSIS:  ?A. APPENDIX; APPENDECTOMY:  ?- APPENDIX WITH MILD ACUTE SEROSITIS.  ?- OBLITERATION OF THE DISTAL LUMEN.  ?- NEGATIVE FOR MALIGNANCY.  ?  ?B.  COLON, SIGMOID; RESECTION:  ?- ISCHEMIC COLITIS WITH TRANSMURAL NECROSIS AND SUPPURATIVE SEROSITIS  ?CONSISTENT WITH PERFORATION.  ?- DIVERTICULOSIS.  ?- UNDESIGNATED MARGIN WITH FOCAL MUCOSAL ISCHEMIA AND SUBMUCOSAL  ?INFLAMMATION.  ?- NEGATIVE FOR DYSPLASIA AND MALIGNANCY.  ?  ?June 07, 2021 contrast study: ?  ?IMPRESSION: ?Caliber change at the distal colonic anastomosis decreases over the ?course of the exam. By the end of the exam, the colon immediately ?proximal to the anastomosis is similar in caliber to the descending ?colon. There is no abrupt or tight stricture evident. No contrast ?extravasation at the anastomosis. ?  ?Contrast material was refluxed to the level of the distal ileum with ?no gross colonic abnormality evident. ?  ?These films have been independently reviewed. ?  ?CT of the abdomen pelvis dated April 25, 2021: ?  ?This study was independently reviewed.  The celiac, SMA and IMA vessels all appear patent. ?  ?Review of Systems  ?Constitutional: Negative  for chills and fever.  ?Respiratory: Negative for cough.   ?  ?  ?   ?Objective:  ? Physical Exam ?Constitutional:   ?   Appearance: Normal appearance.  ?Cardiovascular:  ?   Rate and  Rhythm: Normal rate and regular rhythm.  ?   Pulses: Normal pulses.  ?   Heart sounds: Normal heart sounds.  ?Pulmonary:  ?   Effort: Pulmonary effort is normal.  ?   Breath sounds: Normal breath sounds.  ?Musculoskeletal:  ?   Cervical back: Neck supple.  ?Neurological:  ?   Mental Status: He is alert and oriented to person, place, and time.  ?Psychiatric:     ?   Mood and Affect: Mood normal.     ?   Behavior: Behavior normal.  ?  ?  ?  ?   ?Assessment:  ?   ?Slight narrowing at the anastomosis on recent contrast study, possible spasm versus related to ischemia. ?   ?Plan:  ?   ?Indications for endoscopic view was reviewed with the patient.  With his ability ostomy for multiple prep" is not possible.  We will have the patient use of 2, 1 L tapwater enemas prior to the procedure.  He reports being familiar with enemas. ?  ?Patient will be scheduled for a flexible sigmoidoscopy with sedation for next Wednesday, 07-04-21 at [facility]. Instructions have been provided by the staff today. Patient has been asked to make use of two tap water enemas one two hours prior and the other one hour prior to his arrival time day of procedure. He is aware no solids after midnight but may have clear liquids up until 3 hours prior. Patient also aware to have a driver the day of the procedure.  ?   ?This note is partially prepared by Ledell Noss, CMA acting as a scribe in the presence of Dr. Hervey Ard, MD.  ?  ?The documentation recorded by the scribe accurately reflects the service I personally performed and the decisions made by me.  ?  ?Robert Bellow, MD FACS ?  ?

## 2021-07-24 ENCOUNTER — Encounter: Payer: Self-pay | Admitting: General Surgery

## 2021-07-25 ENCOUNTER — Ambulatory Visit: Payer: 59 | Admitting: Anesthesiology

## 2021-07-25 ENCOUNTER — Encounter
Admission: RE | Disposition: A | Discharge status: Home / Self Care | Payer: Self-pay | Source: Home / Self Care | Attending: General Surgery

## 2021-07-25 ENCOUNTER — Ambulatory Visit
Admission: RE | Admit: 2021-07-25 | Discharge: 2021-07-25 | Disposition: A | Discharge status: Home / Self Care | Payer: 59 | Attending: General Surgery | Admitting: General Surgery

## 2021-07-25 ENCOUNTER — Encounter: Payer: Self-pay | Admitting: General Surgery

## 2021-07-25 DIAGNOSIS — Z932 Ileostomy status: Secondary | ICD-10-CM | POA: Diagnosis not present

## 2021-07-25 DIAGNOSIS — Z9049 Acquired absence of other specified parts of digestive tract: Secondary | ICD-10-CM | POA: Diagnosis not present

## 2021-07-25 DIAGNOSIS — Z980 Intestinal bypass and anastomosis status: Secondary | ICD-10-CM | POA: Diagnosis not present

## 2021-07-25 DIAGNOSIS — K9189 Other postprocedural complications and disorders of digestive system: Secondary | ICD-10-CM | POA: Diagnosis not present

## 2021-07-25 DIAGNOSIS — R933 Abnormal findings on diagnostic imaging of other parts of digestive tract: Secondary | ICD-10-CM | POA: Diagnosis not present

## 2021-07-25 DIAGNOSIS — Z8719 Personal history of other diseases of the digestive system: Secondary | ICD-10-CM | POA: Diagnosis not present

## 2021-07-25 DIAGNOSIS — E119 Type 2 diabetes mellitus without complications: Secondary | ICD-10-CM | POA: Insufficient documentation

## 2021-07-25 DIAGNOSIS — K6389 Other specified diseases of intestine: Secondary | ICD-10-CM | POA: Diagnosis not present

## 2021-07-25 DIAGNOSIS — Z09 Encounter for follow-up examination after completed treatment for conditions other than malignant neoplasm: Secondary | ICD-10-CM | POA: Diagnosis not present

## 2021-07-25 DIAGNOSIS — I10 Essential (primary) hypertension: Secondary | ICD-10-CM | POA: Insufficient documentation

## 2021-07-25 DIAGNOSIS — G4730 Sleep apnea, unspecified: Secondary | ICD-10-CM | POA: Insufficient documentation

## 2021-07-25 DIAGNOSIS — K631 Perforation of intestine (nontraumatic): Secondary | ICD-10-CM | POA: Diagnosis not present

## 2021-07-25 HISTORY — PX: FLEXIBLE SIGMOIDOSCOPY: SHX5431

## 2021-07-25 LAB — GLUCOSE, CAPILLARY: Glucose-Capillary: 126 mg/dL — ABNORMAL HIGH (ref 70–99)

## 2021-07-25 SURGERY — SIGMOIDOSCOPY, FLEXIBLE
Anesthesia: General

## 2021-07-25 MED ORDER — LIDOCAINE HCL (CARDIAC) PF 100 MG/5ML IV SOSY
PREFILLED_SYRINGE | INTRAVENOUS | Status: DC | PRN
  Administered 2021-07-25: 100 mg via INTRAVENOUS

## 2021-07-25 MED ORDER — SODIUM CHLORIDE 0.9 % IV SOLN
INTRAVENOUS | Status: DC
Start: 2021-07-25 — End: 2021-07-25
  Administered 2021-07-25: 20 mL/h via INTRAVENOUS

## 2021-07-25 MED ORDER — PROPOFOL 10 MG/ML IV BOLUS
INTRAVENOUS | Status: AC
  Filled 2021-07-25: qty 20

## 2021-07-25 MED ORDER — PROPOFOL 10 MG/ML IV BOLUS
INTRAVENOUS | Status: DC | PRN
  Administered 2021-07-25: 30 mg via INTRAVENOUS
  Administered 2021-07-25: 130 mg via INTRAVENOUS
  Administered 2021-07-25 (×3): 30 mg via INTRAVENOUS
  Administered 2021-07-25: 50 mg via INTRAVENOUS

## 2021-07-25 NOTE — Transfer of Care (Signed)
Immediate Anesthesia Transfer of Care Note ? ?Patient: Timothy Cameron ? ?Procedure(s) Performed: FLEXIBLE SIGMOIDOSCOPY ? ?Patient Location: Endoscopy Unit ? ?Anesthesia Type:General ? ?Level of Consciousness: awake, alert  and oriented ? ?Airway & Oxygen Therapy: Patient Spontanous Breathing and Patient connected to face mask oxygen ? ?Post-op Assessment: Report given to RN and Post -op Vital signs reviewed and stable ? ?Post vital signs: Reviewed and stable ? ?Last Vitals:  ?Vitals Value Taken Time  ?BP 142/91 07/25/21 1205  ?Temp 35.7 ?C 07/25/21 1205  ?Pulse 77 07/25/21 1205  ?Resp 12 07/25/21 1205  ?SpO2 100 % 07/25/21 1205  ? ? ?Last Pain:  ?Vitals:  ? 07/25/21 1205  ?TempSrc: Temporal  ?PainSc: 0-No pain  ?   ? ?  ? ?Complications: No notable events documented. ?

## 2021-07-25 NOTE — Op Note (Signed)
[facility] ?Gastroenterology ?Patient Name: Christine Schweitzer ?Procedure Date: 07/25/2021 11:34 AM ?MRN: [id number] ?Account #: [id number] ?Date of Birth: 05-30-1965 ?Admit Type: Outpatient ?Age: 56 ?Room: [facility] ENDO ROOM 1 ?Gender: Male ?Note Status: Finalized ?Instrument Name: Peds Colonoscope [id number] ?Procedure:             Flexible Sigmoidoscopy ?Indications:           Abnormal barium enema ?Providers:             Robert Bellow, MD ?Referring MD:          Theotis Burrow (Referring MD) ?Medicines:             Propofol per Anesthesia ?Complications:         No immediate complications. ?Procedure:             Pre-Anesthesia Assessment: ?                       - Prior to the procedure, a History and Physical was  ?                       performed, and patient medications, allergies and  ?                       sensitivities were reviewed. The patient's tolerance  ?                       of previous anesthesia was reviewed. ?                       - The risks and benefits of the procedure and the  ?                       sedation options and risks were discussed with the  ?                       patient. All questions were answered and informed  ?                       consent was obtained. ?                       After obtaining informed consent, the scope was passed  ?                       under direct vision. The Colonoscope was introduced  ?                       through the anus and advanced to the the descending  ?                       colon. The flexible sigmoidoscopy was accomplished  ?                       without difficulty. The patient tolerated the  ?                       procedure well. The quality of the bowel preparation  ?  was excellent. ?Findings: ?     There was evidence of a prior end-to-end colo-colonic anastomosis in the  ?     recto-sigmoid colon. This was patent and was characterized by  ?     inflammation. The anastomosis was traversed.  Lavage of the area was  ?     performed using a moderate amount, resulting in clearance with excellent  ?     visualization. ?     There were two pedunculated areas of suspected granulation tissue that  ?     were seen. One separated with irrigation, the second with snare  ?     application. No bleeding. ?     The perianal and digital rectal examinations were normal. Pertinent  ?     negatives include normal sphincter tone. ?Impression:            - Patent end-to-end colo-colonic anastomosis,  ?                       characterized by inflammation. ?                       - No specimens collected. ?Recommendation:        - Telephone endoscopist for pathology results in 1  ?                       week. ?Procedure Code(s):     --- Professional --- ?                       [phone], Sigmoidoscopy, flexible; diagnostic, including  ?                       collection of specimen(s) by brushing or washing, when  ?                       performed (separate procedure) ?Diagnosis Code(s):     --- Professional --- ?                       Z98.0, Intestinal bypass and anastomosis status ?                       R93.3, Abnormal findings on diagnostic imaging of  ?                       other parts of digestive tract ?CPT copyright 2019 [employer] Association. All rights reserved. ?The codes documented in this report are preliminary and upon coder review may  ?be revised to meet current compliance requirements. ?Robert Bellow, MD ?07/25/2021 12:05:31 PM ?This report has been signed electronically. ?Number of Addenda: 0 ?Note Initiated On: 07/25/2021 11:34 AM ?Total Procedure Duration: 0 hours 13 minutes 50 seconds  ?Estimated Blood Loss:  Estimated blood loss: none. ?     [facility] ?

## 2021-07-25 NOTE — H&P (Signed)
OSTEN JANEK ?[id number] ?1966-03-14 ? ?  ? ?HPI: 56 y/o male s/p sigmoid colectomy for perforation secondary to ischemic colitis.  Post procedure gastrografin study suggested spasm vs stricture at the anastomosis.  Endoscopy to day to assess.  ? ?Medications Prior to Admission  ?Medication Sig Dispense Refill Last Dose  ? atorvastatin (LIPITOR) 20 MG tablet Take 20 mg by mouth at bedtime.   Past Week  ? glipiZIDE (GLUCOTROL XL) 10 MG 24 hr tablet Take 10 mg by mouth in the morning and at bedtime.   07/25/2021 at 0800  ? hydrochlorothiazide (HYDRODIURIL) 25 MG tablet Take 25 mg by mouth at bedtime.   Past Week  ? ibuprofen (ADVIL) 200 MG tablet Take 800 mg by mouth every 8 (eight) hours as needed for moderate pain.   07/25/2021 at 0800  ? losartan (COZAAR) 50 MG tablet Take 50 mg by mouth at bedtime.   Past Week  ? Multiple Vitamin (MULTIVITAMIN WITH MINERALS) TABS tablet Take 2 tablets by mouth daily.   Past Week  ? omeprazole (PRILOSEC) 20 MG capsule Take 20 mg by mouth at bedtime.   Past Week  ? ?No Known Allergies ?Past Medical History:  ?Diagnosis Date  ? Atelectasis, left   ? Bacteremia due to Gram-negative bacteria   ? Diabetes (HCC)   ? Diverticulitis   ? GI bleed   ? High cholesterol   ? Hypertension   ? Pneumonia   ? due to covid 19  ? Sleep apnea   ? Thrombocytopenia (HCC)   ? ?Past Surgical History:  ?Procedure Laterality Date  ? ABDOMINAL SURGERY    ? APPENDECTOMY  04/26/2021  ? Procedure: APPENDECTOMY;  Surgeon: Carolan Shiver, MD;  Location: [facility] ORS;  Service: General;;  ? COLON RESECTION SIGMOID  04/26/2021  ? Procedure: partial colectomy with anastomosis;  Surgeon: Carolan Shiver, MD;  Location: [facility] ORS;  Service: General;;  ? COLONOSCOPY N/A 03/09/2016  ? Procedure: COLONOSCOPY;  Surgeon: Charlott Rakes, MD;  Location: [facility] ENDOSCOPY;  Service: Endoscopy;  Laterality: N/A;  ? ILEOSTOMY  04/26/2021  ? Procedure: ILEOSTOMY Creation;  Surgeon: Carolan Shiver, MD;  Location: [facility] ORS;   Service: General;;  ? INCISIONAL HERNIA REPAIR  04/26/2021  ? Procedure: HERNIA REPAIR INCISIONAL;  Surgeon: Carolan Shiver, MD;  Location: [facility] ORS;  Service: General;;  ? ?Social History  ? ?Socioeconomic History  ? Marital status: Single  ?  Spouse name: Not on file  ? Number of children: Not on file  ? Years of education: Not on file  ? Highest education level: Not on file  ?Occupational History  ? Not on file  ?Tobacco Use  ? Smoking status: Former  ?  Types: Cigars, Cigarettes  ?  Quit date: 04/26/2021  ?  Years since quitting: 0.2  ? Smokeless tobacco: Never  ?Vaping Use  ? Vaping Use: Never used  ?Substance and Sexual Activity  ? Alcohol use: Not Currently  ?  Comment: occ  ? Drug use: Yes  ?  Types: Marijuana, Cocaine  ?  Comment: none last 89yrs cocaine use; none since 2017  ? Sexual activity: Not on file  ?Other Topics Concern  ? Not on file  ?Social History Narrative  ? Not on file  ? ?Social Determinants of Health  ? ?Financial Resource Strain: Not on file  ?Food Insecurity: Not on file  ?Transportation Needs: Not on file  ?Physical Activity: Not on file  ?Stress: Not on file  ?Social Connections: Not on file  ?Intimate  Partner Violence: Not on file  ? ?Social History  ? ?Social History Narrative  ? Not on file  ? ? ? ?ROS: Negative.  ? ? ? ?PE: ?HEENT: Negative. ?Lungs: Clear. ?Cardio: RR. ? ? ?Assessment/Plan: ? ?Proceed with planned endoscopy.  ?Earline Mayotte ?07/25/2021 ? ?  ?

## 2021-07-25 NOTE — Anesthesia Postprocedure Evaluation (Signed)
Anesthesia Post Note ? ?Patient: Timothy Cameron ? ?Procedure(s) Performed: FLEXIBLE SIGMOIDOSCOPY ? ?Patient location during evaluation: Endoscopy ?Anesthesia Type: General ?Level of consciousness: awake and alert ?Pain management: pain level controlled ?Vital Signs Assessment: post-procedure vital signs reviewed and stable ?Respiratory status: spontaneous breathing, nonlabored ventilation, respiratory function stable and patient connected to nasal cannula oxygen ?Cardiovascular status: blood pressure returned to baseline and stable ?Postop Assessment: no apparent nausea or vomiting ?Anesthetic complications: no ? ? ?No notable events documented. ? ? ?Last Vitals:  ?Vitals:  ? 07/25/21 1205 07/25/21 1225  ?BP: (!) 142/91 (!) 140/99  ?Pulse: 77 78  ?Resp: 12   ?Temp: (!) 35.7 ?C   ?SpO2: 100% 98%  ?  ?Last Pain:  ?Vitals:  ? 07/25/21 1225  ?TempSrc:   ?PainSc: 0-No pain  ? ? ?  ?  ?  ?  ?  ?  ? ?Corinda Gubler ? ? ? ? ?

## 2021-07-25 NOTE — Anesthesia Preprocedure Evaluation (Signed)
Anesthesia Evaluation  ?Patient identified by MRN, date of birth, ID band ?Patient awake ? ? ? ?Reviewed: ?Allergy & Precautions, NPO status , Patient's Chart, lab work & pertinent test results ? ?History of Anesthesia Complications ?Negative for: history of anesthetic complications ? ?Airway ?Mallampati: II ? ?TM Distance: >3 FB ?Neck ROM: Full ? ? ? Dental ?no notable dental hx. ?(+) Chipped ?  ?Pulmonary ?shortness of breath, sleep apnea , neg COPD, neg recent URI, Current SmokerPatient did not abstain from smoking.,  ?  ?Pulmonary exam normal ?breath sounds clear to auscultation ? ? ? ? ? ? Cardiovascular ?Exercise Tolerance: Good ?hypertension, Pt. on medications ?(-) angina(-) Past MI and (-) Cardiac Stents Normal cardiovascular exam(-) dysrhythmias (-) Valvular Problems/Murmurs ?Rhythm:Regular Rate:Normal ?- Systolic murmurs ? ?  ?Neuro/Psych ?negative neurological ROS ? negative psych ROS  ? GI/Hepatic ?(+)  ?  ? substance abuse (history of cocaine abuse, last UDS was positive) ? marijuana use, Cocaine use over 3 years ago. Denies any current use ?Lower GI bleed ?  ?Endo/Other  ?diabetes ? Renal/GU ?negative Renal ROS  ?negative genitourinary ?  ?Musculoskeletal ?negative musculoskeletal ROS ?(+)  ? Abdominal ?Normal abdominal exam  (+)   ?Peds ?negative pediatric ROS ?(+)  Hematology ?negative hematology ROS ?(+)   ?Anesthesia Other Findings ?Past Medical History: ?No date: Diabetes [facility]) ?No date: High cholesterol ?No date: Hypertension ? ? ? Reproductive/Obstetrics ? ?  ? ? ? ? ? ? ? ? ? ? ? ? ? ?  ?  ? ? ? ? ? ? ? ? ?Anesthesia Physical ? ?Anesthesia Plan ? ?ASA: 2 ? ?Anesthesia Plan: General  ? ?Post-op Pain Management: Minimal or no pain anticipated  ? ?Induction: Intravenous ? ?PONV Risk Score and Plan: 1 and Propofol infusion, TIVA and Ondansetron ? ?Airway Management Planned: Natural Airway ? ?Additional Equipment: None ? ?Intra-op Plan:  ? ?Post-operative Plan:  Extubation in OR ? ?Informed Consent: I have reviewed the patients History and Physical, chart, labs and discussed the procedure including the risks, benefits and alternatives for the proposed anesthesia with the patient or authorized representative who has indicated his/her understanding and acceptance.  ? ? ? ?Dental advisory given ? ?Plan Discussed with: CRNA and Surgeon ? ?Anesthesia Plan Comments: (Discussed risks of anesthesia with patient, including possibility of difficulty with spontaneous ventilation under anesthesia necessitating airway intervention, PONV, and rare risks such as cardiac or respiratory or neurological events, and allergic reactions. Discussed the role of CRNA in patient's perioperative care. Patient understands. ?Patient counseled on benefits of smoking cessation, and increased perioperative risks associated with continued smoking. ?)  ? ? ? ? ? ? ?Anesthesia Quick Evaluation ? ?

## 2021-07-26 LAB — SURGICAL PATHOLOGY

## 2021-07-27 ENCOUNTER — Ambulatory Visit: Payer: Self-pay | Admitting: General Surgery

## 2021-07-27 NOTE — H&P (View-Only) (Signed)
HISTORY OF PRESENT ILLNESS:  ?  ?Timothy Cameron is a 56 y.o.male patient who comes for preoperative evaluation for ileostomy reversal. ?  ?Patient with history of perforated sigmoid colon due to ischemic colitis.  He underwent partial colectomy with anastomosis and loop ileostomy creation. ?  ?The patient has been recovering adequately.  He has been taking great care of the colostomy.  Patient is eating well.  No issues with the ileostomy bag.  Adequate skin care. ?  ?He had a Gastrografin enema imaging study that shows initial narrowing of the anastomosis with eventual resolution with adequate lumen of proximal and distal colon.  There was no other masses identified. ? ?Colonoscopy showed expected inflammatory changes on the anastomosis but adequate lumen.  ?   ?  ?PAST MEDICAL HISTORY:  ?       ?Past Medical History:  ?Diagnosis Date  ? Diabetes mellitus type 2, controlled (CMS-HCC)    ? HLD (hyperlipidemia)    ? HTN (hypertension)    ?  ?  ?  ?PAST SURGICAL HISTORY:   ?         ?Past Surgical History:  ?Procedure Laterality Date  ? APPENDECTOMY   04/26/2021  ?  Dr Arrie Senate  ? 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  Dr. Carolan Shiver  ? colon surgery      ?  before 2006 --- Kendell Bane  ?    ?   ?MEDICATIONS:  ?Encounter Medications  ?           ?Outpatient Encounter Medications as of 06/26/2021  ?Medication Sig Dispense Refill  ? atorvastatin (LIPITOR) 20 MG tablet Take 1 tablet (20 mg total) by mouth at bedtime 90 tablet 2  ? glipiZIDE (GLUCOTROL XL) 10 MG XL tablet Take 1 tablet (10 mg total) by mouth once daily 90 tablet 2  ? hydroCHLOROthiazide (HYDRODIURIL) 25 MG tablet Take 1 tablet (25 mg total) by mouth once daily 90 tablet 2  ? losartan (COZAAR) 50 MG tablet Take 1 tablet (50 mg total) by mouth once daily 90 tablet 2  ? omeprazole (PRILOSEC) 20 MG DR capsule Take 1 capsule (20 mg total) by mouth once daily 90 capsule 2  ? psyllium husk, bulk, 100 % Powd Take by  mouth at bedtime      ? sennosides-docusate (SENOKOT-S) 8.6-50 mg tablet Take 3 tablets by mouth at bedtime      ?  ?No facility-administered encounter medications on file as of 06/26/2021.  ?  ?  ?  ?ALLERGIES:   ?Patient has no known allergies. ?  ?SOCIAL HISTORY:  ?Social History  ?  ?  ?         ?Socioeconomic History  ? Marital status: Single  ?Tobacco Use  ? Smoking status: Former  ?    Types: Cigarettes  ?    Quit date: 04/26/2021  ?    Years since quitting: 0.1  ? Smokeless tobacco: Never  ?Vaping Use  ? Vaping Use: Never used  ?Substance and Sexual Activity  ? Alcohol use: Yes  ?    Comment: occasionally  ? Drug use: Not Currently  ?    Comment: prior to 2017 at least  ? Sexual activity: Yes  ?  ?  ?  ?FAMILY HISTORY:  ?         ?Family History  ?Problem Relation Age of Onset  ? Diabetes Mother    ? High blood pressure (Hypertension) Mother    ?  Irregular Heart Beat (Arrhythmia) Mother    ? Diabetes Father    ? High blood pressure (Hypertension) Father    ? COPD Brother    ? Diabetes Brother    ? Hyperlipidemia (Elevated cholesterol) Brother    ? High blood pressure (Hypertension) Brother    ? Obesity Brother    ?  ?  ?GENERAL REVIEW OF SYSTEMS:  ?  ?General ROS: negative for - chills, fatigue, fever, weight gain or weight loss ?Allergy and Immunology ROS: negative for - hives  ?Hematological and Lymphatic ROS: negative for - bleeding problems or bruising, negative for palpable nodes ?Endocrine ROS: negative for - heat or cold intolerance, hair changes ?Respiratory ROS: negative for - cough, shortness of breath or wheezing ?Cardiovascular ROS: no chest pain or palpitations ?GI ROS: negative for nausea, vomiting, abdominal pain, diarrhea, constipation ?Musculoskeletal ROS: negative for - joint swelling or muscle pain ?Neurological ROS: negative for - confusion, syncope ?Dermatological ROS: negative for pruritus and rash ?  ?PHYSICAL EXAM:  ?     ?Vitals:  ?  06/26/21 0958  ?BP: (!) 168/88  ?Pulse: 88  ?.   ?Ht:180.3 cm (5' 11") Wt:95.7 kg (211 lb) BSA:Body surface area is 2.19 meters squared. ?Body mass index is 29.43 kg/m?.. ?  ?GENERAL: Alert, active, oriented x3 ?  ?HEENT: Pupils equal reactive to light. Extraocular movements are intact. Sclera clear. Palpebral conjunctiva normal red color.Pharynx clear. ?  ?NECK: Supple with no palpable mass and no adenopathy. ?  ?LUNGS: Sound clear with no rales rhonchi or wheezes. ?  ?HEART: Regular rhythm S1 and S2 without murmur. ?  ?ABDOMEN: Soft and depressible, nontender with no palpable mass, no hepatomegaly.  Ileostomy is pink and patent ?  ?EXTREMITIES: Well-developed well-nourished symmetrical with no dependent edema. ?  ?NEUROLOGICAL: Awake alert oriented, facial expression symmetrical, moving all extremities. ?  ?   ?IMPRESSION:  ?  ? Status post ileostomy (CMS-HCC) [Z93.2] ? -Patient with history of partial sigmoid colectomy with anastomosis and end ileostomy creation. ?-Gastrografin enema shows initial narrowing with eventual resolution and adequate lumen of both sides.   ?-Colonoscopy shows expected inflammation in the anastomosis but adequate lumen.  ?-I discussed with patient the risk of surgery that includes pain, infection, bleeding, anastomosis leak, injury to adjacent organ, enterostomies, enterocutaneous fistula, among others.  The patient report he understood and agreed to proceed. ?  ?PLAN:  ?1.  Ileostomy reversal (44625) ?3.  Avoid taking aspirin 5 days before the procedures ?4.  Contact us if you have any concern ?  ?Patient verbalized understanding, all questions were answered, and were agreeable with the plan outlined above.  ?  ?Marymargaret Kirker Cintron-Diaz, MD ?  ?Electronically signed by Frida Wahlstrom Cintron-Diaz, MD ? ?

## 2021-07-27 NOTE — H&P (Signed)
HISTORY OF PRESENT ILLNESS:  ?  ?Timothy Cameron is a 56 y.o.male patient who comes for preoperative evaluation for ileostomy reversal. ?  ?Patient with history of perforated sigmoid colon due to ischemic colitis.  He underwent partial colectomy with anastomosis and loop ileostomy creation. ?  ?The patient has been recovering adequately.  He has been taking great care of the colostomy.  Patient is eating well.  No issues with the ileostomy bag.  Adequate skin care. ?  ?He had a Gastrografin enema imaging study that shows initial narrowing of the anastomosis with eventual resolution with adequate lumen of proximal and distal colon.  There was no other masses identified. ? ?Colonoscopy showed expected inflammatory changes on the anastomosis but adequate lumen.  ?   ?  ?PAST MEDICAL HISTORY:  ?       ?Past Medical History:  ?Diagnosis Date  ? Diabetes mellitus type 2, controlled (CMS-HCC)    ? HLD (hyperlipidemia)    ? HTN (hypertension)    ?  ?  ?  ?PAST SURGICAL HISTORY:   ?         ?Past Surgical History:  ?Procedure Laterality Date  ? APPENDECTOMY   04/26/2021  ?  Dr Arrie Senate  ? 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  Dr. Carolan Shiver  ? colon surgery      ?  before 2006 --- Kendell Bane  ?    ?   ?MEDICATIONS:  ?Encounter Medications  ?           ?Outpatient Encounter Medications as of 06/26/2021  ?Medication Sig Dispense Refill  ? atorvastatin (LIPITOR) 20 MG tablet Take 1 tablet (20 mg total) by mouth at bedtime 90 tablet 2  ? glipiZIDE (GLUCOTROL XL) 10 MG XL tablet Take 1 tablet (10 mg total) by mouth once daily 90 tablet 2  ? hydroCHLOROthiazide (HYDRODIURIL) 25 MG tablet Take 1 tablet (25 mg total) by mouth once daily 90 tablet 2  ? losartan (COZAAR) 50 MG tablet Take 1 tablet (50 mg total) by mouth once daily 90 tablet 2  ? omeprazole (PRILOSEC) 20 MG DR capsule Take 1 capsule (20 mg total) by mouth once daily 90 capsule 2  ? psyllium husk, bulk, 100 % Powd Take by  mouth at bedtime      ? sennosides-docusate (SENOKOT-S) 8.6-50 mg tablet Take 3 tablets by mouth at bedtime      ?  ?No facility-administered encounter medications on file as of 06/26/2021.  ?  ?  ?  ?ALLERGIES:   ?Patient has no known allergies. ?  ?SOCIAL HISTORY:  ?Social History  ?  ?  ?         ?Socioeconomic History  ? Marital status: Single  ?Tobacco Use  ? Smoking status: Former  ?    Types: Cigarettes  ?    Quit date: 04/26/2021  ?    Years since quitting: 0.1  ? Smokeless tobacco: Never  ?Vaping Use  ? Vaping Use: Never used  ?Substance and Sexual Activity  ? Alcohol use: Yes  ?    Comment: occasionally  ? Drug use: Not Currently  ?    Comment: prior to 2017 at least  ? Sexual activity: Yes  ?  ?  ?  ?FAMILY HISTORY:  ?         ?Family History  ?Problem Relation Age of Onset  ? Diabetes Mother    ? High blood pressure (Hypertension) Mother    ?  Irregular Heart Beat (Arrhythmia) Mother    ? Diabetes Father    ? High blood pressure (Hypertension) Father    ? COPD Brother    ? Diabetes Brother    ? Hyperlipidemia (Elevated cholesterol) Brother    ? High blood pressure (Hypertension) Brother    ? Obesity Brother    ?  ?  ?GENERAL REVIEW OF SYSTEMS:  ?  ?General ROS: negative for - chills, fatigue, fever, weight gain or weight loss ?Allergy and Immunology ROS: negative for - hives  ?Hematological and Lymphatic ROS: negative for - bleeding problems or bruising, negative for palpable nodes ?Endocrine ROS: negative for - heat or cold intolerance, hair changes ?Respiratory ROS: negative for - cough, shortness of breath or wheezing ?Cardiovascular ROS: no chest pain or palpitations ?GI ROS: negative for nausea, vomiting, abdominal pain, diarrhea, constipation ?Musculoskeletal ROS: negative for - joint swelling or muscle pain ?Neurological ROS: negative for - confusion, syncope ?Dermatological ROS: negative for pruritus and rash ?  ?PHYSICAL EXAM:  ?     ?Vitals:  ?  06/26/21 0958  ?BP: (!) 168/88  ?Pulse: 88  ?.   ?Ht:180.3 cm (5\' 11" ) Wt:95.7 kg (211 lb) surface area is 2.19 meters squared. ?Body mass index is 29.43 kg/m?FXT:KWIO. ?  ?GENERAL: Alert, active, oriented x3 ?  ?HEENT: Pupils equal reactive to light. Extraocular movements are intact. Sclera clear. Palpebral conjunctiva normal red color.Pharynx clear. ?  ?NECK: Supple with no palpable mass and no adenopathy. ?  ?LUNGS: Sound clear with no rales rhonchi or wheezes. ?  ?HEART: Regular rhythm S1 and S2 without murmur. ?  ?ABDOMEN: Soft and depressible, nontender with no palpable mass, no hepatomegaly.  Ileostomy is pink and patent ?  ?EXTREMITIES: Well-developed well-nourished symmetrical with no dependent edema. ?  ?NEUROLOGICAL: Awake alert oriented, facial expression symmetrical, moving all extremities. ?  ?   ?IMPRESSION:  ?  ? Status post ileostomy (CMS-HCC) [Z93.2] ? -Patient with history of partial sigmoid colectomy with anastomosis and end ileostomy creation. ?-Gastrografin enema shows initial narrowing with eventual resolution and adequate lumen of both sides.   ?-Colonoscopy shows expected inflammation in the anastomosis but adequate lumen.  ?-I discussed with patient the risk of surgery that includes pain, infection, bleeding, anastomosis leak, injury to adjacent organ, enterostomies, enterocutaneous fistula, among others.  The patient report he understood and agreed to proceed. ?  ?PLAN:  ?1.  Ileostomy reversal (Marland Kitchen) ?3.  Avoid taking aspirin 5 days before the procedures ?4.  Contact [phone] if you have any concern ?  ?Patient verbalized understanding, all questions were answered, and were agreeable with the plan outlined above.  ?  ?[place], MD ?  ?Electronically signed by Carolan Shiver, MD ? ?

## 2021-07-30 ENCOUNTER — Encounter
Admission: RE | Admit: 2021-07-30 | Discharge: 2021-07-30 | Disposition: A | Discharge status: Home / Self Care | Payer: 59 | Source: Ambulatory Visit | Attending: General Surgery | Admitting: General Surgery

## 2021-07-30 ENCOUNTER — Other Ambulatory Visit: Payer: Self-pay

## 2021-07-30 NOTE — OR Nursing (Signed)
Patient had questions regarding preop diet and prep. Notified Kay at [facility] of patients questions. She will contact Mr Piatkowski a review his instructions.

## 2021-07-30 NOTE — Patient Instructions (Signed)
Your procedure is scheduled on: 08/06/21 Report to [facility]. To find out your arrival time please call [phone] between 1PM - 3PM on 08/03/21.  Remember: Instructions that are not followed completely may result in serious medical risk, up to and including death, or upon the discretion of your surgeon and anesthesiologist your surgery may need to be rescheduled.     _X__ 1. Follow diet instructions as given by [facility] Gastroenterologist  __X__2.  On the morning of surgery brush your teeth with toothpaste and water, you                 may rinse your mouth with mouthwash if you wish.  Do not swallow any              toothpaste of mouthwash.     _X__ 3.  No Alcohol for 24 hours before or after surgery.   _X__ 4.  Do Not Smoke or use e-cigarettes For 24 Hours Prior to Your Surgery.                 Do not use any chewable tobacco products for at least 6 hours prior to                 surgery.  ____  5.  Bring all medications with you on the day of surgery if instructed.   __X__  6.  Notify your doctor if there is any change in your medical condition      (cold, fever, infections).     Do not wear jewelry, make-up, hairpins, clips or nail polish. Do not wear lotions, powders, or perfumes.  Do not shave body hair 48 hours prior to surgery. Men may shave face and neck. Do not bring valuables to the hospital.    [facility] is not responsible for any belongings or valuables.  Contacts, dentures/partials or body piercings may not be worn into surgery. Bring a case for your contacts, glasses or hearing aids, a denture cup will be supplied. Leave your suitcase in the car. After surgery it may be brought to your room. For patients admitted to the hospital, discharge time is determined by your treatment team.   Patients discharged the day of surgery will not be allowed to drive home.   Please read over the following fact sheets  that you were given:   CHG soap  __X__ Take these medicines the morning of surgery with A SIP OF WATER:    1. omeprazole (PRILOSEC) 20 MG capsule  2.   3.   4.  5.  6.  ____ Fleet Enema (as directed)   __X__ Use CHG Soap/SAGE wipes as directed  ____ Use inhalers on the day of surgery  ____ Stop metformin/Janumet/Farxiga 2 days prior to surgery    ____ Take 1/2 of usual insulin dose the night before surgery. No insulin the morning          of surgery.   ____ Stop Blood Thinners Coumadin/Plavix/Xarelto/Pleta/Pradaxa/Eliquis/Effient/Aspirin  on   Or contact your Surgeon, Cardiologist or Medical Doctor regarding  ability to stop your blood thinners  __X__ Stop Anti-inflammatories 7 days before surgery such as Advil, Ibuprofen, Motrin,  BC or Goodies Powder, Naprosyn, Naproxen, Aleve, Aspirin   May take Tylenol if needed  __X__ Stop all herbals and supplements, fish oil or vitamins  until after surgery.    ____ Bring C-Pap to the hospital.

## 2021-08-05 NOTE — Anesthesia Preprocedure Evaluation (Addendum)
Anesthesia Evaluation  ?Patient identified by MRN, date of birth, ID band ?Patient awake ? ? ? ?Reviewed: ?Allergy & Precautions, NPO status , Patient's Chart, lab work & pertinent test results ? ?History of Anesthesia Complications ?Negative for: history of anesthetic complications ? ?Airway ?Mallampati: II ? ?TM Distance: >3 FB ?Neck ROM: Full ? ? ? Dental ? ?(+) Missing ?  ?Pulmonary ?sleep apnea , neg COPD, neg recent URI, Current SmokerPatient did not abstain from smoking.,  ?CT SCAN 1/23: ?IMPRESSION: ?There is complete atelectasis in the left lower lobe, possibly due ?to mucous plugging in the central bronchi. Small left pleural ?effusion. ?? ?There are patchy ground-glass infiltrates in the right lung ?suggesting possible multifocal pneumonia. ?  ?Pulmonary exam normal ?breath sounds clear to auscultation ? ? ? ? ? ? Cardiovascular ?Exercise Tolerance: Good ?hypertension, Pt. on medications ?(-) angina(-) Past MI and (-) Cardiac Stents Normal cardiovascular exam(-) dysrhythmias (-) Valvular Problems/Murmurs ?Rhythm:Regular Rate:Normal ?- Systolic murmurs ? ?  ?Neuro/Psych ?negative neurological ROS ? negative psych ROS  ? GI/Hepatic ?GERD  Controlled and Medicated,(+)  ?  ? substance abuse (history of cocaine abuse, last UDS was positive) ? marijuana use, Cocaine use over 3 years ago. Denies any current use ?H/o Lower GI bleed ?History of perforated sigmoid colon due to ischemic colitis.  He underwent partial colectomy with anastomosis and loop ileostomy creation ?  ?Endo/Other  ?diabetes ? Renal/GU ?negative Renal ROS  ?negative genitourinary ?  ?Musculoskeletal ?negative musculoskeletal ROS ?(+)  ? Abdominal ?Normal abdominal exam  (+)   ?Peds ?negative pediatric ROS ?(+)  Hematology ?negative hematology ROS ?(+)   ?Anesthesia Other Findings ?Past Medical History: ?No date: Diabetes [facility]) ?No date: High cholesterol ?No date: Hypertension ? ? ? Reproductive/Obstetrics ? ?   ? ? ? ? ? ? ? ? ? ? ? ? ? ?  ?  ? ? ? ? ? ? ?Anesthesia Physical ? ?Anesthesia Plan ? ?ASA: 2 ? ?Anesthesia Plan: General  ? ?Post-op Pain Management: Regional block* and Ofirmev IV (intra-op)*  ? ?Induction: Intravenous ? ?PONV Risk Score and Plan: 1 and Ondansetron, Dexamethasone and Midazolam ? ?Airway Management Planned: Oral ETT ? ?Additional Equipment: None ? ?Intra-op Plan:  ? ?Post-operative Plan: Extubation in OR ? ?Informed Consent: I have reviewed the patients History and Physical, chart, labs and discussed the procedure including the risks, benefits and alternatives for the proposed anesthesia with the patient or authorized representative who has indicated his/her understanding and acceptance.  ? ? ? ?Dental advisory given ? ?Plan Discussed with: CRNA and Anesthesiologist ? ?Anesthesia Plan Comments: (Discussed risks of anesthesia with patient, including possibility of difficulty with spontaneous ventilation under anesthesia necessitating airway intervention, PONV, and rare risks such as cardiac or respiratory or neurological events, and allergic reactions. Discussed the role of CRNA in patient's perioperative care. Patient understands. ?Patient counseled on benefits of smoking cessation, and increased perioperative risks associated with continued smoking. ?)  ? ? ? ? ? ?Anesthesia Quick Evaluation ? ?

## 2021-08-06 ENCOUNTER — Inpatient Hospital Stay: Payer: 59 | Admitting: Anesthesiology

## 2021-08-06 ENCOUNTER — Other Ambulatory Visit: Payer: Self-pay

## 2021-08-06 ENCOUNTER — Ambulatory Visit: Payer: 59 | Admitting: *Deleted

## 2021-08-06 ENCOUNTER — Encounter
Admission: RE | Disposition: A | Discharge status: Home / Self Care | Payer: Self-pay | Source: Home / Self Care | Attending: General Surgery

## 2021-08-06 ENCOUNTER — Inpatient Hospital Stay
Admission: RE | Admit: 2021-08-06 | Discharge: 2021-08-07 | DRG: 331 | Disposition: A | Discharge status: Home / Self Care | Payer: 59 | Attending: General Surgery | Admitting: General Surgery

## 2021-08-06 ENCOUNTER — Encounter: Payer: Self-pay | Admitting: General Surgery

## 2021-08-06 DIAGNOSIS — K219 Gastro-esophageal reflux disease without esophagitis: Secondary | ICD-10-CM | POA: Diagnosis present

## 2021-08-06 DIAGNOSIS — Z8249 Family history of ischemic heart disease and other diseases of the circulatory system: Secondary | ICD-10-CM

## 2021-08-06 DIAGNOSIS — E119 Type 2 diabetes mellitus without complications: Secondary | ICD-10-CM | POA: Diagnosis not present

## 2021-08-06 DIAGNOSIS — Z833 Family history of diabetes mellitus: Secondary | ICD-10-CM

## 2021-08-06 DIAGNOSIS — Z7984 Long term (current) use of oral hypoglycemic drugs: Secondary | ICD-10-CM | POA: Diagnosis not present

## 2021-08-06 DIAGNOSIS — K660 Peritoneal adhesions (postprocedural) (postinfection): Secondary | ICD-10-CM | POA: Diagnosis present

## 2021-08-06 DIAGNOSIS — Z932 Ileostomy status: Principal | ICD-10-CM

## 2021-08-06 DIAGNOSIS — Z825 Family history of asthma and other chronic lower respiratory diseases: Secondary | ICD-10-CM | POA: Diagnosis not present

## 2021-08-06 DIAGNOSIS — E7800 Pure hypercholesterolemia, unspecified: Secondary | ICD-10-CM | POA: Diagnosis present

## 2021-08-06 DIAGNOSIS — Z87891 Personal history of nicotine dependence: Secondary | ICD-10-CM

## 2021-08-06 DIAGNOSIS — I10 Essential (primary) hypertension: Secondary | ICD-10-CM | POA: Diagnosis not present

## 2021-08-06 DIAGNOSIS — G4730 Sleep apnea, unspecified: Secondary | ICD-10-CM | POA: Diagnosis not present

## 2021-08-06 DIAGNOSIS — Z432 Encounter for attention to ileostomy: Secondary | ICD-10-CM | POA: Diagnosis not present

## 2021-08-06 DIAGNOSIS — Z79899 Other long term (current) drug therapy: Secondary | ICD-10-CM

## 2021-08-06 HISTORY — PX: ILEOSTOMY CLOSURE: SHX1784

## 2021-08-06 LAB — GLUCOSE, CAPILLARY
Glucose-Capillary: 160 mg/dL — ABNORMAL HIGH (ref 70–99)
Glucose-Capillary: 224 mg/dL — ABNORMAL HIGH (ref 70–99)
Glucose-Capillary: 232 mg/dL — ABNORMAL HIGH (ref 70–99)
Glucose-Capillary: 317 mg/dL — ABNORMAL HIGH (ref 70–99)
Glucose-Capillary: 205 mg/dL — ABNORMAL HIGH (ref 70–99)

## 2021-08-06 SURGERY — REVISION, ILEOSTOMY
Anesthesia: General | Site: Abdomen

## 2021-08-06 MED ORDER — SUGAMMADEX SODIUM 200 MG/2ML IV SOLN
INTRAVENOUS | Status: DC | PRN
  Administered 2021-08-06: 200 mg via INTRAVENOUS

## 2021-08-06 MED ORDER — DROPERIDOL 2.5 MG/ML IJ SOLN: 0.6250 mg | Freq: Once | INTRAMUSCULAR | Status: DC | PRN

## 2021-08-06 MED ORDER — HYDROMORPHONE HCL 1 MG/ML IJ SOLN
INTRAMUSCULAR | Status: AC
  Filled 2021-08-06: qty 1

## 2021-08-06 MED ORDER — DEXMEDETOMIDINE HCL IN NACL 80 MCG/20ML IV SOLN
INTRAVENOUS | Status: AC
  Filled 2021-08-06: qty 20

## 2021-08-06 MED ORDER — PHENYLEPHRINE 40 MCG/ML (10ML) SYRINGE FOR IV PUSH (FOR BLOOD PRESSURE SUPPORT)
PREFILLED_SYRINGE | INTRAVENOUS | Status: DC | PRN
  Administered 2021-08-06: 120 ug via INTRAVENOUS
  Administered 2021-08-06: 80 ug via INTRAVENOUS
  Administered 2021-08-06: 40 ug via INTRAVENOUS
  Administered 2021-08-06 (×3): 80 ug via INTRAVENOUS
  Administered 2021-08-06: 160 ug via INTRAVENOUS

## 2021-08-06 MED ORDER — HYDROMORPHONE HCL 1 MG/ML IJ SOLN
0.2500 mg | INTRAMUSCULAR | Status: DC | PRN
  Administered 2021-08-06 (×4): 0.5 mg via INTRAVENOUS

## 2021-08-06 MED ORDER — PROMETHAZINE HCL 25 MG/ML IJ SOLN: 6.2500 mg | INTRAMUSCULAR | Status: DC | PRN

## 2021-08-06 MED ORDER — IPRATROPIUM-ALBUTEROL 0.5-2.5 (3) MG/3ML IN SOLN
3.0000 mL | Freq: Once | RESPIRATORY_TRACT | Status: AC | PRN
  Administered 2021-08-06: 3 mL via RESPIRATORY_TRACT

## 2021-08-06 MED ORDER — FENTANYL CITRATE (PF) 100 MCG/2ML IJ SOLN
INTRAMUSCULAR | Status: AC
  Filled 2021-08-06: qty 2

## 2021-08-06 MED ORDER — ONDANSETRON 4 MG PO TBDP
4.0000 mg | ORAL_TABLET | Freq: Four times a day (QID) | ORAL | Status: DC | PRN
Start: 2021-08-06 — End: 2021-08-07

## 2021-08-06 MED ORDER — ROCURONIUM BROMIDE 10 MG/ML (PF) SYRINGE
PREFILLED_SYRINGE | INTRAVENOUS | Status: AC
  Filled 2021-08-06: qty 10

## 2021-08-06 MED ORDER — ONDANSETRON HCL 4 MG/2ML IJ SOLN
4.0000 mg | Freq: Four times a day (QID) | INTRAMUSCULAR | Status: DC | PRN
Start: 2021-08-06 — End: 2021-08-07

## 2021-08-06 MED ORDER — INSULIN ASPART 100 UNIT/ML IJ SOLN
5.0000 [IU] | Freq: Once | INTRAMUSCULAR | Status: AC
Start: 2021-08-06 — End: 2021-08-06
  Administered 2021-08-06: 5 [IU] via SUBCUTANEOUS

## 2021-08-06 MED ORDER — BUPIVACAINE-EPINEPHRINE (PF) 0.25% -1:200000 IJ SOLN
INTRAMUSCULAR | Status: DC | PRN
  Administered 2021-08-06: 20 mL

## 2021-08-06 MED ORDER — ORAL CARE MOUTH RINSE: 15.0000 mL | Freq: Once | OROMUCOSAL | Status: AC

## 2021-08-06 MED ORDER — HYDROCODONE-ACETAMINOPHEN 5-325 MG PO TABS
1.0000 | ORAL_TABLET | ORAL | Status: DC | PRN
  Administered 2021-08-06: 2 via ORAL
  Filled 2021-08-06: qty 2
  Filled 2021-08-06: qty 1

## 2021-08-06 MED ORDER — IPRATROPIUM-ALBUTEROL 0.5-2.5 (3) MG/3ML IN SOLN
RESPIRATORY_TRACT | Status: AC
  Filled 2021-08-06: qty 3

## 2021-08-06 MED ORDER — ROCURONIUM BROMIDE 100 MG/10ML IV SOLN
INTRAVENOUS | Status: DC | PRN
  Administered 2021-08-06: 20 mg via INTRAVENOUS
  Administered 2021-08-06: 50 mg via INTRAVENOUS
  Administered 2021-08-06: 20 mg via INTRAVENOUS

## 2021-08-06 MED ORDER — BUPIVACAINE LIPOSOME 1.3 % IJ SUSP
INTRAMUSCULAR | Status: DC | PRN
Start: 2021-08-06 — End: 2021-08-06
  Administered 2021-08-06: 20 mL

## 2021-08-06 MED ORDER — PROPOFOL 10 MG/ML IV BOLUS
INTRAVENOUS | Status: AC
  Filled 2021-08-06: qty 40

## 2021-08-06 MED ORDER — POLYETHYLENE GLYCOL 3350 17 G PO PACK
17.0000 g | PACK | Freq: Every day | ORAL | Status: DC
  Administered 2021-08-06 – 2021-08-07 (×2): 17 g via ORAL
  Filled 2021-08-06 (×2): qty 1

## 2021-08-06 MED ORDER — PROPOFOL 10 MG/ML IV BOLUS
INTRAVENOUS | Status: DC | PRN
Start: 2021-08-06 — End: 2021-08-06
  Administered 2021-08-06: 50 mg via INTRAVENOUS
  Administered 2021-08-06: 20 mg via INTRAVENOUS
  Administered 2021-08-06: 200 mg via INTRAVENOUS
  Administered 2021-08-06: 50 mg via INTRAVENOUS

## 2021-08-06 MED ORDER — CEFAZOLIN SODIUM-DEXTROSE 2-4 GM/100ML-% IV SOLN
INTRAVENOUS | Status: AC
Start: 2021-08-06 — End: 2021-08-06
  Filled 2021-08-06: qty 100

## 2021-08-06 MED ORDER — MORPHINE SULFATE (PF) 4 MG/ML IV SOLN
4.0000 mg | INTRAVENOUS | Status: DC | PRN
  Administered 2021-08-06 – 2021-08-07 (×6): 4 mg via INTRAVENOUS
  Filled 2021-08-06 (×6): qty 1

## 2021-08-06 MED ORDER — INSULIN ASPART 100 UNIT/ML IJ SOLN
0.0000 [IU] | Freq: Three times a day (TID) | INTRAMUSCULAR | Status: DC
  Administered 2021-08-06: 11 [IU] via SUBCUTANEOUS
  Administered 2021-08-07: 5 [IU] via SUBCUTANEOUS
  Administered 2021-08-07: 2 [IU] via SUBCUTANEOUS
  Filled 2021-08-06 (×3): qty 1

## 2021-08-06 MED ORDER — CHLORHEXIDINE GLUCONATE 0.12 % MT SOLN
OROMUCOSAL | Status: AC
  Administered 2021-08-06: 15 mL via OROMUCOSAL
  Filled 2021-08-06: qty 15

## 2021-08-06 MED ORDER — BUPIVACAINE LIPOSOME 1.3 % IJ SUSP
INTRAMUSCULAR | Status: AC
Start: 2021-08-06 — End: ?
  Filled 2021-08-06: qty 20

## 2021-08-06 MED ORDER — SODIUM CHLORIDE 0.9 % IV SOLN: INTRAVENOUS | Status: DC

## 2021-08-06 MED ORDER — ENOXAPARIN SODIUM 40 MG/0.4ML IJ SOSY
40.0000 mg | PREFILLED_SYRINGE | INTRAMUSCULAR | Status: DC
  Administered 2021-08-07: 40 mg via SUBCUTANEOUS
  Filled 2021-08-06: qty 0.4

## 2021-08-06 MED ORDER — MIDAZOLAM HCL 2 MG/2ML IJ SOLN
INTRAMUSCULAR | Status: AC
  Filled 2021-08-06: qty 2

## 2021-08-06 MED ORDER — PHENYLEPHRINE HCL (PRESSORS) 10 MG/ML IV SOLN
INTRAVENOUS | Status: DC | PRN
  Administered 2021-08-06: 120 ug via INTRAVENOUS

## 2021-08-06 MED ORDER — DEXMEDETOMIDINE (PRECEDEX) IN NS 20 MCG/5ML (4 MCG/ML) IV SYRINGE
PREFILLED_SYRINGE | INTRAVENOUS | Status: DC | PRN
Start: 2021-08-06 — End: 2021-08-06
  Administered 2021-08-06: 4 ug via INTRAVENOUS
  Administered 2021-08-06: 8 ug via INTRAVENOUS

## 2021-08-06 MED ORDER — FENTANYL CITRATE (PF) 100 MCG/2ML IJ SOLN
INTRAMUSCULAR | Status: DC | PRN
  Administered 2021-08-06: 25 ug via INTRAVENOUS
  Administered 2021-08-06: 50 ug via INTRAVENOUS
  Administered 2021-08-06: 25 ug via INTRAVENOUS

## 2021-08-06 MED ORDER — MIDAZOLAM HCL 2 MG/2ML IJ SOLN
INTRAMUSCULAR | Status: DC | PRN
Start: 2021-08-06 — End: 2021-08-06
  Administered 2021-08-06: 2 mg via INTRAVENOUS

## 2021-08-06 MED ORDER — SUCCINYLCHOLINE CHLORIDE 200 MG/10ML IV SOSY
PREFILLED_SYRINGE | INTRAVENOUS | Status: AC
  Filled 2021-08-06: qty 10

## 2021-08-06 MED ORDER — ONDANSETRON HCL 4 MG/2ML IJ SOLN
INTRAMUSCULAR | Status: DC | PRN
  Administered 2021-08-06: 4 mg via INTRAVENOUS

## 2021-08-06 MED ORDER — ACETAMINOPHEN 10 MG/ML IV SOLN
1000.0000 mg | Freq: Once | INTRAVENOUS | Status: DC | PRN
  Administered 2021-08-06: 1000 mg via INTRAVENOUS

## 2021-08-06 MED ORDER — ROCURONIUM BROMIDE 100 MG/10ML IV SOLN
INTRAVENOUS | Status: DC | PRN
Start: 2021-08-06 — End: 2021-08-06

## 2021-08-06 MED ORDER — ACETAMINOPHEN 10 MG/ML IV SOLN
INTRAVENOUS | Status: AC
  Filled 2021-08-06: qty 100

## 2021-08-06 MED ORDER — ONDANSETRON HCL 4 MG/2ML IJ SOLN
INTRAMUSCULAR | Status: AC
  Filled 2021-08-06: qty 2

## 2021-08-06 MED ORDER — BUPIVACAINE-EPINEPHRINE (PF) 0.25% -1:200000 IJ SOLN
INTRAMUSCULAR | Status: AC
  Filled 2021-08-06: qty 30

## 2021-08-06 MED ORDER — CHLORHEXIDINE GLUCONATE 0.12 % MT SOLN: 15.0000 mL | Freq: Once | OROMUCOSAL | Status: AC

## 2021-08-06 MED ORDER — CEFAZOLIN SODIUM-DEXTROSE 2-4 GM/100ML-% IV SOLN
2.0000 g | INTRAVENOUS | Status: AC
  Administered 2021-08-06: 2 g via INTRAVENOUS

## 2021-08-06 MED ORDER — PANTOPRAZOLE SODIUM 40 MG IV SOLR
40.0000 mg | Freq: Every day | INTRAVENOUS | Status: DC
  Administered 2021-08-06: 40 mg via INTRAVENOUS
  Filled 2021-08-06: qty 10

## 2021-08-06 MED ORDER — DEXAMETHASONE SODIUM PHOSPHATE 10 MG/ML IJ SOLN
INTRAMUSCULAR | Status: DC | PRN
  Administered 2021-08-06: 5 mg via INTRAVENOUS

## 2021-08-06 MED ORDER — HYDROCHLOROTHIAZIDE 25 MG PO TABS
25.0000 mg | ORAL_TABLET | Freq: Every day | ORAL | Status: DC
  Administered 2021-08-06: 25 mg via ORAL
  Filled 2021-08-06: qty 1

## 2021-08-06 MED ORDER — LIDOCAINE HCL (PF) 2 % IJ SOLN
INTRAMUSCULAR | Status: AC
  Filled 2021-08-06: qty 5

## 2021-08-06 MED ORDER — INSULIN ASPART 100 UNIT/ML IJ SOLN
INTRAMUSCULAR | Status: AC
  Filled 2021-08-06: qty 1

## 2021-08-06 MED ORDER — PHENYLEPHRINE HCL-NACL 20-0.9 MG/250ML-% IV SOLN
INTRAVENOUS | Status: AC
  Filled 2021-08-06: qty 250

## 2021-08-06 MED ORDER — LOSARTAN POTASSIUM 50 MG PO TABS
50.0000 mg | ORAL_TABLET | Freq: Every day | ORAL | Status: DC
  Administered 2021-08-06: 50 mg via ORAL
  Filled 2021-08-06: qty 1

## 2021-08-06 MED ORDER — DEXAMETHASONE SODIUM PHOSPHATE 10 MG/ML IJ SOLN
INTRAMUSCULAR | Status: AC
  Filled 2021-08-06: qty 1

## 2021-08-06 SURGICAL SUPPLY — 56 items
CHLORAPREP W/TINT 26 (MISCELLANEOUS) ×1 IMPLANT
DRAPE INCISE IOBAN 66X45 STRL (DRAPES) IMPLANT
DRAPE LAPAROTOMY 100X77 ABD (DRAPES) ×2 IMPLANT
DRAPE LEGGINS SURG 28X43 STRL (DRAPES) ×1 IMPLANT
DRAPE UNDER BUTTOCK W/FLU (DRAPES) ×1 IMPLANT
DRSG TELFA 3X8 NADH (GAUZE/BANDAGES/DRESSINGS) IMPLANT
ELECT BLADE 6 FLAT ULTRCLN (ELECTRODE) ×2 IMPLANT
ELECT REM PT RETURN 9FT ADLT (ELECTROSURGICAL) ×2
ELECTRODE REM PT RTRN 9FT ADLT (ELECTROSURGICAL) ×1 IMPLANT
GAUZE 4X4 16PLY ~~LOC~~+RFID DBL (SPONGE) ×1 IMPLANT
GAUZE PACKING IODOFORM 2 (PACKING) ×1 IMPLANT
GLOVE BIO SURGEON STRL SZ 6.5 (GLOVE) ×6 IMPLANT
GLOVE BIOGEL PI IND STRL 6.5 (GLOVE) ×2 IMPLANT
GLOVE BIOGEL PI IND STRL 7.0 (GLOVE) ×2 IMPLANT
GLOVE BIOGEL PI INDICATOR 6.5 (GLOVE) ×3
GLOVE BIOGEL PI INDICATOR 7.0 (GLOVE) ×3
GLOVE SURG SYN 6.5 ES PF (GLOVE) ×6 IMPLANT
GLOVE SURG SYN 6.5 PF PI (GLOVE) ×3 IMPLANT
GOWN STRL REUS W/ TWL LRG LVL3 (GOWN DISPOSABLE) ×6 IMPLANT
GOWN STRL REUS W/TWL LRG LVL3 (GOWN DISPOSABLE) ×6
KIT TURNOVER KIT A (KITS) ×2 IMPLANT
LABEL OR SOLS (LABEL) ×2 IMPLANT
LIGASURE IMPACT 36 18CM CVD LR (INSTRUMENTS) ×1 IMPLANT
MANIFOLD NEPTUNE II (INSTRUMENTS) ×2 IMPLANT
NS IRRIG 1000ML POUR BTL (IV SOLUTION) ×2 IMPLANT
PACK BASIN MAJOR ARMC (MISCELLANEOUS) ×2 IMPLANT
PAD DRESSING TELFA 3X8 NADH (GAUZE/BANDAGES/DRESSINGS) ×1 IMPLANT
PAD PREP 24X41 OB/GYN DISP (PERSONAL CARE ITEMS) ×1 IMPLANT
RELOAD BL CONTOUR (ENDOMECHANICALS) IMPLANT
RELOAD LINEAR CUT PROX 55 BLUE (ENDOMECHANICALS) ×2 IMPLANT
RELOAD PROXIMATE 75MM BLUE (ENDOMECHANICALS) IMPLANT
RELOAD STAPLE 40 BLU REG (ENDOMECHANICALS) IMPLANT
RELOAD STAPLE 55 3.8 BLU REG (ENDOMECHANICALS) IMPLANT
RELOAD STAPLE 75 3.8 BLU REG (ENDOMECHANICALS) IMPLANT
SET YANKAUER POOLE SUCT (MISCELLANEOUS) ×1 IMPLANT
SPONGE KITTNER 5P (MISCELLANEOUS) ×1 IMPLANT
SPONGE T-LAP 18X18 ~~LOC~~+RFID (SPONGE) ×4 IMPLANT
STAPLER CIRCULAR MANUAL XL 29 (STAPLE) ×1 IMPLANT
STAPLER CVD CUT BL 40 RELOAD (ENDOMECHANICALS) IMPLANT
STAPLER CVD CUT BLU 40 RELOAD (ENDOMECHANICALS) IMPLANT
STAPLER PROXIMATE 55 BLUE (STAPLE) ×1 IMPLANT
STAPLER PROXIMATE 75MM BLUE (STAPLE) IMPLANT
STAPLER SKIN PROX 35W (STAPLE) ×1 IMPLANT
SUT ETH BLK MONO 3 0 FS 1 12/B (SUTURE) ×1 IMPLANT
SUT PROLENE 0 CT 1 30 (SUTURE) ×4 IMPLANT
SUT SILK 3-0 (SUTURE) ×1 IMPLANT
SUT STRATAFIX SPIRAL PDS+ 0 30 (SUTURE) ×1 IMPLANT
SUT VIC AB 2-0 BRD 54 (SUTURE) ×1 IMPLANT
SUT VIC AB 3-0 54X BRD REEL (SUTURE) ×1 IMPLANT
SUT VIC AB 3-0 BRD 54 (SUTURE)
SUT VIC AB 3-0 SH 27 (SUTURE)
SUT VIC AB 3-0 SH 27X BRD (SUTURE) ×2 IMPLANT
SUT VLOC 90 6 CV-15 VIOLET (SUTURE) ×1 IMPLANT
SYR BULB IRRIG 60ML STRL (SYRINGE) ×2 IMPLANT
TRAY FOLEY MTR SLVR 16FR STAT (SET/KITS/TRAYS/PACK) ×1 IMPLANT
WATER STERILE IRR 500ML POUR (IV SOLUTION) ×1 IMPLANT

## 2021-08-06 NOTE — Op Note (Signed)
Preoperative diagnosis: Ileostomy status  ? ?Postoperative diagnosis: Ileostomy status.  ? ?Procedure: Small bowel resection and reversal of ileostomy. ? ?Anesthesia: GETA ? ?Surgeon: Dr. Hazle Quant, MD ? ?Wound Classification: Contaminated ? ?Indications:  Patient is a 56 y.o. male who underwent partial colectomy and anastomosis with proximal diverting loop ileostomy 2 months ago. Flexible sigmoidoscopy showed adequate lumen.  ? ?Findings: ?1.  Proximal and distal healthy segment of small bowel ?2.  Adequate hemostasis ? ?Description of procedure: The patient was placed in a supine position. The procedure was performed under general anesthesia. The ileostomy bag was removed, the ileostomy site cleaned, then the abdomen was prepped and draped in a sterile manner. A time-out was completed verifying correct patient, procedure, site, positioning, and implant(s) and/or special equipment prior to beginning this procedure. An elliptical skin incision about 2 mm from mucocutaneous junction, around the ileostomy was performed. The incision was deepened in the subcutaneous tissue until the serosa of the emerging bowel appeared. Sharp dissection was continued circumferentially in this plane, dividing the fine adhesions between the bowel and its mesentery and the subcutaneous fat until the fascia was reached, and the peritoneal cavity entered, after which it was feasible to bring the emerging ileal loop through the wound, the ileostomy proximal and the segment of small bowel and mucocutaneous junction and the rim of skin were resected using a GIA stapler to achieve fresh and viable segment of small bowel.  The 2 segment of small bowel were approximated to each other.  Enterotomies were done and GIA stapler was introduced to create the anastomosis.  The enterostomy was standing closed with 2-0 V-Loc.  The site was checked for hemostasis, which was adequate, the loop of ileum was returned back to the abdominal cavity.   ?Hemostasis was secured, fascial defect was closed using continuous nonabsorbable suture, and skin approximated with staples leaving a small openings with iodoform packing for drainage.   ?The patient tolerated the procedure well and was transferred to the postanesthesia care unit in stable condition.  ? ?Specimen: Ileostomy ? ?Complications: None ? ?Estimated Blood Loss: 10 mL ? ?

## 2021-08-06 NOTE — Interval H&P Note (Signed)
History and Physical Interval Note: ? ?08/06/2021 ?6:50 AM ? ?Timothy Cameron  has presented today for surgery, with the diagnosis of Z93.2 status post ileostomy.  The various methods of treatment have been discussed with the patient and family. After consideration of risks, benefits and other options for treatment, the patient has consented to  Procedure(s): ?ILEOSTOMY TAKEDOWN REVISION (N/A) as a surgical intervention.  The patient's history has been reviewed, patient examined, no change in status, stable for surgery.  I have reviewed the patient's chart and labs.  Questions were answered to the patient's satisfaction.   ? ? ?[name] ? ? ?

## 2021-08-06 NOTE — TOC Initial Note (Signed)
Transition of Care (TOC) - Initial/Assessment Note  ? ? ?Patient Details  ?Name: Timothy Cameron ?MRN: [id number] ?Date of Birth: [birth year] ? ?Transition of Care (TOC) CM/SW Contact:    ?Beverly Sessions, RN ?Phone Number: ?08/06/2021, 4:30 PM ? ?Clinical Narrative:                 ? ? ?  ?Transition of Care (TOC) Screening Note ? ? ?Patient Details  ?Name: Timothy Cameron ?Date of Birth: 30-Sep-1965 ? ? ?Transition of Care (TOC) CM/SW Contact:    ?Beverly Sessions, RN ?Phone Number: ?08/06/2021, 4:30 PM ? ? ? ?Transition of Care Department [facility]) has reviewed patient and no TOC needs have been identified at this time. We will continue to monitor patient advancement through interdisciplinary progression rounds. If new patient transition needs arise, please place a TOC consult. ?  ?  ? ? ?Patient Goals and CMS Choice ?  ?  ?  ? ?Expected Discharge Plan and Services ?  ?  ?  ?  ?  ?                ?  ?  ?  ?  ?  ?  ?  ?  ?  ?  ? ?Prior Living Arrangements/Services ?  ?  ?  ?       ?  ?  ?  ?  ? ?Activities of Daily Living ?Home Assistive Devices/Equipment: Eyeglasses ?ADL Screening (condition at time of admission) ?Patient's cognitive ability adequate to safely complete daily activities?: Yes ?Is the patient deaf or have difficulty hearing?: No ?Does the patient have difficulty seeing, even when wearing glasses/contacts?: No ?Does the patient have difficulty concentrating, remembering, or making decisions?: No ?Patient able to express need for assistance with ADLs?: Yes ?Does the patient have difficulty dressing or bathing?: No ?Independently performs ADLs?: Yes (appropriate for developmental age) ?Does the patient have difficulty walking or climbing stairs?: No ?Weakness of Legs: None ?Weakness of Arms/Hands: None ? ?Permission Sought/Granted ?  ?  ?   ?   ?   ?   ? ?Emotional Assessment ?  ?  ?  ?  ?  ?  ? ?Admission diagnosis:  Ileostomy status ([place]) [Z93.2] ?Patient Active Problem List  ? Diagnosis Date Noted  ?  Ileostomy status ([place]) 08/06/2021  ? Bacteremia due to Gram-negative bacteria   ? Atelectasis of left lung 04/27/2021  ? Pneumonia due to COVID-19 virus 04/27/2021  ? Bowel perforation ([place]) 04/26/2021  ? HTN (hypertension) 04/26/2021  ? HLD (hyperlipidemia) 04/26/2021  ? Diverticulitis of large intestine with perforation without bleeding 04/26/2021  ? Acute posthemorrhagic anemia 03/09/2016  ? Thrombocytopenia ([place]) 03/09/2016  ? Type 2 diabetes mellitus with hyperlipidemia ([place]) 03/09/2016  ? Tobacco abuse counseling 03/09/2016  ? GIB (gastrointestinal bleeding) 03/08/2016  ? Lower GI bleed   ? ?PCP:  Leonel Ramsay, MD ?Pharmacy:   ?[id number] - [place], [place] - [address] ?[address] ?[place] [address] ?Phone: [phone] Fax: [phone] ? ? ? ? ?Social Determinants of Health (SDOH) Interventions ?  ? ?Readmission Risk Interventions ?   ? View : No data to display.  ?  ?  ?  ? ? ? ?

## 2021-08-06 NOTE — Anesthesia Postprocedure Evaluation (Signed)
Anesthesia Post Note ? ?Patient: Timothy Cameron ? ?Procedure(s) Performed: ILEOSTOMY TAKEDOWN REVISION (Abdomen) ? ?Patient location during evaluation: PACU ?Anesthesia Type: General ?Level of consciousness: awake and alert, oriented and patient cooperative ?Pain management: pain level controlled ?Vital Signs Assessment: post-procedure vital signs reviewed and stable ?Respiratory status: spontaneous breathing, nonlabored ventilation and respiratory function stable ?Cardiovascular status: blood pressure returned to baseline and stable ?Postop Assessment: adequate PO intake ?Anesthetic complications: no ? ? ?No notable events documented. ? ? ?Last Vitals:  ?Vitals:  ? 08/06/21 0945 08/06/21 1000  ?BP: 133/69 112/65  ?Pulse: 84 83  ?Resp: 16 16  ?Temp: (!) 36.4 ?C   ?SpO2: 94% 95%  ?  ?Last Pain:  ?Vitals:  ? 08/06/21 1000  ?TempSrc:   ?PainSc: Asleep  ? ? ?  ?  ?  ?  ?  ?  ? ?[name] ? ? ? ? ?

## 2021-08-06 NOTE — Anesthesia Procedure Notes (Signed)
Procedure Name: Intubation ?Date/Time: 08/06/2021 7:35 AM ?Performed by: Larry Sierras, RN ?Pre-anesthesia Checklist: Patient identified, Patient being monitored, Timeout performed, Emergency Drugs available and Suction available ?Patient Re-evaluated:Patient Re-evaluated prior to induction ?Oxygen Delivery Method: Circle system utilized ?Preoxygenation: Pre-oxygenation with 100% oxygen ?Induction Type: IV induction ?Ventilation: Mask ventilation without difficulty and Oral airway inserted - appropriate to patient size ?Laryngoscope Size: 4 and McGraph ?Grade View: Grade I ?Tube type: Oral ?Tube size: 7.5 mm ?Number of attempts: 1 ?Airway Equipment and Method: Stylet ?Placement Confirmation: ETT inserted through vocal cords under direct vision, positive ETCO2 and breath sounds checked- equal and bilateral ?Secured at: 22 cm ?Tube secured with: Tape ?Dental Injury: Teeth and Oropharynx as per pre-operative assessment  ? ? ? ? ?

## 2021-08-06 NOTE — Transfer of Care (Signed)
Immediate Anesthesia Transfer of Care Note ? ?Patient: Timothy Cameron ? ?Procedure(s) Performed: ILEOSTOMY TAKEDOWN REVISION (Abdomen) ? ?Patient Location: PACU ? ?Anesthesia Type:General ? ?Level of Consciousness: drowsy, patient cooperative and responds to stimulation ? ?Airway & Oxygen Therapy: Patient Spontanous Breathing and Patient connected to face mask oxygen ? ?Post-op Assessment: Report given to RN, Post -op Vital signs reviewed and stable and Patient moving all extremities X 4 ? ?Post vital signs: Reviewed and stable ? ?Last Vitals:  ?Vitals Value Taken Time  ?BP 112/80 08/06/21 0905  ?Temp 36 ?C 08/06/21 0905  ?Pulse 80 08/06/21 0906  ?Resp 16 08/06/21 0906  ?SpO2 100 % 08/06/21 0906  ?Vitals shown include unvalidated device data. ? ?Last Pain:  ?Vitals:  ? 08/06/21 0905  ?TempSrc: Tympanic  ?PainSc:   ?   ? ?  ? ?Complications: No notable events documented. ?

## 2021-08-06 NOTE — Progress Notes (Signed)
Patient dressing is shadowing over half of the post op initial dressing. Notified Dr. Hazle Quant. Per Dr. Maia Plan, patient is expected to have drainage on dressing due to the wound being partially open with packing. Can be changed PRN with gauze and paper tape.  ?

## 2021-08-06 NOTE — Progress Notes (Signed)
Notified patient family that he is doing well, and patient is going to room 201 on the second floor. ?

## 2021-08-07 ENCOUNTER — Encounter: Payer: Self-pay | Admitting: General Surgery

## 2021-08-07 LAB — GLUCOSE, CAPILLARY
Glucose-Capillary: 145 mg/dL — ABNORMAL HIGH (ref 70–99)
Glucose-Capillary: 214 mg/dL — ABNORMAL HIGH (ref 70–99)

## 2021-08-07 LAB — BASIC METABOLIC PANEL
Anion gap: 6 (ref 5–15)
BUN: 14 mg/dL (ref 6–20)
CO2: 24 mmol/L (ref 22–32)
Calcium: 8.7 mg/dL — ABNORMAL LOW (ref 8.9–10.3)
Chloride: 106 mmol/L (ref 98–111)
Creatinine, Ser: 0.8 mg/dL (ref 0.61–1.24)
GFR, Estimated: >60 mL/min (ref >60)
Glucose, Bld: 119 mg/dL — ABNORMAL HIGH (ref 70–99)
Potassium: 3.5 mmol/L (ref 3.5–5.1)
Sodium: 136 mmol/L (ref 135–145)

## 2021-08-07 LAB — CBC
HCT: 39.9 % (ref 39.0–52.0)
Hemoglobin: 14.1 g/dL (ref 13.0–17.0)
MCH: 31.3 pg (ref 26.0–34.0)
MCHC: 35.3 g/dL (ref 30.0–36.0)
MCV: 88.7 fL (ref 80.0–100.0)
Platelets: 159 10*3/uL (ref 150–400)
RBC: 4.5 MIL/uL (ref 4.22–5.81)
RDW: 12.9 % (ref 11.5–15.5)
WBC: 12.8 10*3/uL — ABNORMAL HIGH (ref 4.0–10.5)
nRBC: 0 % (ref 0.0–0.2)

## 2021-08-07 MED ORDER — OXYCODONE-ACETAMINOPHEN 5-325 MG PO TABS: 1.0000 | ORAL_TABLET | ORAL | 0 refills | Status: AC | PRN

## 2021-08-07 MED ORDER — PANTOPRAZOLE SODIUM 40 MG PO TBEC: 40.0000 mg | DELAYED_RELEASE_TABLET | Freq: Every day | ORAL | Status: DC

## 2021-08-07 NOTE — Discharge Summary (Signed)
?  Patient ID: ?Timothy Cameron ?MRN: [id number] ?DOB/AGE: December 11, 1965 56 y.o. ? ?Admit date: 08/06/2021 ?Discharge date: 08/07/2021 ? ? ?Discharge Diagnoses:  ?Principal Problem: ?  Ileostomy status ([facility]) ? ? ?Procedures: Ileostomy reversal ? ?Hospital Course: Patient with ileostomy in place.  He had ileostomy reversal.  He has been ambulating, pain control, he is tolerating soft diet, he had a bowel movement and is passing gas.  The wound is dry and clean. ? ?Physical Exam ?HENT:  ?   Head: Normocephalic.  ?Cardiovascular:  ?   Rate and Rhythm: Normal rate.  ?   Pulses: Normal pulses.  ?Pulmonary:  ?   Effort: Pulmonary effort is normal.  ?Abdominal:  ?   General: Abdomen is flat. Bowel sounds are normal.  ?   Palpations: Abdomen is soft.  ?Skin: ?   Capillary Refill: Capillary refill takes less than 2 seconds.  ?Neurological:  ?   Mental Status: He is alert and oriented to person, place, and time.  ? ? ? ?Consults: None ? ?Disposition: Discharge disposition: 01-Home or Self Care ? ? ? ? ? ? ?Discharge Instructions   ? ? Diet - low sodium heart healthy   Complete by: As directed ?  ? Increase activity slowly   Complete by: As directed ?  ? ?  ? ?Allergies as of 08/07/2021   ?No Known Allergies ?  ? ?  ?Medication List  ?  ? ?TAKE these medications   ? ?atorvastatin 20 MG tablet ?Commonly known as: LIPITOR ?Take 20 mg by mouth at bedtime. ?  ?glipiZIDE 10 MG 24 hr tablet ?Commonly known as: GLUCOTROL XL ?Take 10 mg by mouth in the morning and at bedtime. ?  ?hydrochlorothiazide 25 MG tablet ?Commonly known as: HYDRODIURIL ?Take 25 mg by mouth at bedtime. ?  ?ibuprofen 200 MG tablet ?Commonly known as: ADVIL ?Take 800 mg by mouth every 8 (eight) hours as needed for moderate pain. ?  ?losartan 50 MG tablet ?Commonly known as: COZAAR ?Take 50 mg by mouth at bedtime. ?  ?multivitamin with minerals Tabs tablet ?Take 2 tablets by mouth daily. ?  ?omeprazole 20 MG capsule ?Commonly known as: PRILOSEC ?Take 20 mg by mouth at  bedtime. ?  ?oxyCODONE-acetaminophen 5-325 MG tablet ?Commonly known as: Percocet ?Take 1 tablet by mouth every 4 (four) hours as needed for severe pain. ?  ? ?  ? ? Follow-up Information   ? ? Carolan Shiver, MD Follow up in 2 week(s).   ?Specialty: General Surgery ?Why: Follow up after ileostomy takedown ?Contact information: ?[address] ?[place] [address] ?[phone] ? ? ?  ?  ? ?  ?  ? ?  ? ? ? ?

## 2021-08-07 NOTE — Progress Notes (Signed)
Pt discharged per MD order. IV removed. Discharged orders reviewed with pt. Pt verbalized understanding. All questions answered to pt satisfaction. Pt taken to car in wheelchair by volunteer.  ?

## 2021-08-07 NOTE — Progress Notes (Signed)
Mobility Specialist - Progress Note ? ? 08/07/21 1500  ?Mobility  ?Activity Ambulated independently in hallway  ?Level of Assistance Independent  ?Assistive Device None  ?Distance Ambulated (ft) 160 ft  ?Activity Response Tolerated well  ?$Mobility charge 1 Mobility  ? ? ? ?Pt ambulated hallway independently. No complaints. ? ? ?Timothy Cameron ?Mobility Specialist ?08/07/21, 3:34 PM ? ? ? ? ?

## 2021-08-07 NOTE — Discharge Instructions (Signed)

## 2021-08-07 NOTE — Progress Notes (Signed)
PHARMACIST - PHYSICIAN COMMUNICATION ? ?CONCERNING: IV to Oral Route Change Policy ? ?RECOMMENDATION: ?This patient is receiving pantoprazole by the intravenous route.  Based on criteria approved by the Pharmacy and Therapeutics Committee, the intravenous medication(s) is/are being converted to the equivalent oral dose form(s). ? ? ?DESCRIPTION: ?These criteria include: ?The patient is eating (either orally or via tube) and/or has been taking other orally administered medications for a least 24 hours ?The patient has no evidence of active gastrointestinal bleeding or impaired GI absorption (gastrectomy, short bowel, patient on TNA or NPO). ? ?If you have questions about this conversion, please contact the Pharmacy Department  ? ?Tressie Ellis, RPH ?08/07/2021 7:26 AM  ?

## 2021-08-08 LAB — SURGICAL PATHOLOGY

## 2021-11-13 DIAGNOSIS — E1169 Type 2 diabetes mellitus with other specified complication: Secondary | ICD-10-CM | POA: Diagnosis not present

## 2021-11-13 DIAGNOSIS — E785 Hyperlipidemia, unspecified: Secondary | ICD-10-CM | POA: Diagnosis not present

## 2021-11-13 DIAGNOSIS — I159 Secondary hypertension, unspecified: Secondary | ICD-10-CM | POA: Diagnosis not present

## 2022-01-17 DIAGNOSIS — I159 Secondary hypertension, unspecified: Secondary | ICD-10-CM | POA: Diagnosis not present

## 2022-01-17 DIAGNOSIS — R062 Wheezing: Secondary | ICD-10-CM | POA: Diagnosis not present

## 2022-01-17 DIAGNOSIS — E1169 Type 2 diabetes mellitus with other specified complication: Secondary | ICD-10-CM | POA: Diagnosis not present

## 2022-01-17 DIAGNOSIS — R0981 Nasal congestion: Secondary | ICD-10-CM | POA: Diagnosis not present

## 2022-01-17 DIAGNOSIS — Z03818 Encounter for observation for suspected exposure to other biological agents ruled out: Secondary | ICD-10-CM | POA: Diagnosis not present

## 2022-01-17 DIAGNOSIS — E785 Hyperlipidemia, unspecified: Secondary | ICD-10-CM | POA: Diagnosis not present

## 2022-01-17 DIAGNOSIS — H938X3 Other specified disorders of ear, bilateral: Secondary | ICD-10-CM | POA: Diagnosis not present

## 2022-01-18 ENCOUNTER — Other Ambulatory Visit: Payer: Self-pay | Admitting: Family Medicine

## 2022-01-18 DIAGNOSIS — R911 Solitary pulmonary nodule: Secondary | ICD-10-CM

## 2022-01-24 ENCOUNTER — Ambulatory Visit
Admission: RE | Admit: 2022-01-24 | Discharge: 2022-01-24 | Disposition: A | Discharge status: Home / Self Care | Payer: 59 | Source: Ambulatory Visit | Attending: Family Medicine | Admitting: Family Medicine

## 2022-01-24 DIAGNOSIS — J984 Other disorders of lung: Secondary | ICD-10-CM | POA: Diagnosis not present

## 2022-01-24 DIAGNOSIS — R918 Other nonspecific abnormal finding of lung field: Secondary | ICD-10-CM | POA: Diagnosis not present

## 2022-01-24 DIAGNOSIS — R911 Solitary pulmonary nodule: Secondary | ICD-10-CM

## 2022-01-24 MED ORDER — IOPAMIDOL (ISOVUE-300) INJECTION 61%
75.0000 mL | Freq: Once | INTRAVENOUS | Status: AC | PRN
  Administered 2022-01-24: 75 mL via INTRAVENOUS

## 2022-02-02 DIAGNOSIS — E785 Hyperlipidemia, unspecified: Secondary | ICD-10-CM | POA: Diagnosis not present

## 2022-02-02 DIAGNOSIS — N529 Male erectile dysfunction, unspecified: Secondary | ICD-10-CM | POA: Diagnosis not present

## 2022-02-02 DIAGNOSIS — Z7985 Long-term (current) use of injectable non-insulin antidiabetic drugs: Secondary | ICD-10-CM | POA: Diagnosis not present

## 2022-02-02 DIAGNOSIS — E669 Obesity, unspecified: Secondary | ICD-10-CM | POA: Diagnosis not present

## 2022-02-02 DIAGNOSIS — R69 Illness, unspecified: Secondary | ICD-10-CM | POA: Diagnosis not present

## 2022-02-02 DIAGNOSIS — Z7984 Long term (current) use of oral hypoglycemic drugs: Secondary | ICD-10-CM | POA: Diagnosis not present

## 2022-02-02 DIAGNOSIS — K219 Gastro-esophageal reflux disease without esophagitis: Secondary | ICD-10-CM | POA: Diagnosis not present

## 2022-02-02 DIAGNOSIS — K5900 Constipation, unspecified: Secondary | ICD-10-CM | POA: Diagnosis not present

## 2022-02-02 DIAGNOSIS — E119 Type 2 diabetes mellitus without complications: Secondary | ICD-10-CM | POA: Diagnosis not present

## 2022-02-02 DIAGNOSIS — Z6832 Body mass index (BMI) 32.0-32.9, adult: Secondary | ICD-10-CM | POA: Diagnosis not present

## 2022-02-02 DIAGNOSIS — Z803 Family history of malignant neoplasm of breast: Secondary | ICD-10-CM | POA: Diagnosis not present

## 2022-02-02 DIAGNOSIS — I10 Essential (primary) hypertension: Secondary | ICD-10-CM | POA: Diagnosis not present

## 2022-02-13 DIAGNOSIS — G4719 Other hypersomnia: Secondary | ICD-10-CM | POA: Diagnosis not present

## 2022-02-13 DIAGNOSIS — I159 Secondary hypertension, unspecified: Secondary | ICD-10-CM | POA: Diagnosis not present

## 2022-02-13 DIAGNOSIS — R0683 Snoring: Secondary | ICD-10-CM | POA: Diagnosis not present

## 2022-02-13 DIAGNOSIS — R5383 Other fatigue: Secondary | ICD-10-CM | POA: Diagnosis not present

## 2022-02-13 DIAGNOSIS — R002 Palpitations: Secondary | ICD-10-CM | POA: Diagnosis not present

## 2022-02-13 DIAGNOSIS — R0602 Shortness of breath: Secondary | ICD-10-CM | POA: Diagnosis not present

## 2022-02-13 DIAGNOSIS — E785 Hyperlipidemia, unspecified: Secondary | ICD-10-CM | POA: Diagnosis not present

## 2022-02-13 DIAGNOSIS — E1169 Type 2 diabetes mellitus with other specified complication: Secondary | ICD-10-CM | POA: Diagnosis not present

## 2022-03-19 DIAGNOSIS — R002 Palpitations: Secondary | ICD-10-CM | POA: Diagnosis not present

## 2022-03-19 DIAGNOSIS — I159 Secondary hypertension, unspecified: Secondary | ICD-10-CM | POA: Diagnosis not present

## 2022-03-19 DIAGNOSIS — R0602 Shortness of breath: Secondary | ICD-10-CM | POA: Diagnosis not present

## 2022-03-26 ENCOUNTER — Other Ambulatory Visit: Payer: Self-pay | Admitting: Internal Medicine

## 2022-03-26 DIAGNOSIS — R0602 Shortness of breath: Secondary | ICD-10-CM | POA: Diagnosis not present

## 2022-03-26 DIAGNOSIS — E1169 Type 2 diabetes mellitus with other specified complication: Secondary | ICD-10-CM | POA: Diagnosis not present

## 2022-03-26 DIAGNOSIS — R69 Illness, unspecified: Secondary | ICD-10-CM | POA: Diagnosis not present

## 2022-03-26 DIAGNOSIS — K219 Gastro-esophageal reflux disease without esophagitis: Secondary | ICD-10-CM | POA: Diagnosis not present

## 2022-03-26 DIAGNOSIS — E785 Hyperlipidemia, unspecified: Secondary | ICD-10-CM | POA: Diagnosis not present

## 2022-03-26 DIAGNOSIS — R079 Chest pain, unspecified: Secondary | ICD-10-CM | POA: Diagnosis not present

## 2022-03-26 DIAGNOSIS — I159 Secondary hypertension, unspecified: Secondary | ICD-10-CM | POA: Diagnosis not present

## 2022-03-26 DIAGNOSIS — J9811 Atelectasis: Secondary | ICD-10-CM | POA: Diagnosis not present

## 2022-03-26 DIAGNOSIS — E782 Mixed hyperlipidemia: Secondary | ICD-10-CM | POA: Diagnosis not present

## 2022-04-01 DIAGNOSIS — R5383 Other fatigue: Secondary | ICD-10-CM | POA: Diagnosis not present

## 2022-04-01 DIAGNOSIS — I159 Secondary hypertension, unspecified: Secondary | ICD-10-CM | POA: Diagnosis not present

## 2022-04-01 DIAGNOSIS — G4719 Other hypersomnia: Secondary | ICD-10-CM | POA: Diagnosis not present

## 2022-04-01 DIAGNOSIS — R0683 Snoring: Secondary | ICD-10-CM | POA: Diagnosis not present

## 2022-04-03 ENCOUNTER — Telehealth (HOSPITAL_COMMUNITY): Payer: Self-pay | Admitting: Emergency Medicine

## 2022-04-03 DIAGNOSIS — G4733 Obstructive sleep apnea (adult) (pediatric): Secondary | ICD-10-CM | POA: Diagnosis not present

## 2022-04-03 DIAGNOSIS — R079 Chest pain, unspecified: Secondary | ICD-10-CM

## 2022-04-03 MED ORDER — IVABRADINE HCL 5 MG PO TABS: 15.0000 mg | ORAL_TABLET | Freq: Once | ORAL | 0 refills | Status: AC

## 2022-04-03 MED ORDER — METOPROLOL TARTRATE 100 MG PO TABS: 100.0000 mg | ORAL_TABLET | Freq: Once | ORAL | 0 refills | Status: AC

## 2022-04-03 NOTE — Telephone Encounter (Signed)
Unable to leave vm  [employer] Sent rx for metop and ivab to pharm on file  Rockwell Alexandria RN Navigator Cardiac Imaging [facility] [phone] Office  [phone] Cell

## 2022-04-03 NOTE — Telephone Encounter (Signed)
Reaching out to patient to offer assistance regarding upcoming cardiac imaging study; pt verbalizes understanding of appt date/time, parking situation and where to check in, pre-test NPO status and medications ordered, and verified current allergies; name and call back number provided for further questions should they arise Rockwell Alexandria RN Navigator Cardiac Imaging [facility] [phone] office [phone] cell  [facility] Instructed patient to pick up metop and ivab for appt tomorrow and take 2 hr prior to scan Denies iv issues Arrival 1200

## 2022-04-04 ENCOUNTER — Ambulatory Visit
Admission: RE | Admit: 2022-04-04 | Discharge: 2022-04-04 | Disposition: A | Discharge status: Home / Self Care | Payer: 59 | Source: Ambulatory Visit | Attending: Internal Medicine | Admitting: Internal Medicine

## 2022-04-04 DIAGNOSIS — R079 Chest pain, unspecified: Secondary | ICD-10-CM | POA: Diagnosis not present

## 2022-04-04 DIAGNOSIS — R0602 Shortness of breath: Secondary | ICD-10-CM | POA: Diagnosis present

## 2022-04-04 LAB — POCT I-STAT CREATININE
Creatinine, Ser: 1.1 mg/dL (ref 0.61–1.24)
Creatinine, Ser: 1.1 mg/dL (ref 0.61–1.24)
Creatinine, Ser: 1.1 mg/dL (ref 0.61–1.24)

## 2022-04-04 MED ORDER — IOHEXOL 350 MG/ML SOLN
100.0000 mL | Freq: Once | INTRAVENOUS | Status: AC | PRN
  Administered 2022-04-04: 100 mL via INTRAVENOUS

## 2022-04-04 MED ORDER — NITROGLYCERIN 0.4 MG SL SUBL
0.8000 mg | SUBLINGUAL_TABLET | Freq: Once | SUBLINGUAL | Status: AC
  Administered 2022-04-04: 0.8 mg via SUBLINGUAL

## 2022-04-04 NOTE — Progress Notes (Signed)
Patient tolerated procedure well. Ambulate w/o difficulty. Denies light headedness or being dizzy. Sitting in chair drinking water provided. Encouraged to drink extra water today and reasoning explained. Verbalized understanding. All questions answered. ABC intact. No further needs. Discharge from procedure area w/o issues.   °

## 2022-05-02 DIAGNOSIS — R079 Chest pain, unspecified: Secondary | ICD-10-CM | POA: Diagnosis not present

## 2022-05-02 DIAGNOSIS — R69 Illness, unspecified: Secondary | ICD-10-CM | POA: Diagnosis not present

## 2022-05-02 DIAGNOSIS — R0602 Shortness of breath: Secondary | ICD-10-CM | POA: Diagnosis not present

## 2022-05-02 DIAGNOSIS — E785 Hyperlipidemia, unspecified: Secondary | ICD-10-CM | POA: Diagnosis not present

## 2022-05-02 DIAGNOSIS — E1169 Type 2 diabetes mellitus with other specified complication: Secondary | ICD-10-CM | POA: Diagnosis not present

## 2022-05-02 DIAGNOSIS — K219 Gastro-esophageal reflux disease without esophagitis: Secondary | ICD-10-CM | POA: Diagnosis not present

## 2022-05-02 DIAGNOSIS — E782 Mixed hyperlipidemia: Secondary | ICD-10-CM | POA: Diagnosis not present

## 2022-05-13 DIAGNOSIS — G4733 Obstructive sleep apnea (adult) (pediatric): Secondary | ICD-10-CM | POA: Diagnosis not present

## 2022-05-27 DIAGNOSIS — G4733 Obstructive sleep apnea (adult) (pediatric): Secondary | ICD-10-CM | POA: Diagnosis not present

## 2022-06-13 DIAGNOSIS — G4733 Obstructive sleep apnea (adult) (pediatric): Secondary | ICD-10-CM | POA: Diagnosis not present

## 2022-06-25 DIAGNOSIS — G4733 Obstructive sleep apnea (adult) (pediatric): Secondary | ICD-10-CM | POA: Diagnosis not present

## 2022-07-10 DIAGNOSIS — G4733 Obstructive sleep apnea (adult) (pediatric): Secondary | ICD-10-CM | POA: Diagnosis not present

## 2022-07-12 DIAGNOSIS — G4733 Obstructive sleep apnea (adult) (pediatric): Secondary | ICD-10-CM | POA: Diagnosis not present

## 2022-08-06 DIAGNOSIS — B354 Tinea corporis: Secondary | ICD-10-CM | POA: Diagnosis not present

## 2022-08-06 DIAGNOSIS — E785 Hyperlipidemia, unspecified: Secondary | ICD-10-CM | POA: Diagnosis not present

## 2022-08-06 DIAGNOSIS — L989 Disorder of the skin and subcutaneous tissue, unspecified: Secondary | ICD-10-CM | POA: Diagnosis not present

## 2022-08-06 DIAGNOSIS — E1169 Type 2 diabetes mellitus with other specified complication: Secondary | ICD-10-CM | POA: Diagnosis not present

## 2022-08-06 DIAGNOSIS — L03119 Cellulitis of unspecified part of limb: Secondary | ICD-10-CM | POA: Diagnosis not present

## 2022-08-06 DIAGNOSIS — L02419 Cutaneous abscess of limb, unspecified: Secondary | ICD-10-CM | POA: Diagnosis not present

## 2022-08-12 DIAGNOSIS — G4733 Obstructive sleep apnea (adult) (pediatric): Secondary | ICD-10-CM | POA: Diagnosis not present

## 2022-08-26 DIAGNOSIS — G4733 Obstructive sleep apnea (adult) (pediatric): Secondary | ICD-10-CM | POA: Diagnosis not present

## 2022-09-09 DIAGNOSIS — G4733 Obstructive sleep apnea (adult) (pediatric): Secondary | ICD-10-CM | POA: Diagnosis not present

## 2022-09-11 DIAGNOSIS — G4733 Obstructive sleep apnea (adult) (pediatric): Secondary | ICD-10-CM | POA: Diagnosis not present

## 2022-10-12 DIAGNOSIS — G4733 Obstructive sleep apnea (adult) (pediatric): Secondary | ICD-10-CM | POA: Diagnosis not present

## 2022-10-15 DIAGNOSIS — E1169 Type 2 diabetes mellitus with other specified complication: Secondary | ICD-10-CM | POA: Diagnosis not present

## 2022-10-15 DIAGNOSIS — E785 Hyperlipidemia, unspecified: Secondary | ICD-10-CM | POA: Diagnosis not present

## 2022-10-15 DIAGNOSIS — B354 Tinea corporis: Secondary | ICD-10-CM | POA: Diagnosis not present

## 2022-10-28 DIAGNOSIS — M79672 Pain in left foot: Secondary | ICD-10-CM | POA: Diagnosis not present

## 2022-10-28 DIAGNOSIS — I159 Secondary hypertension, unspecified: Secondary | ICD-10-CM | POA: Diagnosis not present

## 2022-10-28 DIAGNOSIS — E119 Type 2 diabetes mellitus without complications: Secondary | ICD-10-CM | POA: Diagnosis not present

## 2022-10-28 DIAGNOSIS — M79671 Pain in right foot: Secondary | ICD-10-CM | POA: Diagnosis not present

## 2022-10-28 DIAGNOSIS — R208 Other disturbances of skin sensation: Secondary | ICD-10-CM | POA: Diagnosis not present

## 2022-11-07 ENCOUNTER — Encounter: Payer: 59 | Attending: Family Medicine | Admitting: Dietician

## 2022-11-07 ENCOUNTER — Encounter: Payer: Self-pay | Admitting: Dietician

## 2022-11-07 VITALS — Ht 71.0 in | Wt 220.9 lb

## 2022-11-07 DIAGNOSIS — E785 Hyperlipidemia, unspecified: Secondary | ICD-10-CM | POA: Diagnosis not present

## 2022-11-07 DIAGNOSIS — Z7984 Long term (current) use of oral hypoglycemic drugs: Secondary | ICD-10-CM | POA: Diagnosis not present

## 2022-11-07 DIAGNOSIS — E1169 Type 2 diabetes mellitus with other specified complication: Secondary | ICD-10-CM | POA: Insufficient documentation

## 2022-11-07 DIAGNOSIS — Z794 Long term (current) use of insulin: Secondary | ICD-10-CM | POA: Diagnosis not present

## 2022-11-07 NOTE — Progress Notes (Signed)
Diabetes Self-Management Education  Visit Type: First/Initial  Appt. Start Time: 1105 Appt. End Time: 1230  11/07/2022  Mr. Timothy Cameron, identified by name and date of birth, is a 57 y.o. male with a diagnosis of Diabetes: Type 2.   ASSESSMENT  Height 5\' 11"  (1.803 m), weight 220 lb 14.4 oz (100.2 kg). Body mass index is 30.81 kg/m.  Pt reports desire to improve DM and overall health, reports significant family history of DM including brother that has had BKA r/t complications. Pt reports neuropathy that can be very painful at times, states the pain intensifies as the day goes on, describes it as "burning", taking Gabapentin now. Pt reports taking Glipizide and Ozempic for DM, started Ozempic 3 weeks ago, formerly taking Trulicity. Pt reports checking blood sugar fasting ~2 times a week, ranges between 100 - 100 mg/dL recently. Pt requested CBG during visit, CBG taken - 170 mg/dL, pt ate a bag of pretzels prior to visit. Pt reports changing from regular sodas to diet, stopped adding sugar to coffee, and drinking more water (5-6 bottles a day), and lowered the amount of sugar in their sweet tea. Pt reports changing from [employer] to [employer]. 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 Pt reports feeling a little less motivated recently, more tired. Pt reports increased frequency poor sleep recently, wakes up to urinate a few times at night, especially on days having beer.     Diabetes Self-Management Education - 11/07/22 1127       Visit Information   Visit Type First/Initial      Initial Visit   Diabetes Type Type 2    Date Diagnosed 2017    Are you currently following a meal plan? No    Are you taking your medications as prescribed? Yes      Health Coping   How would you rate your overall health? Fair      Psychosocial Assessment   Patient Belief/Attitude about Diabetes Motivated to manage diabetes    What is the hardest part about your  diabetes right now, causing you the most concern, or is the most worrisome to you about your diabetes?   Making healty food and beverage choices    Self-care barriers None    Self-management support Doctor's office;Family    Other persons present Patient    Patient Concerns Nutrition/Meal planning;Healthy Lifestyle;Weight Control;Glycemic Control    Special Needs None    Preferred Learning Style Other (comment)    Learning Readiness Ready    How often do you need to have someone help you when you read instructions, pamphlets, or other written materials from your doctor or pharmacy? 1 - Never    What is the last grade level you completed in school? 10th grade      Pre-Education Assessment   Patient understands the diabetes disease and treatment process. Needs Instruction    Patient understands incorporating nutritional management into lifestyle. Needs Instruction    Patient undertands incorporating physical activity into lifestyle. Needs Instruction    Patient understands using medications safely. Needs Instruction    Patient understands monitoring blood glucose, interpreting and using results Needs Instruction    Patient understands prevention, detection, and treatment of acute complications. Needs Instruction    Patient understands prevention, detection, and treatment of chronic complications. Needs Instruction    Patient understands how to develop strategies to address psychosocial issues. Needs Instruction    Patient understands how to develop strategies to promote health/change behavior.  Needs Instruction      Complications   Last HgB A1C per patient/outside source 8.3 %   10/15/2022   How often do you check your blood sugar? 1-2 times/day    Fasting Blood glucose range (mg/dL) 40-981    Postprandial Blood glucose range (mg/dL) 191-478    Number of hyperglycemic episodes ( >200mg /dL): Occasional    Can you tell when your blood sugar is high? Yes    What do you do if your blood sugar  is high? drink water    Have you had a dilated eye exam in the past 12 months? No    Have you had a dental exam in the past 12 months? No    Are you checking your feet? Yes    How many days per week are you checking your feet? 5      Dietary Intake   Breakfast 3 scrambled eggs, cheese, 4 strips bacon, water    Lunch Half Grilled chicken salad, diet pepsi, [employer]    Dinner Chili beans, sweet tea    Snack (evening) 6 Bus[employer] lights    Beverage(s) Water, diet pepsi, sweet tea, beers      Activity / Exercise   Activity / Exercise Type ADL's    How many days per week do you exercise? 0    How many minutes per day do you exercise? 0    Total minutes per week of exercise 0      Patient Education   Previous Diabetes Education Yes (please comment)   Unsure as to when/what   Disease Pathophysiology Definition of diabetes, type 1 and 2, and the diagnosis of diabetes;Factors that contribute to the development of diabetes;Explored patient's options for treatment of their diabetes    Healthy Eating Role of diet in the treatment of diabetes and the relationship between the three main macronutrients and blood glucose level;Plate Method;Effects of alcohol on blood glucose and safety factors with consumption of alcohol.    Being Active Role of exercise on diabetes management, blood pressure control and cardiac health.    Medications Reviewed patients medication for diabetes, action, purpose, timing of dose and side effects.    Monitoring Identified appropriate SMBG and/or A1C goals.    Acute complications Taught prevention, symptoms, and  treatment of hypoglycemia - the 15 rule.;Discussed and identified patients' prevention, symptoms, and treatment of hyperglycemia.    Chronic complications Assessed and discussed foot care and prevention of foot problems;Relationship between chronic complications and blood glucose control;Retinopathy and reason for yearly dilated eye exams;Identified and discussed with  patient  current chronic complications;Lipid levels, blood glucose control and heart disease    Diabetes Stress and Support Identified and addressed patients feelings and concerns about diabetes;Worked with patient to identify barriers to care and solutions    Lifestyle and Health Coping Lifestyle issues that need to be addressed for better diabetes care      Individualized Goals (developed by patient)   Nutrition Follow meal plan discussed;General guidelines for healthy choices and portions discussed    Physical Activity Exercise 3-5 times per week   walk in the morning   Medications take my medication as prescribed    Monitoring  Test my blood glucose as discussed    Problem Solving Eating Pattern    Reducing Risk examine blood glucose patterns;do foot checks daily      Post-Education Assessment   Patient understands the diabetes disease and treatment process. Needs Review    Patient understands incorporating nutritional management  into lifestyle. Needs Review    Patient undertands incorporating physical activity into lifestyle. Needs Review    Patient understands using medications safely. Comphrehends key points    Patient understands monitoring blood glucose, interpreting and using results Comprehends key points    Patient understands prevention, detection, and treatment of acute complications. Comprehends key points    Patient understands prevention, detection, and treatment of chronic complications. Needs Review   Neuropathy   Patient understands how to develop strategies to address psychosocial issues. Needs Review    Patient understands how to develop strategies to promote health/change behavior. Needs Review      Outcomes   Expected Outcomes Demonstrated interest in learning. Expect positive outcomes    Future DMSE 4-6 wks    Program Status Not Completed             Individualized Plan for Diabetes Self-Management Training:   Learning Objective:  Patient will have a  greater understanding of diabetes self-management. Patient education plan is to attend individual and/or group sessions per assessed needs and concerns.   Plan:   Patient Instructions  Consider taking a walk in the morning when your feet don't hurt. Remember, that activity lowers your blood sugar every time.  Choose fruits for a snack or an addition to a meal. Have no more than 2-3 servings per day. 1 serving of fruit is about the size of a tennis ball.  Continue eating three meals a day, about 5-6 hours apart!  Begin to recognize carbohydrates, proteins, and non-starchy vegetables in your food choices!  Begin to build your meals using the proportions of the Balanced Plate. First, select your carb choice(s) for the meal. Make this 25% of your meal. Next, select your source of protein to pair with your carb choice(s). Make this another 25% of your meal. Finally, complete your meal with a variety of non-starchy vegetables. Make this the remaining 50% of your meal.   Expected Outcomes:  Demonstrated interest in learning. Expect positive outcomes  Education material provided: My Plate  If problems or questions, patient to contact team via:  Phone and Email  Future DSME appointment: 4-6 wks

## 2022-11-07 NOTE — Patient Instructions (Addendum)
Consider taking a walk in the morning when your feet don't hurt. Remember, that activity lowers your blood sugar every time.  Choose fruits for a snack or an addition to a meal. Have no more than 2-3 servings per day. 1 serving of fruit is about the size of a tennis ball.  Continue eating three meals a day, about 5-6 hours apart!  Begin to recognize carbohydrates, proteins, and non-starchy vegetables in your food choices!  Begin to build your meals using the proportions of the Balanced Plate. First, select your carb choice(s) for the meal. Make this 25% of your meal. Next, select your source of protein to pair with your carb choice(s). Make this another 25% of your meal. Finally, complete your meal with a variety of non-starchy vegetables. Make this the remaining 50% of your meal.

## 2022-11-11 DIAGNOSIS — G4733 Obstructive sleep apnea (adult) (pediatric): Secondary | ICD-10-CM | POA: Diagnosis not present

## 2022-11-29 DIAGNOSIS — G4733 Obstructive sleep apnea (adult) (pediatric): Secondary | ICD-10-CM | POA: Diagnosis not present

## 2022-12-12 DIAGNOSIS — G4733 Obstructive sleep apnea (adult) (pediatric): Secondary | ICD-10-CM | POA: Diagnosis not present

## 2022-12-20 ENCOUNTER — Ambulatory Visit: Payer: 59 | Admitting: Dietician

## 2022-12-30 DIAGNOSIS — G4733 Obstructive sleep apnea (adult) (pediatric): Secondary | ICD-10-CM | POA: Diagnosis not present

## 2023-01-12 DIAGNOSIS — G4733 Obstructive sleep apnea (adult) (pediatric): Secondary | ICD-10-CM | POA: Diagnosis not present

## 2023-05-01 DIAGNOSIS — I159 Secondary hypertension, unspecified: Secondary | ICD-10-CM | POA: Diagnosis not present

## 2023-05-01 DIAGNOSIS — E1169 Type 2 diabetes mellitus with other specified complication: Secondary | ICD-10-CM | POA: Diagnosis not present

## 2023-05-01 DIAGNOSIS — E785 Hyperlipidemia, unspecified: Secondary | ICD-10-CM | POA: Diagnosis not present

## 2023-05-01 DIAGNOSIS — F17200 Nicotine dependence, unspecified, uncomplicated: Secondary | ICD-10-CM | POA: Diagnosis not present

## 2023-05-01 DIAGNOSIS — R0602 Shortness of breath: Secondary | ICD-10-CM | POA: Diagnosis not present

## 2023-05-01 DIAGNOSIS — K219 Gastro-esophageal reflux disease without esophagitis: Secondary | ICD-10-CM | POA: Diagnosis not present

## 2023-05-01 DIAGNOSIS — E782 Mixed hyperlipidemia: Secondary | ICD-10-CM | POA: Diagnosis not present

## 2023-05-01 DIAGNOSIS — R079 Chest pain, unspecified: Secondary | ICD-10-CM | POA: Diagnosis not present

## 2023-05-28 DIAGNOSIS — G4733 Obstructive sleep apnea (adult) (pediatric): Secondary | ICD-10-CM | POA: Diagnosis not present

## 2023-06-06 DIAGNOSIS — E785 Hyperlipidemia, unspecified: Secondary | ICD-10-CM | POA: Diagnosis not present

## 2023-06-06 DIAGNOSIS — E1169 Type 2 diabetes mellitus with other specified complication: Secondary | ICD-10-CM | POA: Diagnosis not present

## 2023-06-06 DIAGNOSIS — Z0000 Encounter for general adult medical examination without abnormal findings: Secondary | ICD-10-CM | POA: Diagnosis not present

## 2023-06-10 DIAGNOSIS — E1169 Type 2 diabetes mellitus with other specified complication: Secondary | ICD-10-CM | POA: Diagnosis not present

## 2023-06-10 DIAGNOSIS — E785 Hyperlipidemia, unspecified: Secondary | ICD-10-CM | POA: Diagnosis not present

## 2023-06-10 DIAGNOSIS — R21 Rash and other nonspecific skin eruption: Secondary | ICD-10-CM | POA: Diagnosis not present

## 2023-06-10 DIAGNOSIS — Z125 Encounter for screening for malignant neoplasm of prostate: Secondary | ICD-10-CM | POA: Diagnosis not present

## 2023-06-10 DIAGNOSIS — I159 Secondary hypertension, unspecified: Secondary | ICD-10-CM | POA: Diagnosis not present

## 2023-06-10 DIAGNOSIS — Z0000 Encounter for general adult medical examination without abnormal findings: Secondary | ICD-10-CM | POA: Diagnosis not present

## 2023-06-10 DIAGNOSIS — R1310 Dysphagia, unspecified: Secondary | ICD-10-CM | POA: Diagnosis not present

## 2023-09-08 DIAGNOSIS — E785 Hyperlipidemia, unspecified: Secondary | ICD-10-CM | POA: Diagnosis not present

## 2023-09-08 DIAGNOSIS — L03115 Cellulitis of right lower limb: Secondary | ICD-10-CM | POA: Diagnosis not present

## 2023-09-08 DIAGNOSIS — E1169 Type 2 diabetes mellitus with other specified complication: Secondary | ICD-10-CM | POA: Diagnosis not present

## 2023-10-03 IMAGING — DX DG CHEST 1V PORT
1 series · 1 of 1 positions shown · non-contrast
Comparison: 11/20/2018, 04/25/2021

CLINICAL DATA: Short of breath, [id number] positive

EXAM:
PORTABLE CHEST 1 VIEW

[chest ap]
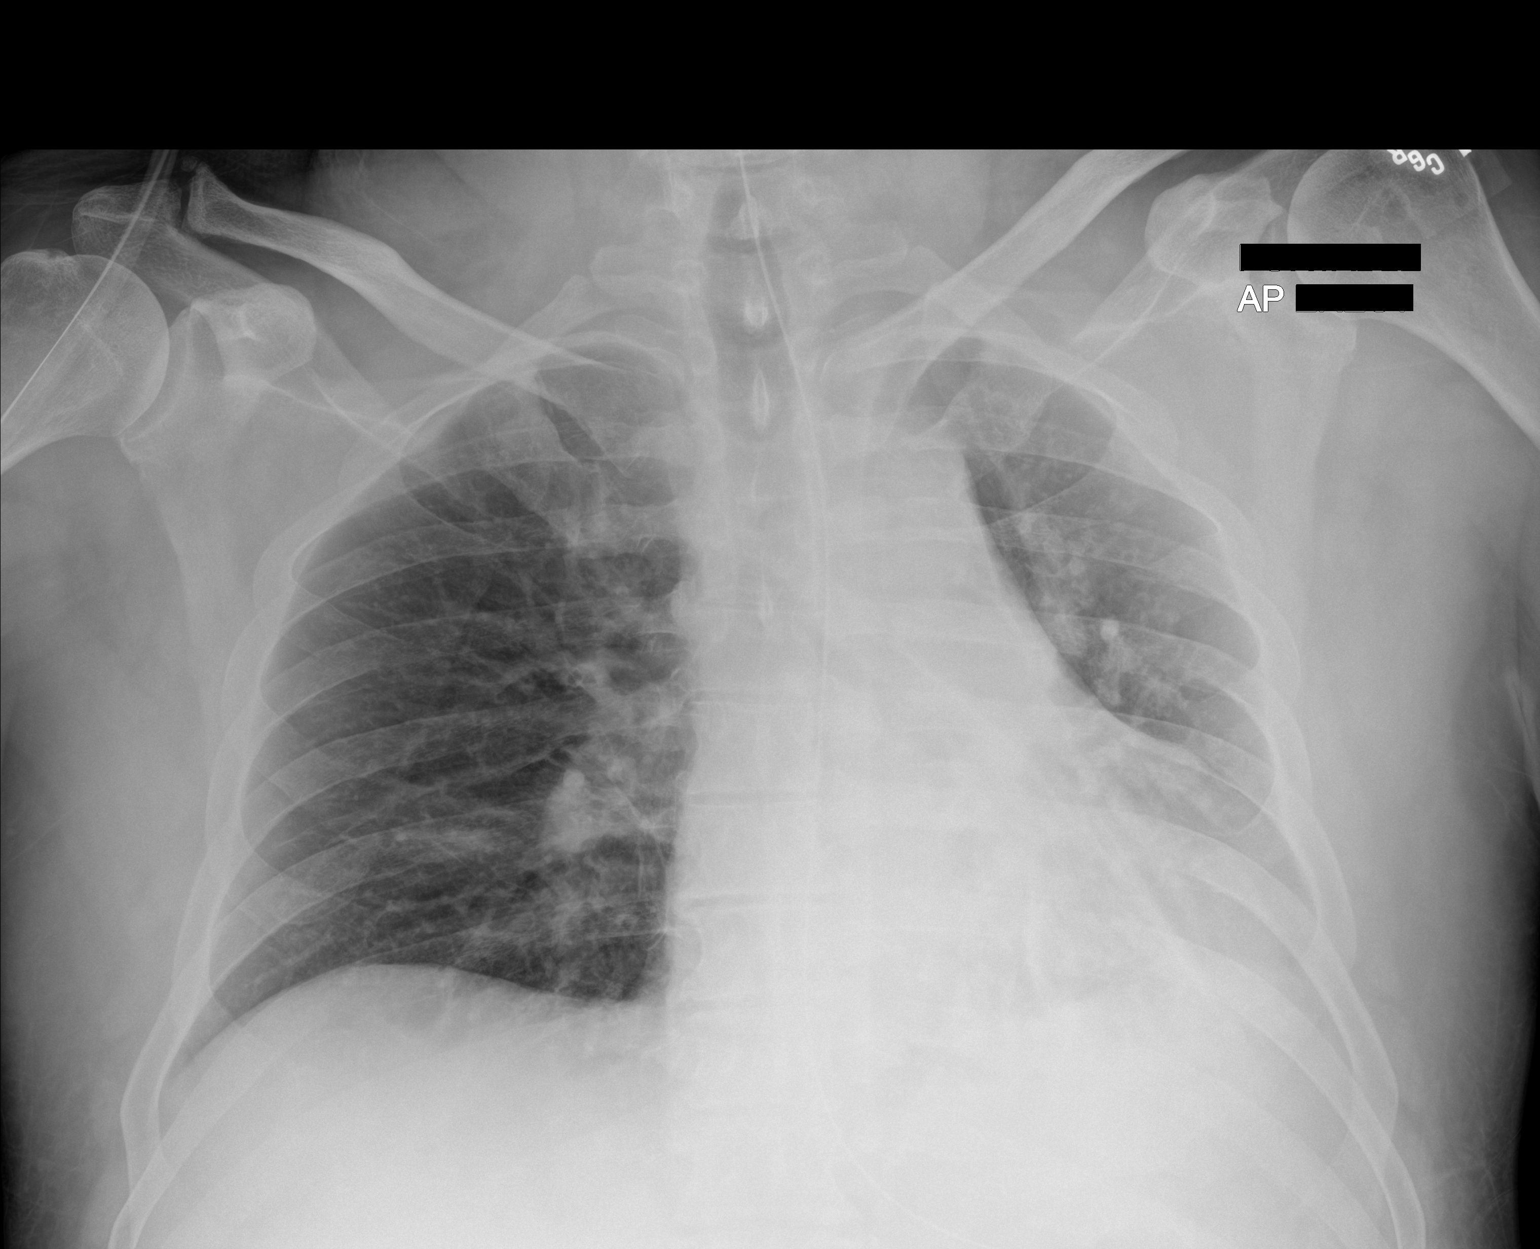

[1 of 1 positions shown; findings below may reference images not displayed]

FINDINGS: Single frontal view of the chest demonstrates enteric catheter
passing below diaphragm tip excluded by collimation. Cardiac
silhouette is stable. Increased consolidation at the left lung base
with associated volume loss consistent with atelectasis. Small left
pleural effusion is not excluded. No pneumothorax. No acute bony
abnormalities.
IMPRESSION: 1. Developing left basilar consolidation and associated volume loss,
consistent with atelectasis.
2. Enteric catheter, tip excluded by collimation.

## 2023-10-04 IMAGING — CT CT CHEST W/O CM
2 of 4 series · 15 of 36 positions shown, 18 images · non-contrast
Comparison: Chest radiograph done earlier today

CLINICAL DATA: Difficulty breathing, infiltrate left lung

EXAM:
CT CHEST WITHOUT CONTRAST
TECHNIQUE: Multidetector CT imaging of the chest was performed following the
standard protocol without IV contrast.

[Series 2: thorax · axial · 0.86mm/px · z∈[-672,-388]mm · 12 of 168 slices shown, 15 images]
[im 13/168  mediastinal]
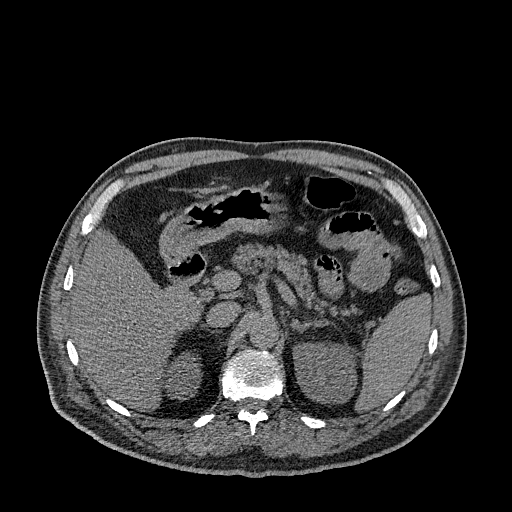
[im 13/168  lung]
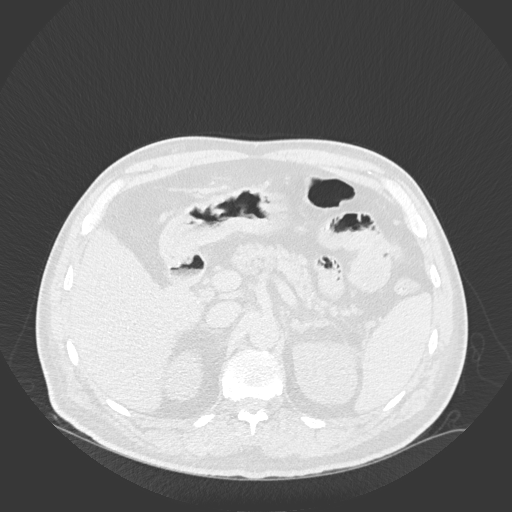
[im 26/168  lung]
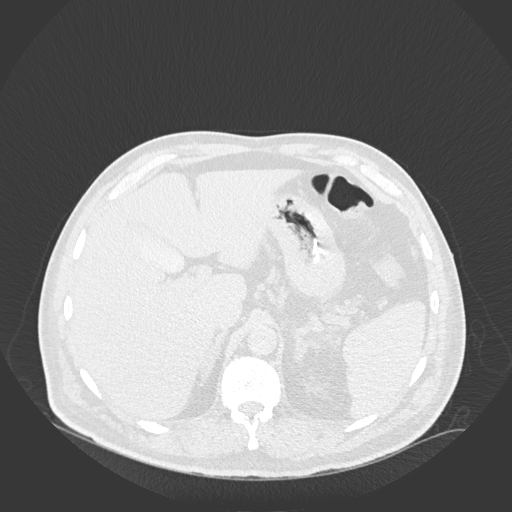
[im 39/168  lung]
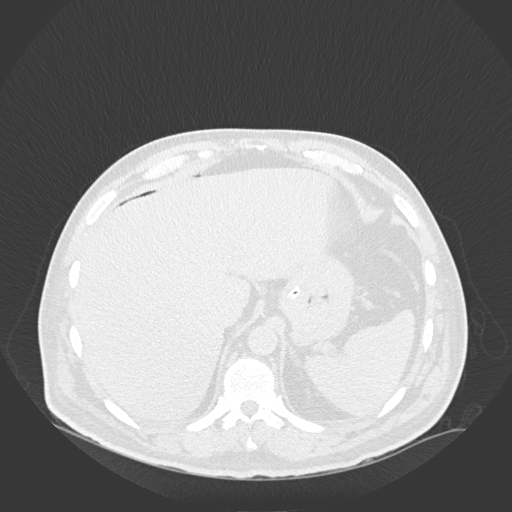
[im 52/168  lung]
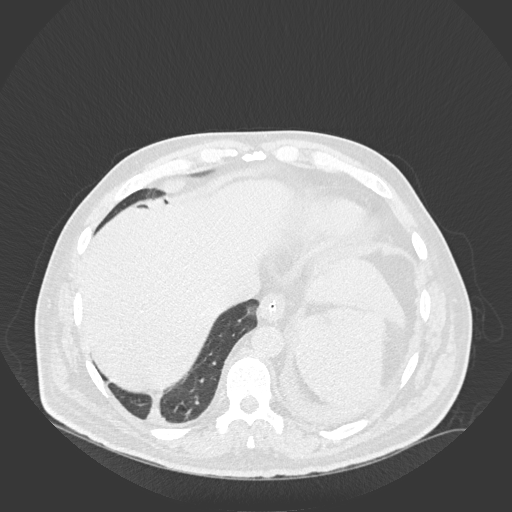
[im 65/168  mediastinal]
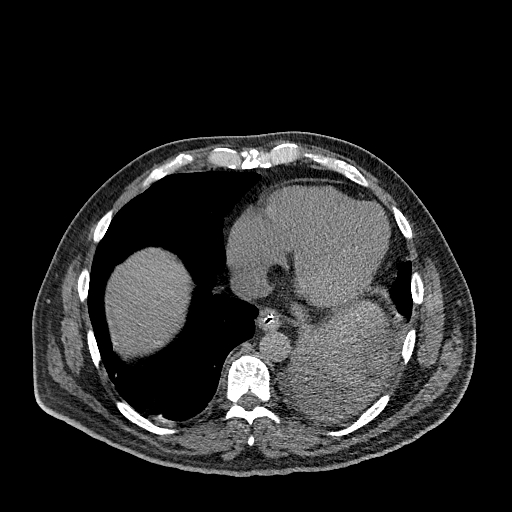
[im 65/168  lung]
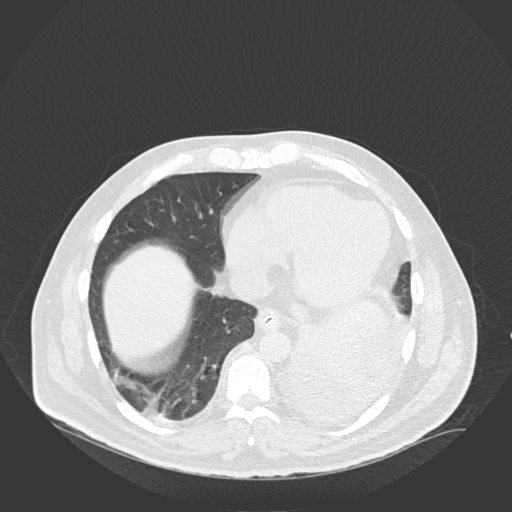
[im 78/168  lung]
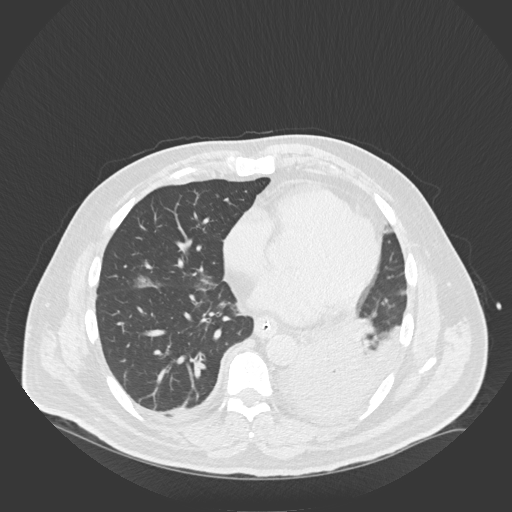
[im 90/168  lung]
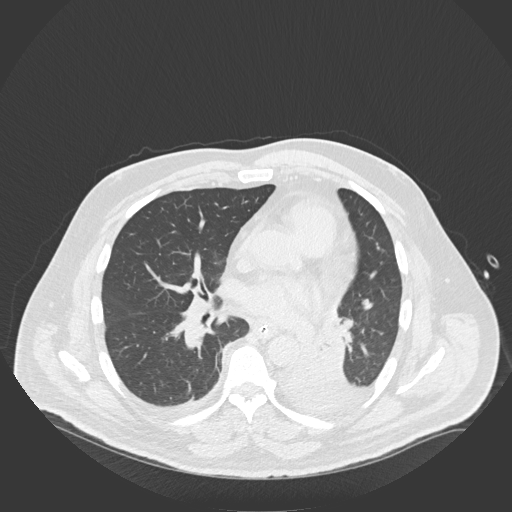
[im 103/168  lung]
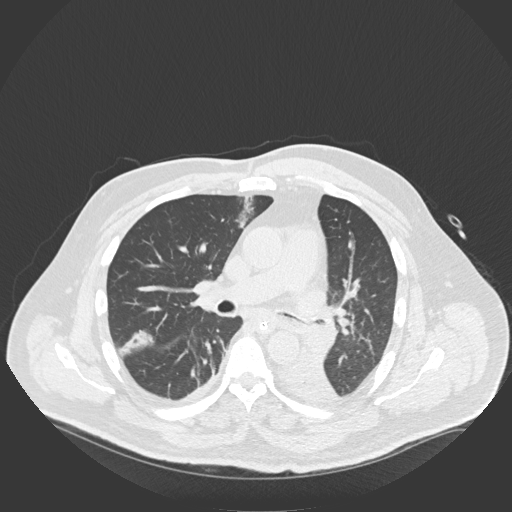
[im 116/168  mediastinal]
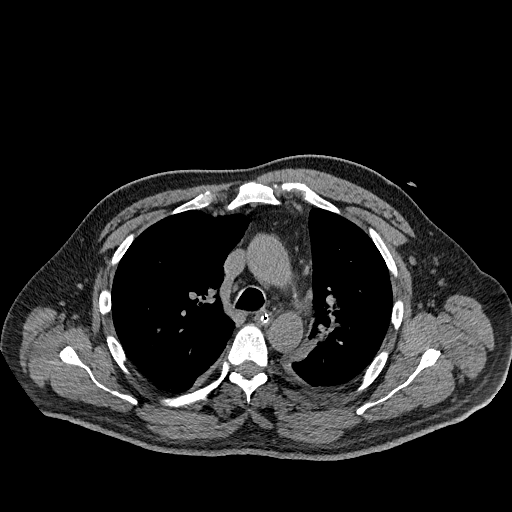
[im 116/168  lung]
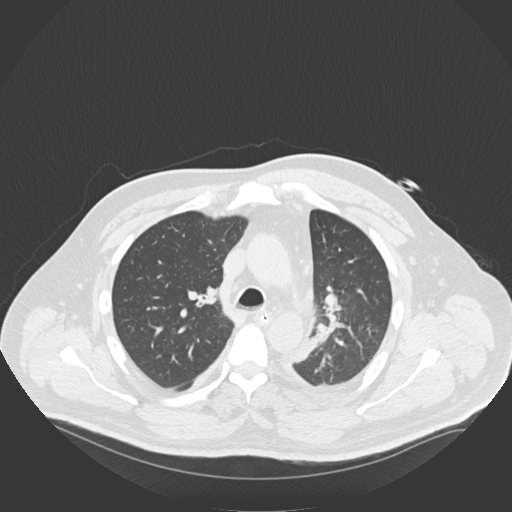
[im 129/168  lung]
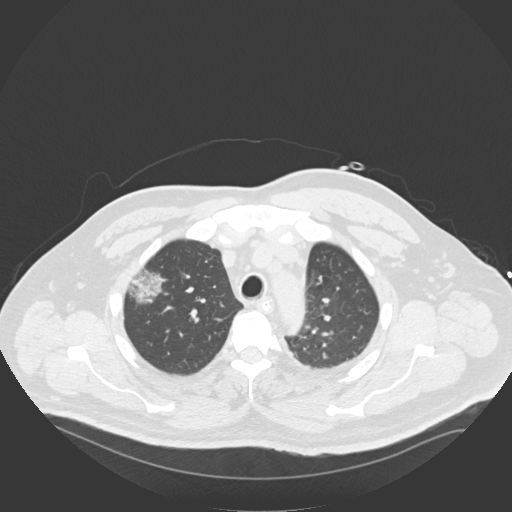
[im 142/168  lung]
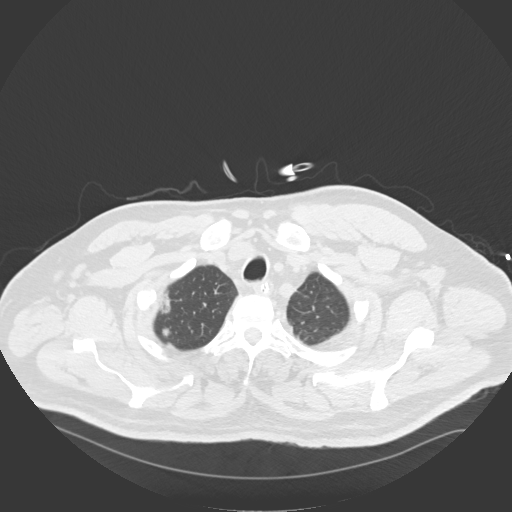
[im 155/168  lung]
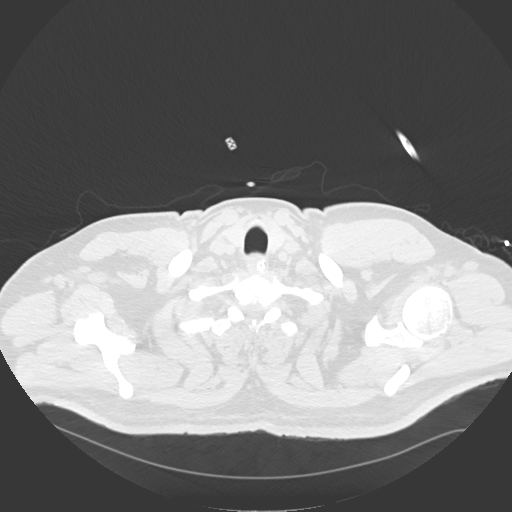

[Series 5: coronal · coronal · 0.67mm/px · 3 of 151 slices shown]
[im 31/151  lung]
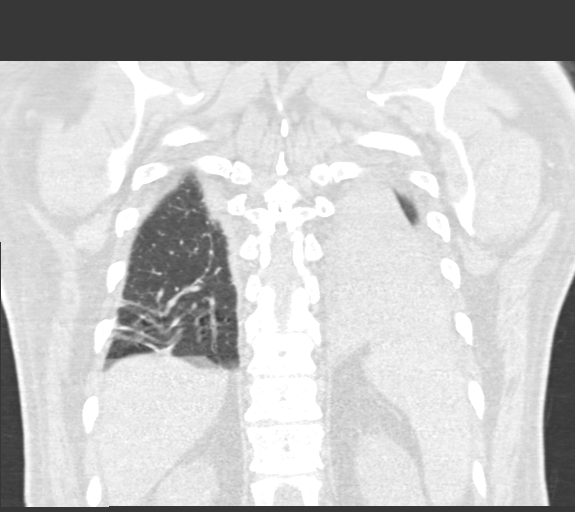
[im 61/151  lung]
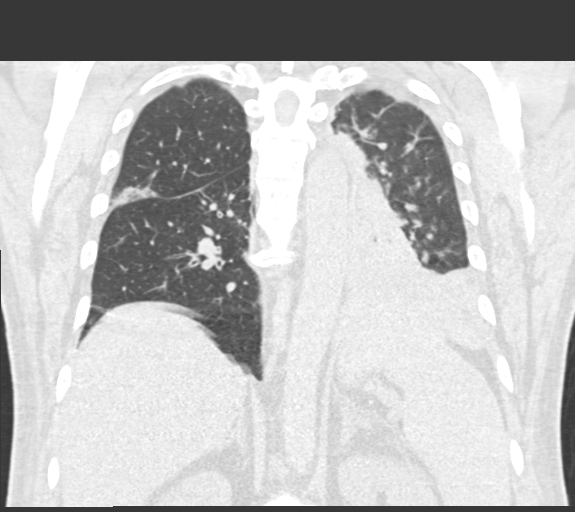
[im 91/151  lung]
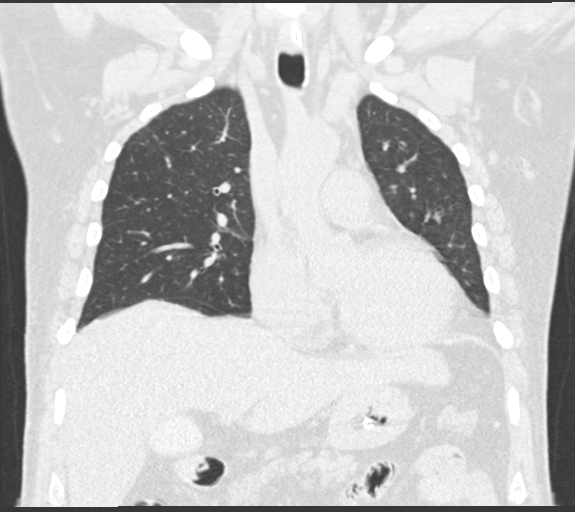

[15 of 36 positions shown; findings below may reference images not displayed]

FINDINGS: Cardiovascular: There are few tiny coronary artery calcifications.

Mediastinum/Nodes: No significant lymphadenopathy seen.

Lungs/Pleura: There is 3.6 cm focal ground-glass density in the
anterolateral aspect of right upper lobe. There are other smaller
foci of ground-glass densities in the medial right mid lung fields
and posterior segment of right upper lobe. There are small foci of
ground-glass infiltrates in the right middle lobe and right lower
lobe. There are linear densities in the posterior right lower lung
fields. There is complete atelectasis of left lower lobe. There are
filling defects in the left main bronchus. No significant focal
abnormality is seen in the left upper lobe. There is minimal left
pleural effusion. There is no pneumothorax.

Upper Abdomen: Tip of enteric tube is seen in the stomach. There is
small amount of pneumoperitoneum adjacent to the liver.
Pneumoperitoneum was also seen in the previous CT abdomen done on
04/25/2021. Increased density in the lumen of gallbladder may be
related to vicarious contrast excretion into the bile or suggest
presence of sludge or tiny stones. There is no wall thickening in
gallbladder.

Musculoskeletal: Unremarkable.
IMPRESSION: There is complete atelectasis in the left lower lobe, possibly due
to mucous plugging in the central bronchi. Small left pleural
effusion.

There are patchy ground-glass infiltrates in the right lung
suggesting possible multifocal pneumonia.

Small amount of pneumoperitoneum is noted adjacent to the liver
which may suggest recent abdominal surgery or suggest bowel
perforation.

Other findings as described in the body of the report.

## 2023-10-04 IMAGING — DX DG CHEST 1V PORT
1 series · 1 of 1 positions shown · non-contrast
Comparison: 04/26/2021 portable chest and earlier.

CLINICAL DATA: 55-year-old male positive [id number]. Shortness of
breath.

EXAM:
PORTABLE CHEST 1 VIEW

[chest ap]
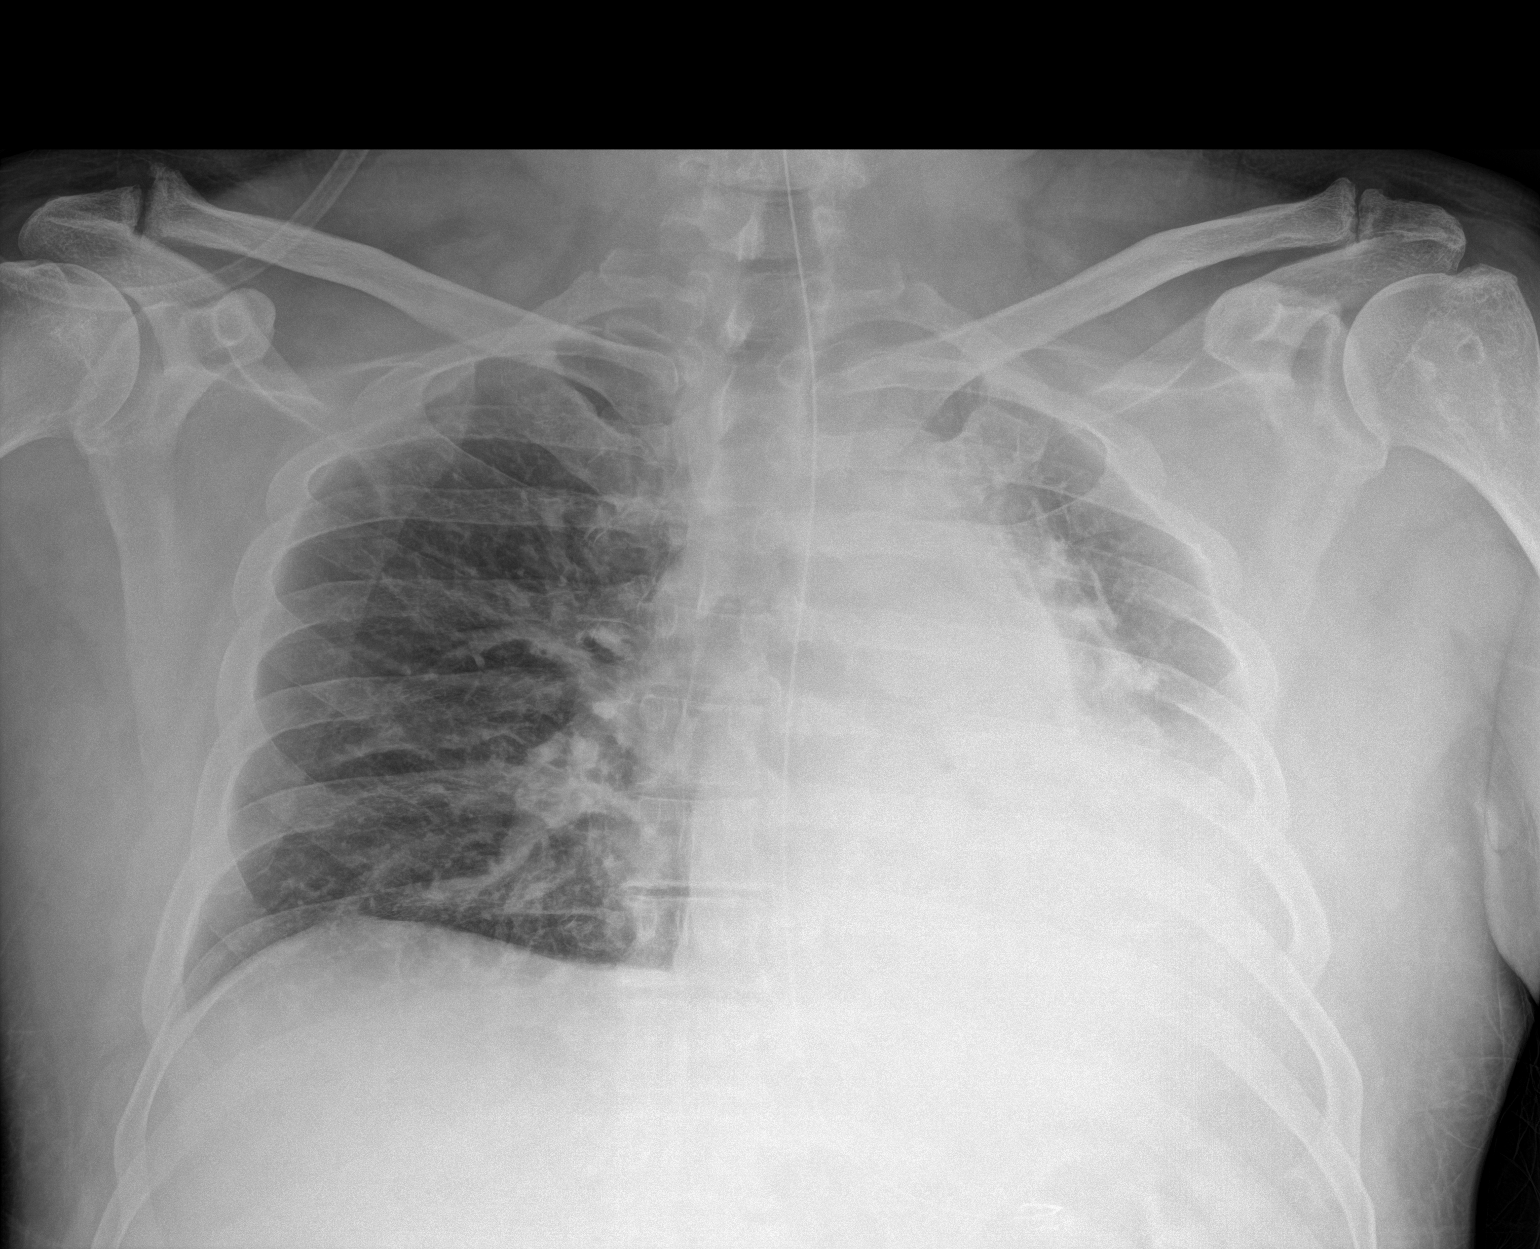

[1 of 1 positions shown; findings below may reference images not displayed]

FINDINGS: Portable AP semi upright view at 1647 hours. Stable enteric tube.
Mildly lower lung volumes and interval substantial opacification of
the left medial and lower lung. Some left hilar air bronchograms.
Some leftward shift of the mediastinum. Visible mediastinal contours
remain normal. Obscured left hemidiaphragm. No pneumothorax. Right
lung appears negative. No pleural effusion is evident. No acute
osseous abnormality identified.
IMPRESSION: 1. Moderate collapse and/or consolidation of the left lung since
04/26/2021.
[DATE]. Negative right lung.  Stable enteric tube.

## 2023-10-05 IMAGING — DX DG CHEST 1V PORT
1 series · 2 of 2 positions shown · non-contrast
Comparison: 04/27/2021 and older exams.

CLINICAL DATA: COVID positive patient. Follow-up left atelectasis.
Clinical symptoms improving.

EXAM:
PORTABLE CHEST 1 VIEW

[Series 1: chest ap · 0.14mm/px · 2 of 2 slices shown]
[im 1/2]
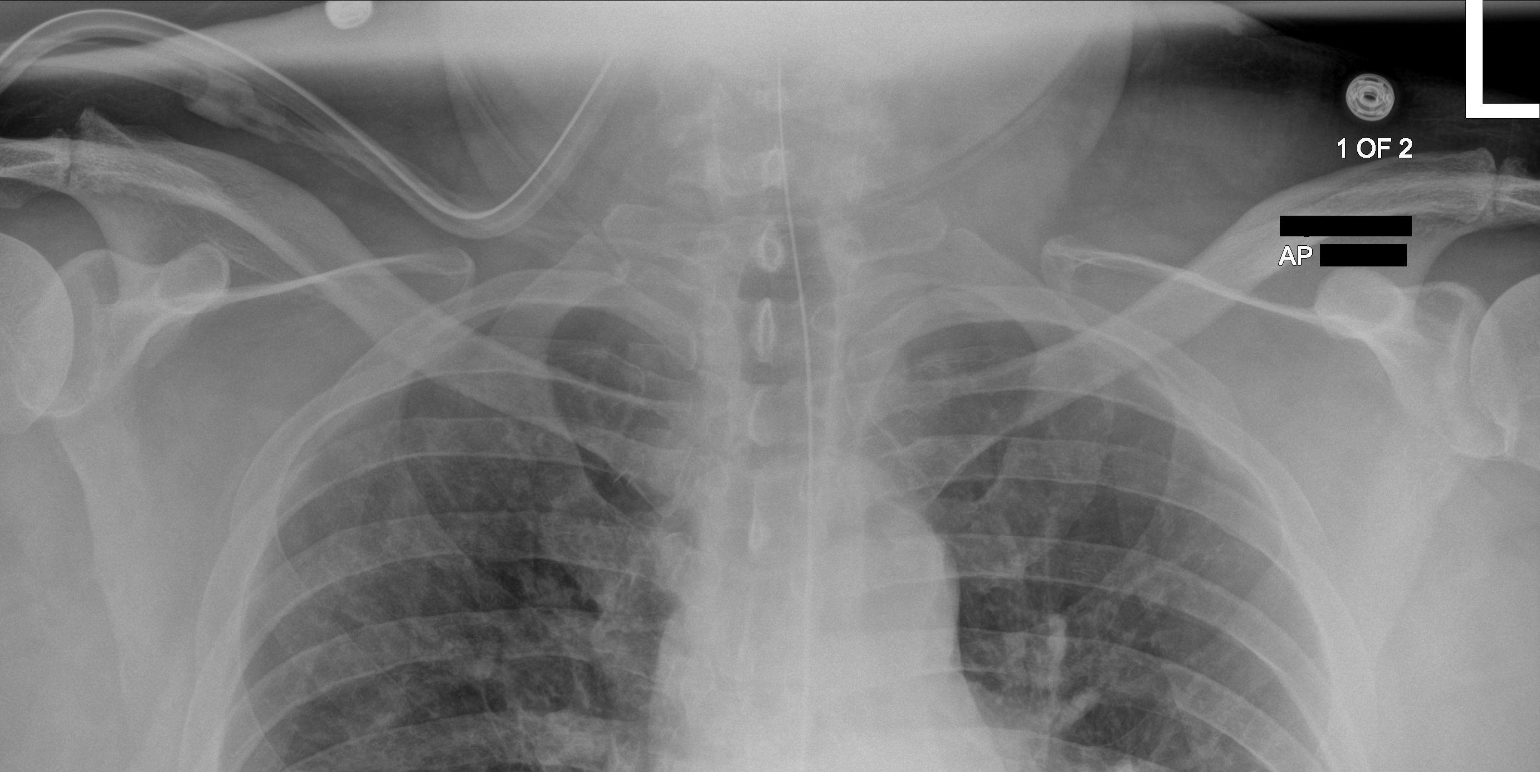
[im 2/2]
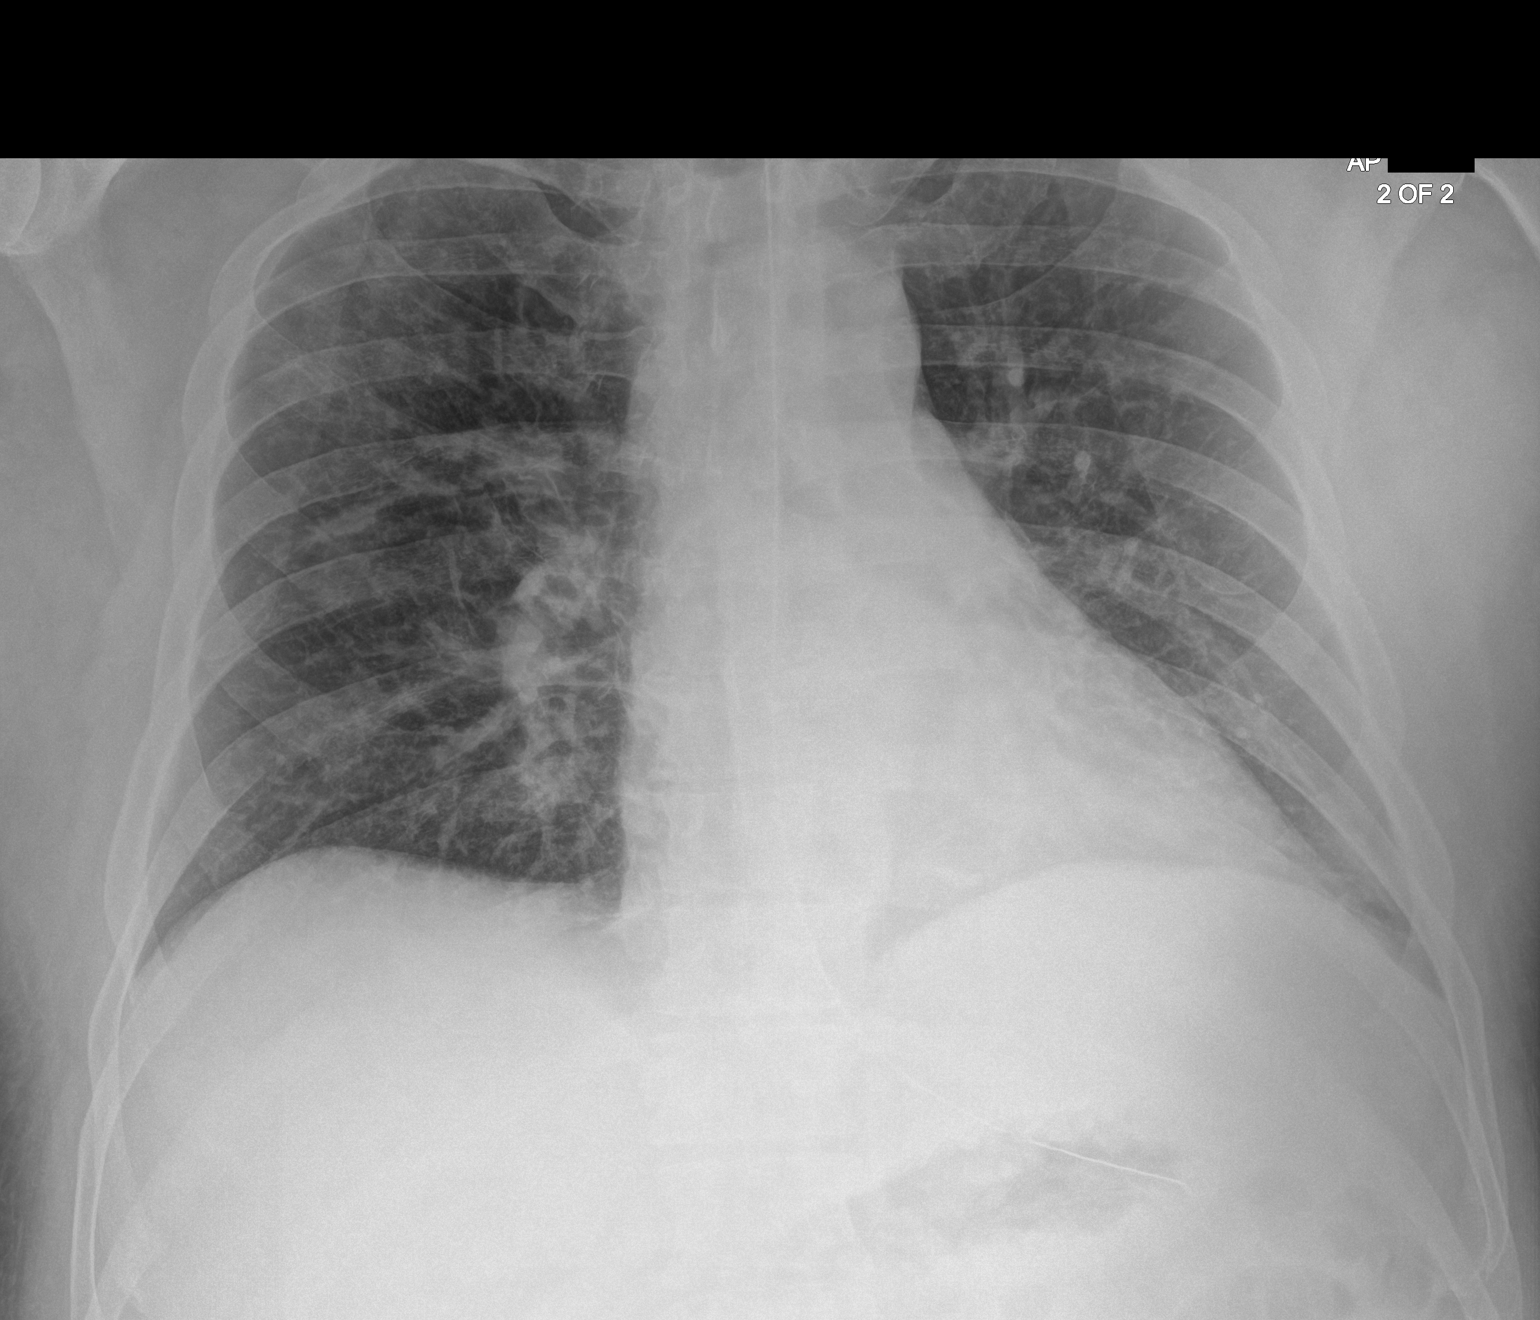

[2 of 2 positions shown; findings below may reference images not displayed]

FINDINGS: There has been a significant interval improvement in lung aeration,
with most of the previously seen left mid to lower lung opacity
resolving. There are subtle residual areas of opacity bilaterally.
No new lung abnormalities.

No convincing pleural effusion.  No pneumothorax.

Cardiac silhouette is normal in size. Normal mediastinal and hilar
contours.

Nasal/orogastric tube is stable, tip in the mid stomach.
IMPRESSION: 1. Significant interval improvement with near complete resolution of
the left mid to lower lung opacities.

## 2023-10-07 IMAGING — DX DG CHEST 1V PORT
1 series · 1 of 1 positions shown · non-contrast
Comparison: Radiograph 04/28/2021, chest CT 04/27/2021

CLINICAL DATA: Left lower lobe infiltrate

EXAM:
PORTABLE CHEST 1 VIEW

[chest ap]
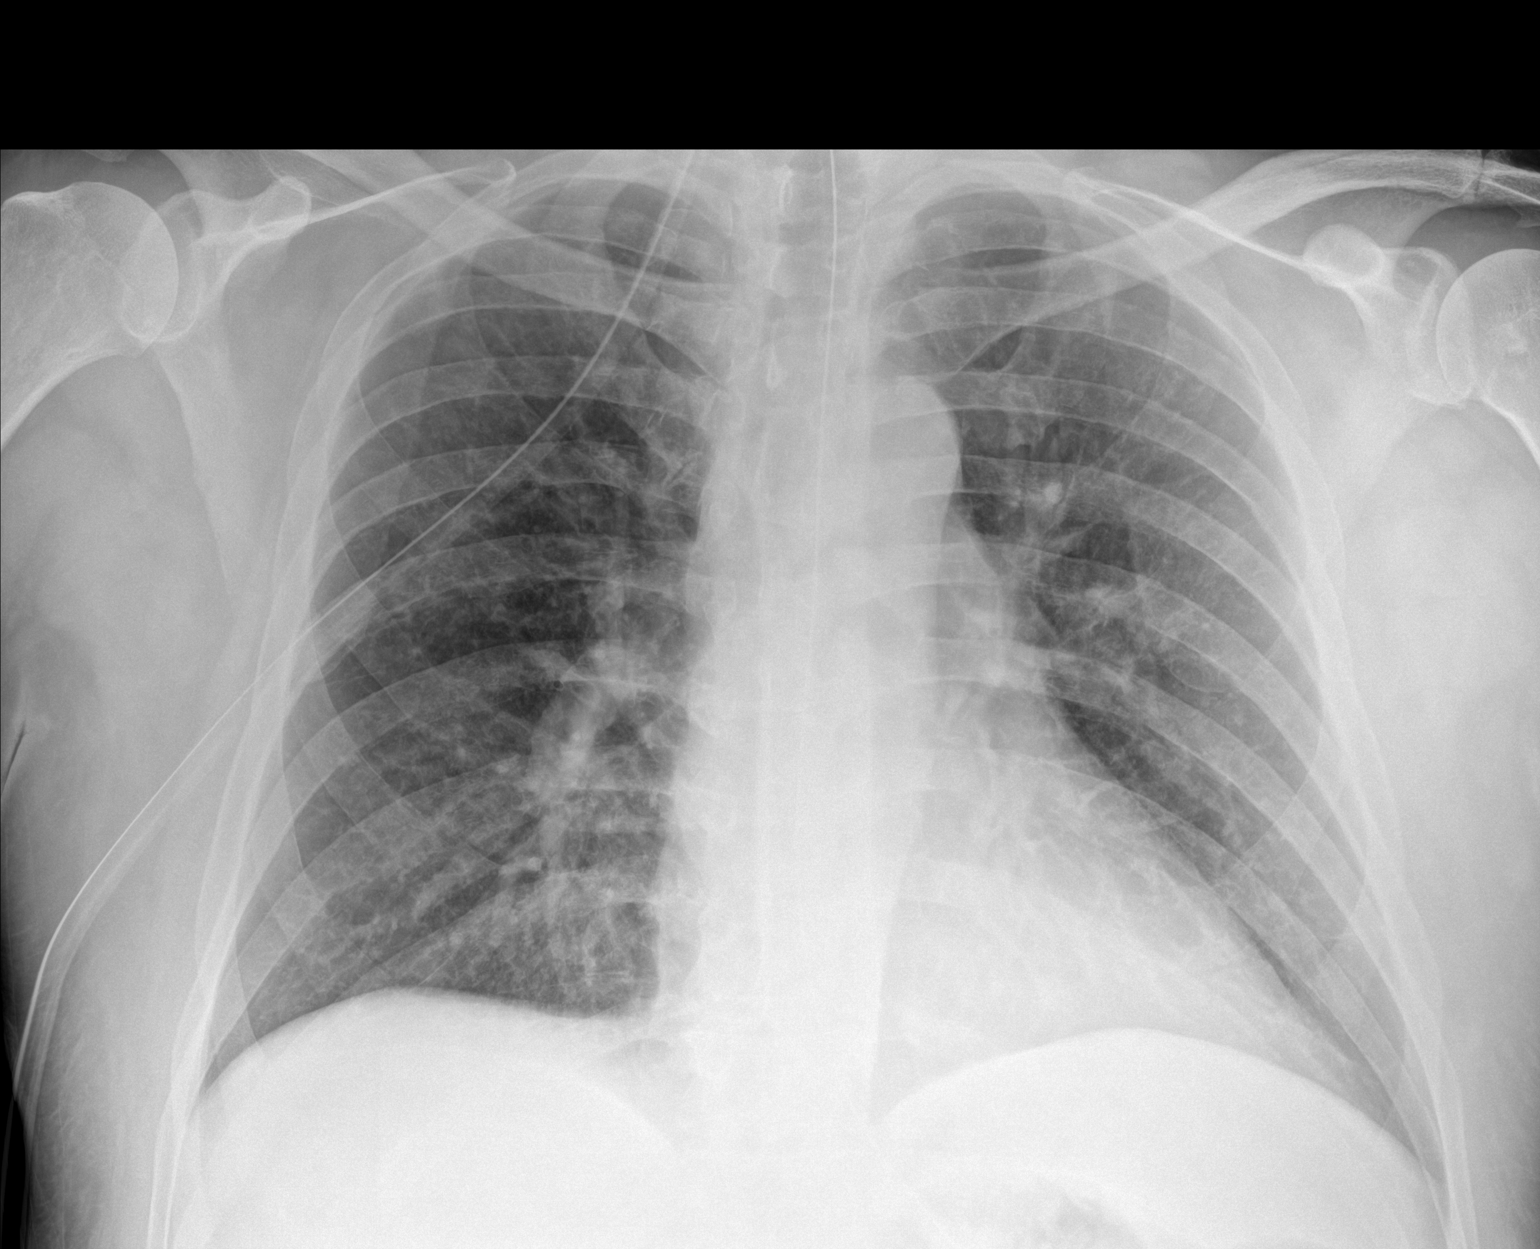

[1 of 1 positions shown; findings below may reference images not displayed]

FINDINGS: There is a nasogastric tube which passes below the diaphragm, tip
excluded by collimation. Unchanged cardiomediastinal silhouette.
There is continued improvement in left lower lung aeration. No new
airspace disease. No pleural effusion. No pneumothorax. No acute
osseous abnormality.
IMPRESSION: Continued improvement in left lower lung aeration. No new airspace
disease.

## 2023-11-26 DIAGNOSIS — G4733 Obstructive sleep apnea (adult) (pediatric): Secondary | ICD-10-CM | POA: Diagnosis not present

## 2023-12-16 DIAGNOSIS — Z125 Encounter for screening for malignant neoplasm of prostate: Secondary | ICD-10-CM | POA: Diagnosis not present

## 2023-12-16 DIAGNOSIS — E785 Hyperlipidemia, unspecified: Secondary | ICD-10-CM | POA: Diagnosis not present

## 2023-12-16 DIAGNOSIS — E1169 Type 2 diabetes mellitus with other specified complication: Secondary | ICD-10-CM | POA: Diagnosis not present

## 2023-12-16 DIAGNOSIS — I159 Secondary hypertension, unspecified: Secondary | ICD-10-CM | POA: Diagnosis not present

## 2023-12-23 DIAGNOSIS — E781 Pure hyperglyceridemia: Secondary | ICD-10-CM | POA: Diagnosis not present

## 2023-12-23 DIAGNOSIS — F331 Major depressive disorder, recurrent, moderate: Secondary | ICD-10-CM | POA: Diagnosis not present

## 2023-12-23 DIAGNOSIS — E1169 Type 2 diabetes mellitus with other specified complication: Secondary | ICD-10-CM | POA: Diagnosis not present

## 2023-12-23 DIAGNOSIS — I159 Secondary hypertension, unspecified: Secondary | ICD-10-CM | POA: Diagnosis not present

## 2023-12-23 DIAGNOSIS — L578 Other skin changes due to chronic exposure to nonionizing radiation: Secondary | ICD-10-CM | POA: Diagnosis not present

## 2023-12-23 DIAGNOSIS — E785 Hyperlipidemia, unspecified: Secondary | ICD-10-CM | POA: Diagnosis not present

## 2023-12-23 DIAGNOSIS — M25522 Pain in left elbow: Secondary | ICD-10-CM | POA: Diagnosis not present

## 2023-12-23 DIAGNOSIS — M79672 Pain in left foot: Secondary | ICD-10-CM | POA: Diagnosis not present

## 2023-12-23 DIAGNOSIS — Z1331 Encounter for screening for depression: Secondary | ICD-10-CM | POA: Diagnosis not present

## 2023-12-23 DIAGNOSIS — M79671 Pain in right foot: Secondary | ICD-10-CM | POA: Diagnosis not present

## 2024-01-21 DIAGNOSIS — E119 Type 2 diabetes mellitus without complications: Secondary | ICD-10-CM | POA: Diagnosis not present

## 2024-01-21 DIAGNOSIS — M65979 Unspecified synovitis and tenosynovitis, unspecified ankle and foot: Secondary | ICD-10-CM | POA: Diagnosis not present

## 2024-01-21 DIAGNOSIS — M722 Plantar fascial fibromatosis: Secondary | ICD-10-CM | POA: Diagnosis not present

## 2024-01-21 DIAGNOSIS — M65872 Other synovitis and tenosynovitis, left ankle and foot: Secondary | ICD-10-CM | POA: Diagnosis not present

## 2024-01-27 DIAGNOSIS — I159 Secondary hypertension, unspecified: Secondary | ICD-10-CM | POA: Diagnosis not present

## 2024-01-27 DIAGNOSIS — E1169 Type 2 diabetes mellitus with other specified complication: Secondary | ICD-10-CM | POA: Diagnosis not present

## 2024-01-27 DIAGNOSIS — E785 Hyperlipidemia, unspecified: Secondary | ICD-10-CM | POA: Diagnosis not present

## 2024-01-27 DIAGNOSIS — Z1331 Encounter for screening for depression: Secondary | ICD-10-CM | POA: Diagnosis not present

## 2024-01-27 DIAGNOSIS — F331 Major depressive disorder, recurrent, moderate: Secondary | ICD-10-CM | POA: Diagnosis not present

## 2024-01-27 DIAGNOSIS — F411 Generalized anxiety disorder: Secondary | ICD-10-CM | POA: Diagnosis not present

## 2024-04-25 ENCOUNTER — Other Ambulatory Visit: Payer: Self-pay

## 2024-04-25 ENCOUNTER — Emergency Department: Admission: EM | Admit: 2024-04-25 | Discharge: 2024-04-25 | Disposition: A | Discharge status: Home / Self Care

## 2024-04-25 DIAGNOSIS — E1165 Type 2 diabetes mellitus with hyperglycemia: Secondary | ICD-10-CM | POA: Diagnosis not present

## 2024-04-25 DIAGNOSIS — R739 Hyperglycemia, unspecified: Secondary | ICD-10-CM | POA: Diagnosis present

## 2024-04-25 DIAGNOSIS — Z7984 Long term (current) use of oral hypoglycemic drugs: Secondary | ICD-10-CM | POA: Insufficient documentation

## 2024-04-25 DIAGNOSIS — E11A Type 2 diabetes mellitus without complications in remission: Secondary | ICD-10-CM

## 2024-04-25 LAB — CBC
HCT: 48.9 % (ref 39.0–52.0)
Hemoglobin: 17.2 g/dL — ABNORMAL HIGH (ref 13.0–17.0)
MCH: 31.2 pg (ref 26.0–34.0)
MCHC: 35.2 g/dL (ref 30.0–36.0)
MCV: 88.7 fL (ref 80.0–100.0)
Platelets: 188 K/uL (ref 150–400)
RBC: 5.51 MIL/uL (ref 4.22–5.81)
RDW: 11.7 % (ref 11.5–15.5)
WBC: 7.9 K/uL (ref 4.0–10.5)
nRBC: 0 % (ref 0.0–0.2)

## 2024-04-25 LAB — BASIC METABOLIC PANEL WITH GFR
Anion gap: 13 (ref 5–15)
BUN: 22 mg/dL — ABNORMAL HIGH (ref 6–20)
CO2: 22 mmol/L (ref 22–32)
Calcium: 9.4 mg/dL (ref 8.9–10.3)
Chloride: 97 mmol/L — ABNORMAL LOW (ref 98–111)
Creatinine, Ser: 1.03 mg/dL (ref 0.61–1.24)
GFR, Estimated: >60 mL/min
Glucose, Bld: 585 mg/dL (ref 70–99)
Potassium: 4.6 mmol/L (ref 3.5–5.1)
Sodium: 131 mmol/L — ABNORMAL LOW (ref 135–145)

## 2024-04-25 LAB — URINALYSIS, ROUTINE W REFLEX MICROSCOPIC
Bacteria, UA: NONE SEEN
Bilirubin Urine: NEGATIVE
Glucose, UA: >=500 mg/dL — AB
Hgb urine dipstick: NEGATIVE
Ketones, ur: NEGATIVE mg/dL
Leukocytes,Ua: NEGATIVE
Nitrite: NEGATIVE
Protein, ur: NEGATIVE mg/dL
Specific Gravity, Urine: 1.023 (ref 1.005–1.030)
pH: 6 (ref 5.0–8.0)

## 2024-04-25 LAB — CBG MONITORING, ED
Glucose-Capillary: 541 mg/dL (ref 70–99)
Glucose-Capillary: 481 mg/dL — ABNORMAL HIGH (ref 70–99)
Glucose-Capillary: 397 mg/dL — ABNORMAL HIGH (ref 70–99)

## 2024-04-25 MED ORDER — INSULIN GLARGINE 100 UNIT/ML ~~LOC~~ SOLN
10.0000 [IU] | Freq: Once | SUBCUTANEOUS | Status: AC
  Administered 2024-04-25: 10 [IU] via SUBCUTANEOUS
  Filled 2024-04-25: qty 0.1

## 2024-04-25 MED ORDER — SODIUM CHLORIDE 0.9 % IV BOLUS
1000.0000 mL | Freq: Once | INTRAVENOUS | Status: AC
  Administered 2024-04-25: 1000 mL via INTRAVENOUS

## 2024-04-25 NOTE — Discharge Instructions (Signed)
 You were seen today due to concern of high sugar levels.  At this time fortunately your sugar has improved, I would recommend following up with your primary doctor to have this further assessed and evaluated.  If you have any worsening symptoms such as significant nausea vomiting abdominal pain or any other symptoms you find concerning please return to the emergency department immediately for further medical management.

## 2024-04-25 NOTE — ED Triage Notes (Signed)
 Pt presents via POV c/o hyperglycemia. Reports meter at home was high. Reports DMII. Reports compliance with DM meds.

## 2024-04-25 NOTE — ED Provider Notes (Signed)
 "  [facility] Provider Note    Event Date/Time   First MD Initiated Contact with Patient 04/25/24 2155     (approximate)   History   Hyperglycemia   HPI Timothy Cameron is a 59 y.o. male with history of diabetes presenting with concern of hyperglycemia.  The patient informs me over the last few days he has not been feeling so well, and recently having to wake up frequently throughout the night because he has to pee every few minutes.  He also started noticing some blurry vision recently but was not sure what to make of it and states that he is always thirsty.  History of diabetes currently only on glipizide  at home.  He supposed to follow-up with his primary doctor on Wednesday after getting lab work done on Tuesday.  Presents now because his sugar level kept reading as high at home.  Denies any nausea vomiting diarrhea constipation or other symptoms at this time.     Physical Exam   Triage Vital Signs: ED Triage Vitals  Encounter Vitals Group     BP 04/25/24 2101 (!) 149/89     Girls Systolic BP Percentile --      Girls Diastolic BP Percentile --      Boys Systolic BP Percentile --      Boys Diastolic BP Percentile --      Pulse Rate 04/25/24 2101 80     Resp 04/25/24 2101 16     Temp 04/25/24 2101 97.7 F (36.5 C)     Temp Source 04/25/24 2101 Oral     SpO2 04/25/24 2101 97 %     Weight 04/25/24 2102 214 lb (97.1 kg)     Height 04/25/24 2102 5' 11 (1.803 m)     Head Circumference --      Peak Flow --      Pain Score 04/25/24 2102 0     Pain Loc --      Pain Education --      Exclude from Growth Chart --     Most recent vital signs: Vitals:   04/25/24 2230 04/25/24 2300  BP: 137/80 125/77  Pulse: 64 63  Resp: 14 17  Temp:    SpO2: 99% 100%     General: Awake, no distress.  CV:  Good peripheral perfusion.  Resp:  Normal effort.  Abd:  No distention.  Soft nontender Other:     ED Results / Procedures / Treatments   Labs (all  labs ordered are listed, but only abnormal results are displayed) Labs Reviewed  BASIC METABOLIC PANEL WITH GFR - Abnormal; Notable for the following components:      Result Value   Sodium 131 (*)    Chloride 97 (*)    Glucose, Bld 585 (*)    BUN 22 (*)    All other components within normal limits  CBC - Abnormal; Notable for the following components:   Hemoglobin 17.2 (*)    All other components within normal limits  URINALYSIS, ROUTINE W REFLEX MICROSCOPIC - Abnormal; Notable for the following components:   Color, Urine STRAW (*)    APPearance CLEAR (*)    Glucose, UA >=500 (*)    All other components within normal limits  CBG MONITORING, ED - Abnormal; Notable for the following components:   Glucose-Capillary 541 (*)    All other components within normal limits  CBG MONITORING, ED - Abnormal; Notable for the following components:   Glucose-Capillary 481 (*)  All other components within normal limits  CBG MONITORING, ED - Abnormal; Notable for the following components:   Glucose-Capillary 397 (*)    All other components within normal limits     EKG     RADIOLOGY   PROCEDURES:  Critical Care performed: Yes, see critical care procedure note(s)  .Critical Care  Performed by: Fernand Rossie HERO, MD Authorized by: Fernand Rossie HERO, MD   Critical care provider statement:    Critical care time (minutes):  30   Critical care was time spent personally by me on the following activities:  Development of treatment plan with patient or surrogate, discussions with consultants, evaluation of patient's response to treatment, examination of patient, ordering and review of laboratory studies, ordering and performing treatments and interventions, pulse oximetry and re-evaluation of patient's condition    MEDICATIONS ORDERED IN ED: Medications  sodium chloride  0.9 % bolus 1,000 mL (0 mLs Intravenous Stopped 04/25/24 2222)  insulin  glargine (LANTUS ) injection 10 Units (10 Units  Subcutaneous Given 04/25/24 2230)  sodium chloride  0.9 % bolus 1,000 mL (0 mLs Intravenous Stopped 04/25/24 2256)     IMPRESSION / MDM / ASSESSMENT AND PLAN / ED COURSE  I reviewed the triage vital signs and the nursing notes.                               Patient's presentation is most consistent with acute presentation with potential threat to life or bodily function.  A 59 year old male with significant hyperglycemia presenting with concern of visual changes and polydipsia and polyuria.  He appears well he is not in any acute distress his vitals are fortunately reassuring unfortunately his sugar level is very high here.  We give a dose of insulin  and fluid resuscitate and continue to monitor.  His glucose is improving appropriately, I believe reasonable for discharge.  He does not appear to be in DKA or HHS, he is answering questions appropriately mentating well.  He has normal anion gap here.   Clinical Course as of 04/25/24 2344  [place] Apr 25, 2024  2331 Patient symptoms significantly improved, glucose also improved now, he appears well he is not in any acute distress will have him discharged home at this time, discussed return precautions I considered admission as well as new antihyperglycemic medication, he has an appointment and follow-up with his primary doctor on Tuesday, he is amenable to hold off on this at this time and he feels reasonable for discharge home. [SK]    Clinical Course User Index [SK] Fernand Rossie HERO, MD     FINAL CLINICAL IMPRESSION(S) / ED DIAGNOSES   Final diagnoses:  Hyperglycemia  Type 2 diabetes mellitus without complication in remission     Rx / DC Orders   ED Discharge Orders     None        Note:  This document was prepared using Dragon voice recognition software and may include unintentional dictation errors.   Fernand Rossie HERO, MD 04/25/24 2344  "

## 2024-04-25 NOTE — ED Notes (Addendum)
CBG 481
